# Patient Record
Sex: Female | Born: 1955 | Race: White | Hispanic: No | Marital: Married | Smoking: Current every day smoker
Health system: Southern US, Community
[De-identification: ages and names within clinical notes are randomized; demographics above are authoritative.]

## PROBLEM LIST (undated history)

## (undated) DIAGNOSIS — M549 Dorsalgia, unspecified: Secondary | ICD-10-CM

## (undated) DIAGNOSIS — Z8709 Personal history of other diseases of the respiratory system: Secondary | ICD-10-CM

## (undated) DIAGNOSIS — K219 Gastro-esophageal reflux disease without esophagitis: Secondary | ICD-10-CM

## (undated) DIAGNOSIS — E876 Hypokalemia: Secondary | ICD-10-CM

## (undated) DIAGNOSIS — G8929 Other chronic pain: Secondary | ICD-10-CM

## (undated) DIAGNOSIS — E785 Hyperlipidemia, unspecified: Secondary | ICD-10-CM

## (undated) DIAGNOSIS — L309 Dermatitis, unspecified: Secondary | ICD-10-CM

## (undated) DIAGNOSIS — I341 Nonrheumatic mitral (valve) prolapse: Secondary | ICD-10-CM

## (undated) DIAGNOSIS — I471 Supraventricular tachycardia, unspecified: Secondary | ICD-10-CM

## (undated) DIAGNOSIS — F419 Anxiety disorder, unspecified: Secondary | ICD-10-CM

## (undated) DIAGNOSIS — Z8632 Personal history of gestational diabetes: Secondary | ICD-10-CM

## (undated) DIAGNOSIS — I10 Essential (primary) hypertension: Secondary | ICD-10-CM

## (undated) DIAGNOSIS — L509 Urticaria, unspecified: Secondary | ICD-10-CM

## (undated) DIAGNOSIS — D751 Secondary polycythemia: Secondary | ICD-10-CM

## (undated) DIAGNOSIS — R232 Flushing: Secondary | ICD-10-CM

## (undated) HISTORY — DX: Gastro-esophageal reflux disease without esophagitis: K21.9

## (undated) HISTORY — DX: Hyperlipidemia, unspecified: E78.5

## (undated) HISTORY — DX: Dermatitis, unspecified: L30.9

## (undated) HISTORY — DX: Urticaria, unspecified: L50.9

## (undated) HISTORY — DX: Hypokalemia: E87.6

## (undated) HISTORY — DX: Flushing: R23.2

## (undated) HISTORY — DX: Supraventricular tachycardia, unspecified: I47.10

## (undated) HISTORY — DX: Anxiety disorder, unspecified: F41.9

## (undated) HISTORY — DX: Other chronic pain: G89.29

## (undated) HISTORY — PX: OTHER SURGICAL HISTORY: SHX169

## (undated) HISTORY — DX: Secondary polycythemia: D75.1

## (undated) HISTORY — DX: Personal history of gestational diabetes: Z86.32

## (undated) HISTORY — DX: Dorsalgia, unspecified: M54.9

## (undated) HISTORY — DX: Nonrheumatic mitral (valve) prolapse: I34.1

## (undated) HISTORY — DX: Personal history of other diseases of the respiratory system: Z87.09

## (undated) HISTORY — DX: Essential (primary) hypertension: I10

## (undated) HISTORY — DX: Supraventricular tachycardia: I47.1

---

## 2001-11-09 ENCOUNTER — Encounter: Payer: Self-pay | Admitting: Emergency Medicine

## 2001-11-09 ENCOUNTER — Emergency Department (HOSPITAL_COMMUNITY): Admission: EM | Admit: 2001-11-09 | Discharge: 2001-11-09 | Payer: Self-pay | Admitting: Emergency Medicine

## 2001-12-21 ENCOUNTER — Encounter: Payer: Self-pay | Admitting: Emergency Medicine

## 2001-12-21 ENCOUNTER — Emergency Department (HOSPITAL_COMMUNITY): Admission: EM | Admit: 2001-12-21 | Discharge: 2001-12-21 | Payer: Self-pay | Admitting: Emergency Medicine

## 2001-12-24 ENCOUNTER — Encounter: Payer: Self-pay | Admitting: Emergency Medicine

## 2001-12-24 ENCOUNTER — Emergency Department (HOSPITAL_COMMUNITY): Admission: EM | Admit: 2001-12-24 | Discharge: 2001-12-24 | Payer: Self-pay | Admitting: Emergency Medicine

## 2002-07-30 ENCOUNTER — Encounter: Payer: Self-pay | Admitting: Emergency Medicine

## 2002-07-30 ENCOUNTER — Emergency Department (HOSPITAL_COMMUNITY): Admission: EM | Admit: 2002-07-30 | Discharge: 2002-07-30 | Payer: Self-pay | Admitting: Emergency Medicine

## 2002-10-07 ENCOUNTER — Encounter (INDEPENDENT_AMBULATORY_CARE_PROVIDER_SITE_OTHER): Payer: Self-pay

## 2002-10-07 ENCOUNTER — Other Ambulatory Visit: Admission: RE | Admit: 2002-10-07 | Discharge: 2002-10-07 | Payer: Self-pay | Admitting: *Deleted

## 2002-10-07 ENCOUNTER — Encounter: Admission: RE | Admit: 2002-10-07 | Discharge: 2002-10-07 | Payer: Self-pay | Admitting: Family Medicine

## 2003-09-01 ENCOUNTER — Emergency Department (HOSPITAL_COMMUNITY): Admission: EM | Admit: 2003-09-01 | Discharge: 2003-09-01 | Payer: Self-pay | Admitting: *Deleted

## 2003-09-16 ENCOUNTER — Emergency Department (HOSPITAL_COMMUNITY): Admission: EM | Admit: 2003-09-16 | Discharge: 2003-09-16 | Payer: Self-pay | Admitting: Emergency Medicine

## 2004-12-23 ENCOUNTER — Emergency Department (HOSPITAL_COMMUNITY): Admission: EM | Admit: 2004-12-23 | Discharge: 2004-12-23 | Payer: Self-pay | Admitting: Emergency Medicine

## 2005-01-15 ENCOUNTER — Ambulatory Visit: Payer: Self-pay | Admitting: Hospitalist

## 2005-02-05 ENCOUNTER — Ambulatory Visit: Payer: Self-pay | Admitting: Hospitalist

## 2005-02-19 ENCOUNTER — Ambulatory Visit: Payer: Self-pay | Admitting: Internal Medicine

## 2005-03-25 ENCOUNTER — Ambulatory Visit: Payer: Self-pay | Admitting: Hospitalist

## 2005-03-25 ENCOUNTER — Ambulatory Visit (HOSPITAL_COMMUNITY): Admission: RE | Admit: 2005-03-25 | Discharge: 2005-03-25 | Payer: Self-pay | Admitting: Hospitalist

## 2005-04-08 ENCOUNTER — Ambulatory Visit: Payer: Self-pay | Admitting: Hospitalist

## 2005-04-11 ENCOUNTER — Ambulatory Visit (HOSPITAL_COMMUNITY): Admission: RE | Admit: 2005-04-11 | Discharge: 2005-04-11 | Payer: Self-pay | Admitting: Sports Medicine

## 2005-04-16 ENCOUNTER — Ambulatory Visit: Payer: Self-pay | Admitting: Hospitalist

## 2005-04-17 ENCOUNTER — Ambulatory Visit: Payer: Self-pay | Admitting: Internal Medicine

## 2005-05-06 ENCOUNTER — Ambulatory Visit: Payer: Self-pay | Admitting: Hospitalist

## 2005-08-06 ENCOUNTER — Emergency Department (HOSPITAL_COMMUNITY): Admission: EM | Admit: 2005-08-06 | Discharge: 2005-08-06 | Payer: Self-pay | Admitting: Family Medicine

## 2005-08-08 ENCOUNTER — Ambulatory Visit: Payer: Self-pay | Admitting: Hospitalist

## 2005-09-04 ENCOUNTER — Ambulatory Visit: Payer: Self-pay | Admitting: Internal Medicine

## 2005-10-03 ENCOUNTER — Ambulatory Visit: Payer: Self-pay | Admitting: Internal Medicine

## 2005-10-26 DIAGNOSIS — I059 Rheumatic mitral valve disease, unspecified: Secondary | ICD-10-CM | POA: Insufficient documentation

## 2005-10-26 DIAGNOSIS — D751 Secondary polycythemia: Secondary | ICD-10-CM

## 2005-10-26 DIAGNOSIS — K219 Gastro-esophageal reflux disease without esophagitis: Secondary | ICD-10-CM | POA: Insufficient documentation

## 2005-10-26 DIAGNOSIS — F17200 Nicotine dependence, unspecified, uncomplicated: Secondary | ICD-10-CM | POA: Insufficient documentation

## 2005-10-26 DIAGNOSIS — E876 Hypokalemia: Secondary | ICD-10-CM | POA: Insufficient documentation

## 2005-10-26 DIAGNOSIS — I10 Essential (primary) hypertension: Secondary | ICD-10-CM | POA: Insufficient documentation

## 2005-12-09 ENCOUNTER — Ambulatory Visit: Payer: Self-pay | Admitting: Internal Medicine

## 2005-12-09 ENCOUNTER — Ambulatory Visit (HOSPITAL_COMMUNITY): Admission: RE | Admit: 2005-12-09 | Discharge: 2005-12-09 | Payer: Self-pay | Admitting: Internal Medicine

## 2005-12-09 ENCOUNTER — Encounter (INDEPENDENT_AMBULATORY_CARE_PROVIDER_SITE_OTHER): Payer: Self-pay | Admitting: Infectious Diseases

## 2005-12-09 LAB — CONVERTED CEMR LAB
BUN: 7 mg/dL (ref 6–23)
CO2: 30 meq/L (ref 19–32)
Chloride: 99 meq/L (ref 96–112)
Creatinine, Ser: 0.76 mg/dL (ref 0.40–1.20)
Glucose, Bld: 114 mg/dL — ABNORMAL HIGH (ref 70–99)

## 2005-12-26 ENCOUNTER — Ambulatory Visit: Payer: Self-pay | Admitting: *Deleted

## 2006-01-30 DIAGNOSIS — F341 Dysthymic disorder: Secondary | ICD-10-CM

## 2006-04-06 ENCOUNTER — Emergency Department (HOSPITAL_COMMUNITY): Admission: EM | Admit: 2006-04-06 | Discharge: 2006-04-07 | Payer: Self-pay | Admitting: Emergency Medicine

## 2006-04-28 ENCOUNTER — Encounter (INDEPENDENT_AMBULATORY_CARE_PROVIDER_SITE_OTHER): Payer: Self-pay | Admitting: Internal Medicine

## 2006-04-28 ENCOUNTER — Ambulatory Visit: Payer: Self-pay | Admitting: Internal Medicine

## 2006-04-28 LAB — CONVERTED CEMR LAB
CO2: 26 meq/L (ref 19–32)
Calcium: 9.5 mg/dL (ref 8.4–10.5)
Chloride: 92 meq/L — ABNORMAL LOW (ref 96–112)
Glucose, Bld: 79 mg/dL (ref 70–99)

## 2006-04-30 ENCOUNTER — Telehealth (INDEPENDENT_AMBULATORY_CARE_PROVIDER_SITE_OTHER): Payer: Self-pay | Admitting: Internal Medicine

## 2006-05-08 ENCOUNTER — Ambulatory Visit: Payer: Self-pay | Admitting: Internal Medicine

## 2006-05-08 ENCOUNTER — Encounter (INDEPENDENT_AMBULATORY_CARE_PROVIDER_SITE_OTHER): Payer: Self-pay | Admitting: Internal Medicine

## 2006-05-08 LAB — CONVERTED CEMR LAB
BUN: 6 mg/dL (ref 6–23)
CO2: 29 meq/L (ref 19–32)
Calcium: 9.3 mg/dL (ref 8.4–10.5)
Chloride: 102 meq/L (ref 96–112)
Creatinine, Ser: 0.75 mg/dL (ref 0.40–1.20)
Glucose, Bld: 89 mg/dL (ref 70–99)
Potassium: 4.4 meq/L (ref 3.5–5.3)
Sodium: 135 meq/L (ref 135–145)

## 2006-09-02 ENCOUNTER — Encounter: Payer: Self-pay | Admitting: Infectious Disease

## 2006-09-02 ENCOUNTER — Encounter (INDEPENDENT_AMBULATORY_CARE_PROVIDER_SITE_OTHER): Payer: Self-pay | Admitting: Internal Medicine

## 2006-09-02 ENCOUNTER — Ambulatory Visit: Payer: Self-pay | Admitting: Infectious Disease

## 2006-09-02 ENCOUNTER — Ambulatory Visit (HOSPITAL_COMMUNITY): Admission: RE | Admit: 2006-09-02 | Discharge: 2006-09-02 | Payer: Self-pay | Admitting: Infectious Disease

## 2006-09-02 ENCOUNTER — Telehealth: Payer: Self-pay | Admitting: *Deleted

## 2006-09-02 ENCOUNTER — Ambulatory Visit: Payer: Self-pay | Admitting: Surgery

## 2006-09-02 DIAGNOSIS — R609 Edema, unspecified: Secondary | ICD-10-CM

## 2006-09-02 LAB — CONVERTED CEMR LAB
BUN: 10 mg/dL (ref 6–23)
Basophils Relative: 0 % (ref 0–1)
Calcium: 9.6 mg/dL (ref 8.4–10.5)
Chloride: 96 meq/L (ref 96–112)
Eosinophils Relative: 0 % (ref 0–5)
Lymphocytes Relative: 18 % (ref 12–46)
MCV: 94.3 fL (ref 78.0–100.0)
Monocytes Absolute: 0.5 10*3/uL (ref 0.2–0.7)
Neutro Abs: 6 10*3/uL (ref 1.7–7.7)
Platelets: 259 10*3/uL (ref 150–400)
Potassium: 3.6 meq/L (ref 3.5–5.3)
Sodium: 134 meq/L — ABNORMAL LOW (ref 135–145)
Total CK: 71 units/L (ref 7–177)

## 2006-10-06 ENCOUNTER — Ambulatory Visit: Payer: Self-pay | Admitting: Internal Medicine

## 2006-10-23 ENCOUNTER — Ambulatory Visit: Payer: Self-pay | Admitting: Hospitalist

## 2006-10-23 DIAGNOSIS — R05 Cough: Secondary | ICD-10-CM | POA: Insufficient documentation

## 2006-11-18 ENCOUNTER — Ambulatory Visit: Payer: Self-pay | Admitting: *Deleted

## 2006-12-25 ENCOUNTER — Ambulatory Visit: Payer: Self-pay | Admitting: Hospitalist

## 2006-12-25 ENCOUNTER — Encounter (INDEPENDENT_AMBULATORY_CARE_PROVIDER_SITE_OTHER): Payer: Self-pay | Admitting: *Deleted

## 2006-12-25 ENCOUNTER — Ambulatory Visit (HOSPITAL_COMMUNITY): Admission: RE | Admit: 2006-12-25 | Discharge: 2006-12-25 | Payer: Self-pay | Admitting: Internal Medicine

## 2006-12-25 ENCOUNTER — Telehealth: Payer: Self-pay | Admitting: *Deleted

## 2007-01-16 ENCOUNTER — Ambulatory Visit: Payer: Self-pay | Admitting: Internal Medicine

## 2007-01-16 ENCOUNTER — Encounter (INDEPENDENT_AMBULATORY_CARE_PROVIDER_SITE_OTHER): Payer: Self-pay | Admitting: Internal Medicine

## 2007-01-18 LAB — CONVERTED CEMR LAB
BUN: 7 mg/dL (ref 6–23)
CO2: 24 meq/L (ref 19–32)
Cholesterol: 265 mg/dL — ABNORMAL HIGH (ref 0–200)
Creatinine, Ser: 0.69 mg/dL (ref 0.40–1.20)
Glucose, Bld: 80 mg/dL (ref 70–99)
LDL Cholesterol: 125 mg/dL — ABNORMAL HIGH (ref 0–99)
Total CHOL/HDL Ratio: 2.1
VLDL: 14 mg/dL (ref 0–40)

## 2007-05-01 ENCOUNTER — Emergency Department (HOSPITAL_COMMUNITY): Admission: EM | Admit: 2007-05-01 | Discharge: 2007-05-01 | Payer: Self-pay | Admitting: Emergency Medicine

## 2007-05-08 ENCOUNTER — Ambulatory Visit: Payer: Self-pay | Admitting: Hospitalist

## 2007-05-08 DIAGNOSIS — Z8679 Personal history of other diseases of the circulatory system: Secondary | ICD-10-CM

## 2007-06-19 ENCOUNTER — Encounter (INDEPENDENT_AMBULATORY_CARE_PROVIDER_SITE_OTHER): Payer: Self-pay | Admitting: Internal Medicine

## 2007-06-19 ENCOUNTER — Ambulatory Visit: Payer: Self-pay | Admitting: Internal Medicine

## 2007-06-19 DIAGNOSIS — E785 Hyperlipidemia, unspecified: Secondary | ICD-10-CM | POA: Insufficient documentation

## 2007-06-19 LAB — CONVERTED CEMR LAB
ALT: 14 units/L (ref 0–35)
Albumin: 4.3 g/dL (ref 3.5–5.2)
Alkaline Phosphatase: 61 units/L (ref 39–117)
BUN: 11 mg/dL (ref 6–23)
CO2: 26 meq/L (ref 19–32)
Calcium: 9.5 mg/dL (ref 8.4–10.5)
Chloride: 101 meq/L (ref 96–112)
Eosinophils Absolute: 0.2 10*3/uL (ref 0.0–0.7)
Glucose, Bld: 82 mg/dL (ref 70–99)
Lymphs Abs: 2.5 10*3/uL (ref 0.7–4.0)
Monocytes Absolute: 0.6 10*3/uL (ref 0.1–1.0)
Monocytes Relative: 7 % (ref 3–12)
RBC: 4.8 M/uL (ref 3.87–5.11)
TSH: 1.684 microintl units/mL (ref 0.350–5.50)
Total Bilirubin: 0.5 mg/dL (ref 0.3–1.2)
WBC: 8.8 10*3/uL (ref 4.0–10.5)

## 2007-08-24 ENCOUNTER — Telehealth (INDEPENDENT_AMBULATORY_CARE_PROVIDER_SITE_OTHER): Payer: Self-pay | Admitting: *Deleted

## 2007-09-29 ENCOUNTER — Telehealth (INDEPENDENT_AMBULATORY_CARE_PROVIDER_SITE_OTHER): Payer: Self-pay | Admitting: Internal Medicine

## 2007-09-29 ENCOUNTER — Encounter (INDEPENDENT_AMBULATORY_CARE_PROVIDER_SITE_OTHER): Payer: Self-pay | Admitting: Internal Medicine

## 2007-10-02 ENCOUNTER — Encounter: Payer: Self-pay | Admitting: Licensed Clinical Social Worker

## 2007-10-02 ENCOUNTER — Ambulatory Visit: Payer: Self-pay | Admitting: Internal Medicine

## 2007-10-02 DIAGNOSIS — T7411XA Adult physical abuse, confirmed, initial encounter: Secondary | ICD-10-CM

## 2007-10-07 ENCOUNTER — Telehealth (INDEPENDENT_AMBULATORY_CARE_PROVIDER_SITE_OTHER): Payer: Self-pay | Admitting: Internal Medicine

## 2007-10-09 ENCOUNTER — Encounter: Payer: Self-pay | Admitting: Licensed Clinical Social Worker

## 2008-02-03 ENCOUNTER — Ambulatory Visit: Payer: Self-pay | Admitting: Internal Medicine

## 2008-02-03 ENCOUNTER — Encounter: Payer: Self-pay | Admitting: Internal Medicine

## 2008-02-05 LAB — CONVERTED CEMR LAB
BUN: 6 mg/dL (ref 6–23)
Calcium: 9.6 mg/dL (ref 8.4–10.5)
Creatinine, Ser: 0.74 mg/dL (ref 0.40–1.20)
Potassium: 3.4 meq/L — ABNORMAL LOW (ref 3.5–5.3)
Sodium: 140 meq/L (ref 135–145)

## 2008-02-15 ENCOUNTER — Telehealth (INDEPENDENT_AMBULATORY_CARE_PROVIDER_SITE_OTHER): Payer: Self-pay | Admitting: Internal Medicine

## 2008-02-24 ENCOUNTER — Ambulatory Visit: Payer: Self-pay | Admitting: Internal Medicine

## 2008-02-24 LAB — CONVERTED CEMR LAB
BUN: 9 mg/dL (ref 6–23)
Chloride: 99 meq/L (ref 96–112)
Glucose, Bld: 103 mg/dL — ABNORMAL HIGH (ref 70–99)
Potassium: 4.3 meq/L (ref 3.5–5.3)

## 2008-03-31 ENCOUNTER — Encounter (INDEPENDENT_AMBULATORY_CARE_PROVIDER_SITE_OTHER): Payer: Self-pay | Admitting: Internal Medicine

## 2008-03-31 ENCOUNTER — Ambulatory Visit: Payer: Self-pay | Admitting: Internal Medicine

## 2008-03-31 LAB — CONVERTED CEMR LAB
BUN: 13 mg/dL (ref 6–23)
CO2: 25 meq/L (ref 19–32)
Cholesterol: 206 mg/dL — ABNORMAL HIGH (ref 0–200)
Creatinine, Ser: 0.72 mg/dL (ref 0.40–1.20)
Glucose, Bld: 103 mg/dL — ABNORMAL HIGH (ref 70–99)
HDL: 81 mg/dL (ref >39)
Total CHOL/HDL Ratio: 2.5
Triglycerides: 101 mg/dL (ref <150)

## 2008-04-18 ENCOUNTER — Telehealth (INDEPENDENT_AMBULATORY_CARE_PROVIDER_SITE_OTHER): Payer: Self-pay | Admitting: Internal Medicine

## 2008-10-05 ENCOUNTER — Ambulatory Visit: Payer: Self-pay | Admitting: Infectious Diseases

## 2008-10-07 ENCOUNTER — Telehealth: Payer: Self-pay | Admitting: Licensed Clinical Social Worker

## 2008-10-14 LAB — CONVERTED CEMR LAB
BUN: 14 mg/dL (ref 6–23)
CO2: 27 meq/L (ref 19–32)
Calcium: 9.5 mg/dL (ref 8.4–10.5)
Potassium: 3.4 meq/L — ABNORMAL LOW (ref 3.5–5.3)

## 2009-01-11 ENCOUNTER — Telehealth: Payer: Self-pay | Admitting: Internal Medicine

## 2009-01-19 ENCOUNTER — Telehealth: Payer: Self-pay | Admitting: *Deleted

## 2009-02-02 ENCOUNTER — Ambulatory Visit (HOSPITAL_COMMUNITY): Admission: RE | Admit: 2009-02-02 | Discharge: 2009-02-02 | Payer: Self-pay | Admitting: Internal Medicine

## 2009-05-02 ENCOUNTER — Telehealth (INDEPENDENT_AMBULATORY_CARE_PROVIDER_SITE_OTHER): Payer: Self-pay | Admitting: Internal Medicine

## 2009-05-22 ENCOUNTER — Telehealth (INDEPENDENT_AMBULATORY_CARE_PROVIDER_SITE_OTHER): Payer: Self-pay | Admitting: Internal Medicine

## 2009-06-01 ENCOUNTER — Ambulatory Visit: Payer: Self-pay | Admitting: Internal Medicine

## 2009-10-27 ENCOUNTER — Telehealth: Payer: Self-pay | Admitting: Internal Medicine

## 2009-11-22 ENCOUNTER — Ambulatory Visit: Payer: Self-pay | Admitting: Internal Medicine

## 2009-11-22 LAB — CONVERTED CEMR LAB
Calcium: 9.4 mg/dL (ref 8.4–10.5)
Chloride: 99 meq/L (ref 96–112)
Cholesterol: 244 mg/dL — ABNORMAL HIGH (ref 0–200)
Creatinine, Ser: 0.91 mg/dL (ref 0.40–1.20)
HCT: 45.6 % (ref 36.0–46.0)
Potassium: 3.7 meq/L (ref 3.5–5.3)
RBC: 4.82 M/uL (ref 3.87–5.11)
Sodium: 136 meq/L (ref 135–145)
Total CHOL/HDL Ratio: 2.5
Triglycerides: 131 mg/dL (ref <150)
WBC: 9.7 10*3/uL (ref 4.0–10.5)

## 2010-01-28 ENCOUNTER — Encounter: Payer: Self-pay | Admitting: Internal Medicine

## 2010-02-06 NOTE — Progress Notes (Signed)
Summary: REfill/gh  Phone Note Refill Request Message from:  Patient on May 22, 2009 12:57 PM  Refills Requested: Medication #1:  COZAAR 50 MG  TABS Take 1 tablet by mouth once a day Pt 's last office visit  and labs were 09/27/1999.  Pt says she has been out of the medication for 3 days.    Method Requested: Electronic Initial call taken by: Angelina Ok RN,  May 22, 2009 12:57 PM  Follow-up for Phone Call        Rx written.She does need to make fu appt in [facility] though Follow-up by: Acey Lav MD,  May 22, 2009 3:09 PM    Prescriptions: COZAAR 50 MG  TABS (LOSARTAN POTASSIUM) Take 1 tablet by mouth once a day  #30 x 11   Entered and Authorized by:   Acey Lav MD   Signed by:   Paulette Blanch Dam MD on 05/22/2009   Method used:   Electronically to        [facility] (retail)       [address]       [place], [place]  [address]       Ph: [phone]       Fax: [phone]   RxID:   [id number]

## 2010-02-06 NOTE — Progress Notes (Signed)
Summary: refill/gg  Phone Note Refill Request  on May 02, 2009 12:01 PM  Refills Requested: Medication #1:  MAXZIDE-25 37.5-25 MG TABS Take 1 tablet by mouth once a day   Last Refilled: 03/31/2009  Method Requested: Electronic Initial call taken by: Merrie Roof RN,  May 02, 2009 12:01 PM  Follow-up for Phone Call       Follow-up by: Silvestre Gunner MD,  May 02, 2009 2:15 PM    Prescriptions: MAXZIDE-25 37.5-25 MG TABS (TRIAMTERENE-HCTZ) Take 1 tablet by mouth once a day  #30 x 5   Entered and Authorized by:   Silvestre Gunner MD   Signed by:   Silvestre Gunner MD on 05/02/2009   Method used:   Electronically to        [facility] (retail)       [address]       [place], [place]  [address]       Ph: [phone]       Fax: [phone]   RxID:   [id number]
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
## 2010-02-06 NOTE — Progress Notes (Signed)
Summary: med refill/gp  Phone Note Refill Request Message from:  Fax from Pharmacy on January 11, 2009 2:25 PM  Refills Requested: Medication #1:  ATIVAN 1 MG TABS Take 1 tablet by mouth one time a day as needed   Supply Requested: 3 months   Last Refilled: 08/24/2008  Method Requested: Electronic Initial call taken by: Chinita Pester RN,  January 11, 2009 2:25 PM  Follow-up for Phone Call        Last refilled in August per our records after an increase in her paxil dose.  Presumably this SSRI dose increase was effective and she only needed 60 ativan tablets in four months.  I will therefore decrease the dose to ativan 1 mg by mouth Q24H as needed anxiety, dispense # 30.  She will need a follow-up appointment with Dr. Tobie Lords within the next 2 months to assess if the ativan is still necessary or could be further tapered down.  Further refills will require a visit for evaluation.  Please schedule this follow-up appointment. Follow-up by: Doneen Poisson MD,  January 11, 2009 3:02 PM  Additional Follow-up for Phone Call Additional follow up Details #1::        Flag sent to [facility] for an appt. [facility] Additional Follow-up by: Chinita Pester RN,  January 11, 2009 5:21 PM    New/Updated Medications: LORAZEPAM 1 MG TABS (LORAZEPAM) 1 tablet once a day as needed for breakthrough anxiety Prescriptions: LORAZEPAM 1 MG TABS (LORAZEPAM) 1 tablet once a day as needed for breakthrough anxiety  #30 x 1   Entered and Authorized by:   Doneen Poisson MD   Signed by:   Chinita Pester RN on 01/11/2009   Method used:   Printed then [facility]ed [facility] ...       [facility]  [address] [id number]       [address]       [place], [place]  [address]       Ph: [phone] or [phone]       Fax: [phone]   RxID:   [id number]
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
## 2010-02-06 NOTE — Progress Notes (Signed)
----   Converted from flag ---- ---- 01/19/2009 9:01 AM, Chinita Pester RN wrote:   ---- 01/18/2009 3:47 PM, Shon Hough wrote: I will keep this appointment flag until I get a sch.  ---- 01/18/2009 3:29 PM, Chinita Pester RN wrote: What appt.?  ---- 01/18/2009 3:24 PM, Shon Hough wrote: Will keep this appt until the Feb sch has been  posted to sch/cfb  ---- 01/11/2009 5:20 PM, Chinita Pester RN wrote: Linton Flemings needs an appt w/PCP within next 2 months to evaluate her med. Ativan per Dr. Josem Kaufmann. ------------------------------

## 2010-02-06 NOTE — Progress Notes (Signed)
Summary: refill/gg  Phone Note Refill Request  on October 27, 2009 4:14 PM  Refills Requested: Medication #1:  MAXZIDE-25 37.5-25 MG TABS Take 1 tablet by mouth once a day *Pt is out of med Last visit 5/20   Method Requested: Electronic Initial call taken by: Merrie Roof RN,  October 27, 2009 4:14 PM  Follow-up for Phone Call        Last seen in may and 3 month F/U requested but not sch. I sent a flag to Ms Lissa Hoard to schedule an appt.  Follow-up by: Blanch Media MD,  October 27, 2009 4:15 PM    Prescriptions: Joseph Pierini 37.5-25 MG TABS (TRIAMTERENE-HCTZ) Take 1 tablet by mouth once a day  #30 x 5   Entered and Authorized by:   Blanch Media MD   Signed by:   Blanch Media MD on 10/27/2009   Method used:   Electronically to        [facility] (retail)       [address]       [place], [place]  [address]       Ph: [phone]       Fax: [phone]   RxID:   [id number]

## 2010-04-30 ENCOUNTER — Other Ambulatory Visit: Payer: Self-pay | Admitting: Internal Medicine

## 2010-05-23 ENCOUNTER — Ambulatory Visit (INDEPENDENT_AMBULATORY_CARE_PROVIDER_SITE_OTHER): Payer: Self-pay | Admitting: Internal Medicine

## 2010-05-23 ENCOUNTER — Encounter: Payer: Self-pay | Admitting: Internal Medicine

## 2010-05-23 VITALS — BP 119/76 | HR 83 | Temp 99.5°F | Ht 64.0 in | Wt 156.9 lb

## 2010-05-23 DIAGNOSIS — M549 Dorsalgia, unspecified: Secondary | ICD-10-CM

## 2010-05-23 DIAGNOSIS — G8929 Other chronic pain: Secondary | ICD-10-CM

## 2010-05-23 DIAGNOSIS — K219 Gastro-esophageal reflux disease without esophagitis: Secondary | ICD-10-CM

## 2010-05-23 DIAGNOSIS — F341 Dysthymic disorder: Secondary | ICD-10-CM

## 2010-05-23 DIAGNOSIS — I10 Essential (primary) hypertension: Secondary | ICD-10-CM

## 2010-05-23 MED ORDER — NAPROXEN 375 MG PO TABS: 375.0000 mg | ORAL_TABLET | Freq: Two times a day (BID) | ORAL | Status: DC | PRN

## 2010-05-23 MED ORDER — LOSARTAN POTASSIUM 50 MG PO TABS: 50.0000 mg | ORAL_TABLET | Freq: Every day | ORAL | Status: DC

## 2010-05-23 MED ORDER — OMEPRAZOLE 20 MG PO CPDR: 20.0000 mg | DELAYED_RELEASE_CAPSULE | Freq: Every day | ORAL | Status: DC

## 2010-05-23 MED ORDER — LORAZEPAM 0.5 MG PO TABS: 0.5000 mg | ORAL_TABLET | Freq: Two times a day (BID) | ORAL | Status: DC | PRN

## 2010-05-23 NOTE — Assessment & Plan Note (Signed)
Well controlled on paxil and will continue this.

## 2010-05-23 NOTE — Assessment & Plan Note (Signed)
Will give naproxen prn and advised continue exercise and gave her education material.

## 2010-05-23 NOTE — Patient Instructions (Signed)
Please take all your medications as instructed in your instructions.   Please call the Clinic for appointment or go to Emergency Department if your symptoms do not improve or get worse.  Back Pain - Chronic Back pain seldom lasts longer than 3 months. Long standing back pain can be caused by a ruptured disc. About half the time the exact cause cannot be found. Arthritis, osteoporosis, tumors, infections, previous back surgery, and other causes may be involved. Anxiety and depression are common factors. A ruptured disc often causes sciatica. This is a pain traveling from the low back down the back of the leg. This is due to irritation of the sciatic nerve. Weakness or numbness in the legs or feet and loss of normal bladder control are more serious signs of disc rupture. You should contact your doctor immediately if these symptoms develop. Avoid bending, heavy lifting, prolonged sitting, and activities which make the problem worse. Continue normal activity as much as possible. Take brief periods of rest throughout the day to reduce your pain during bad periods. A back exercise rehabilitation program can be helpful in reducing symptoms and preventing more pain. Muscle relaxants are sometimes used. Using narcotic pain medicine for long term pain is discouraged. Addiction is a possible outcome.  Surgery is considered only when symptoms do not improve with bed rest and other conservative treatment. You should see your caregiver if problems get worse. SEEK IMMEDIATE MEDICAL CARE IF:  You have marked weakness or numbness in one of your legs.   You have trouble controlling your bladder or bowels.   You develop nausea, vomiting, abdominal pain, shortness of breath or fainting.   You have severe pain not relieved with medications.  Document Released: 02/01/2004 Document Re-Released: 03/22/2008  [facility] Patient Information 2011 [place], [place]. hours, or as needed.   After you are improved and more  active, it may help to apply heat for 30 minutes before activities.  See your caregiver if you are having pain that lasts longer than expected. Your caregiver can advise appropriate exercises and/or therapy if needed. With conditioning, most back problems can be avoided. SEEK IMMEDIATE MEDICAL CARE IF:  You have numbness, tingling, weakness, or problems with the use of your arms or legs.   You experience severe back pain not relieved with medicines.   There is a change in bowel or bladder control.   You have increasing pain in any area of the body, including your belly (abdomen).   You notice shortness of breath, dizziness, or feel faint.   You feel sick to your stomach (nauseous), are throwing up (vomiting), or become sweaty.   You notice discoloration of your toes or legs, or your feet get very cold.   Your back pain is getting worse.   You have an oral temperature above 101, not controlled by medicine.  MAKE SURE YOU:   Understand these instructions.   Will watch your condition.   Will get help right away if you are not doing well or get worse.  Document Released: 10/03/2004 Document Re-Released: 03/20/2009 [facility] Patient Information 2011 [place], [place].

## 2010-05-23 NOTE — Assessment & Plan Note (Addendum)
Lab Results  Component Value Date   NA 136 11/22/2009   K 3.7 11/22/2009   CL 99 11/22/2009   CO2 25 11/22/2009   BUN 13 11/22/2009   CREATININE 0.91 11/22/2009    BP Readings from Last 3 Encounters:  05/23/10 119/76  11/22/09 132/87  06/01/09 118/89    BP well controlled, will refill meds for her and continue current regimen.

## 2010-05-23 NOTE — Assessment & Plan Note (Addendum)
Work well on prilosec, will continue this and refill.

## 2010-05-23 NOTE — Progress Notes (Signed)
  Subjective:    Patient ID: Catherine Gilmore, female    DOB: 1955-12-03, 55 y.o.   MRN: [id number]  HPI Patient is a 55 years old female with past medical history  as outlined here who comes to the Clinic for regular f/u and meds refill. She has been doing well, no c/o, including fever, chill, chest pain, shortness of breath, hemoptysis, abdominal pain, nausea, vomiting, diarrhea, melena, dysuria, significant weight change. Her acid reflux is well controlled by prilosec. Denies recent smoking, alcohol or drug abuse. Has been taking all his medications as instructed.       Review of Systems Per HPI.  Current Outpatient Medications Current Outpatient Prescriptions  Medication Sig Dispense Refill  . LORazepam (ATIVAN) 0.5 MG tablet Take 1 tablet (0.5 mg total) by mouth every 12 (twelve) hours as needed.  30 tablet  2  . losartan (COZAAR) 50 MG tablet Take 1 tablet (50 mg total) by mouth daily.  30 tablet  11  . omeprazole (PRILOSEC) 20 MG capsule Take 1 capsule (20 mg total) by mouth daily.  30 capsule  8  . PARoxetine (PAXIL) 20 MG tablet Take 20 mg by mouth every morning.        Marland Kitchen DISCONTD: LORazepam (ATIVAN) 0.5 MG tablet Take 0.5 mg by mouth every 12 (twelve) hours as needed.        Marland Kitchen DISCONTD: losartan (COZAAR) 50 MG tablet Take 50 mg by mouth daily.        Marland Kitchen DISCONTD: omeprazole (PRILOSEC) 20 MG capsule Take 20 mg by mouth daily.        . naproxen (NAPROSYN) 375 MG tablet Take 1 tablet (375 mg total) by mouth 2 (two) times daily as needed.  60 tablet  2  . triamterene-hydrochlorothiazide (MAXZIDE-25) 37.5-25 MG per tablet TAKE ONE TABLET BY MOUTH EVERY DAY  30 tablet  5    Allergies Lisinopril  Past Medical History  Diagnosis Date  . Hyperlipidemia   . GERD (gastroesophageal reflux disease)   . Hypertension   . Anxiety   . Supraventricular tachycardia, paroxysmal   . Mitral valve prolapse   . Back pain, chronic   . Polycythemia     No past surgical history on file.       Objective:   Physical Exam General: Vital signs reviewed and noted. Well-developed, well-nourished, in no acute distress; alert, appropriate and cooperative throughout examination.  Head: Normocephalic, atraumatic.  Neck: No deformities, masses, or tenderness noted.  Lungs:  Normal respiratory effort. Clear to auscultation BL without crackles or wheezes.  Heart: RRR. S1 and S2 normal without gallop, murmur, or rubs.  Abdomen:  BS normoactive. Soft, Nondistended, non-tender.  No masses or organomegaly.  Extremities: No pretibial edema.                                  Assessment & Plan:

## 2010-05-25 NOTE — Group Therapy Note (Signed)
   NAME:  Catherine Gilmore, Catherine Gilmore NO.:  [id number]   MEDICAL RECORD NO.:  [id number]                   PATIENT TYPE:  OUT   LOCATION:  [facility]                           FACILITY:  [facility]   PHYSICIAN:  Ellis Parents, MD                 DATE OF BIRTH:  06-Jul-1955   DATE OF SERVICE:  10/07/2002                                    CLINIC NOTE   HISTORY OF PRESENT ILLNESS:  This 55 year old gravida 2, para 2 comes in  with the complaint of irregular bleeding of approximately two to three  months.  The patient's last normal menstrual period was in early August.  Following that she spotted a week later and then had some flow for about  seven days and intermittently spotted and is currently bleeding again today.  The patient's periods had been regular up until about one year ago and in  the past year she has noted that even though they have been cyclic there  have been some episodes where they have been 12 days but normally the  duration is about eight days and moderately heavy.  The patient has large  blood clots and needs tampon and pad.  She denies any dysmenorrhea.   PAST MEDICAL HISTORY:  1. She had a cardiac ablation for supraventricular tachycardia at [facility].  2. Had sterilization reversal for which she had two pregnancies after that.   SOCIAL HISTORY:  The patient does smoke.   PHYSICAL EXAMINATION:  PELVIC:  Vagina is clean and well supported.  The  cervix is very well epithelialized and normal.  The uterus is anterior and  normal size and mobile and both adnexa soft.  An endometrial biopsy was  performed.  The uterus was sounded to 9 cm in depth.   IMPRESSION AND PLAN:  The patient is given one cycle of Lo-Ovral 28 to begin  this coming Sunday.  She is to return in six weeks for a Pap smear and to  discuss future options if deemed necessary.  CBC is obtained.                                               Ellis Parents, MD    SA/MEDQ  D:  10/07/2002  T:  10/08/2002  Job:  [id number]

## 2010-07-29 ENCOUNTER — Encounter: Payer: Self-pay | Admitting: Internal Medicine

## 2010-10-02 LAB — POCT I-STAT, CHEM 8
BUN: 6
Calcium, Ion: 1.08 — ABNORMAL LOW
Chloride: 100
Creatinine, Ser: 0.9
Glucose, Bld: 106 — ABNORMAL HIGH
Potassium: 3.7

## 2010-10-02 LAB — DIFFERENTIAL
Basophils Relative: 0
Eosinophils Absolute: 0.4
Eosinophils Relative: 3
Lymphs Abs: 1.6
Monocytes Absolute: 0.6
Monocytes Relative: 5

## 2010-10-02 LAB — CBC
Platelets: 308
RDW: 12.6
WBC: 12.1 — ABNORMAL HIGH

## 2010-10-02 LAB — POCT CARDIAC MARKERS
CKMB, poc: <1 — ABNORMAL LOW
Troponin i, poc: <0.05

## 2010-10-02 LAB — T4, FREE: Free T4: 1.09

## 2010-11-26 ENCOUNTER — Other Ambulatory Visit: Payer: Self-pay | Admitting: *Deleted

## 2010-11-26 MED ORDER — TRIAMTERENE-HCTZ 37.5-25 MG PO TABS: 1.0000 | ORAL_TABLET | Freq: Every day | ORAL | Status: DC

## 2010-11-26 NOTE — Telephone Encounter (Signed)
Patient needs office visit before next refill

## 2010-12-06 NOTE — Telephone Encounter (Signed)
Message left for pt to schedule appointment in 30 days.

## 2011-01-24 ENCOUNTER — Encounter: Payer: Self-pay | Admitting: Internal Medicine

## 2011-01-24 ENCOUNTER — Ambulatory Visit (INDEPENDENT_AMBULATORY_CARE_PROVIDER_SITE_OTHER): Payer: Self-pay | Admitting: Internal Medicine

## 2011-01-24 VITALS — BP 140/89 | HR 88 | Temp 97.8°F | Ht 64.0 in | Wt 165.8 lb

## 2011-01-24 DIAGNOSIS — R002 Palpitations: Secondary | ICD-10-CM | POA: Insufficient documentation

## 2011-01-24 DIAGNOSIS — E785 Hyperlipidemia, unspecified: Secondary | ICD-10-CM

## 2011-01-24 DIAGNOSIS — D751 Secondary polycythemia: Secondary | ICD-10-CM

## 2011-01-24 DIAGNOSIS — F341 Dysthymic disorder: Secondary | ICD-10-CM

## 2011-01-24 DIAGNOSIS — Z23 Encounter for immunization: Secondary | ICD-10-CM

## 2011-01-24 DIAGNOSIS — F17200 Nicotine dependence, unspecified, uncomplicated: Secondary | ICD-10-CM

## 2011-01-24 DIAGNOSIS — E876 Hypokalemia: Secondary | ICD-10-CM

## 2011-01-24 DIAGNOSIS — I10 Essential (primary) hypertension: Secondary | ICD-10-CM

## 2011-01-24 MED ORDER — PAROXETINE HCL 20 MG PO TABS: 20.0000 mg | ORAL_TABLET | ORAL | Status: DC

## 2011-01-24 MED ORDER — LORAZEPAM 0.5 MG PO TABS: 0.5000 mg | ORAL_TABLET | Freq: Two times a day (BID) | ORAL | Status: DC | PRN

## 2011-01-24 NOTE — Assessment & Plan Note (Signed)
Will obtain CBC for followup

## 2011-01-24 NOTE — Progress Notes (Signed)
Ativan 0.5mg  rx faxed to [facility] pharmacy on [address].

## 2011-01-24 NOTE — Progress Notes (Signed)
Subjective:   Patient ID: Catherine Gilmore female   DOB: 05-28-1955 56 y.o.   MRN: [id number]  HPI: [name] is a 56 y.o. female with past medical history as outlined below who presented to the clinic for medication refill. Patient was last seen in May of 2012 in the clinic. Patient reports in 6-8 months she has been experiencing hot palpitation. It can last a few hours to a few days. Sometimes she is not experiencing any pain or 4 weeks. Denies any shortness of breath, dizziness, syncope or nausea no vomiting. Patient had history of SVTs status post cardioversion and ablation in 2000. Since then she has not experienced it. The patient lost her job one year ago.   patient was to continue smoking one half pack a day     Past Medical History  Diagnosis Date  . Hyperlipidemia   . GERD (gastroesophageal reflux disease)   . Hypertension   . Anxiety   . Supraventricular tachycardia, paroxysmal s/p ablation and cardioversion 01/1998, 02/1998  . Mitral valve prolapse   . Back pain, chronic   . Polycythemia   . History of gestational diabetes   . Hypokalemia   . Flushing    Current Outpatient Prescriptions  Medication Sig Dispense Refill  . LORazepam (ATIVAN) 0.5 MG tablet Take 1 tablet (0.5 mg total) by mouth every 12 (twelve) hours as needed.  30 tablet  2  . losartan (COZAAR) 50 MG tablet Take 1 tablet (50 mg total) by mouth daily.  30 tablet  11  . naproxen (NAPROSYN) 375 MG tablet Take 1 tablet (375 mg total) by mouth 2 (two) times daily as needed.  60 tablet  2  . omeprazole (PRILOSEC) 20 MG capsule Take 1 capsule (20 mg total) by mouth daily.  30 capsule  8  . PARoxetine (PAXIL) 20 MG tablet Take 20 mg by mouth every morning.        . triamterene-hydrochlorothiazide (MAXZIDE-25) 37.5-25 MG per tablet Take 1 each (1 tablet total) by mouth daily.  30 tablet  0   Family History  Problem Relation Age of Onset  . Cancer Mother   . Cirrhosis Father    History   Social History  .  Marital Status: Legally Separated    Spouse Name: N/A    Number of Children: N/A  . Years of Education: N/A   Social History Main Topics  . Smoking status: Current Everyday Smoker -- 1.5 packs/day  . Smokeless tobacco: None  . Alcohol Use: Yes     socially  . Drug Use: No  . Sexually Active: None   Other Topics Concern  . None   Social History Narrative  . None   Review of Systems: Constitutional: Denies fever, chills, diaphoresis, appetite change and fatigue.  Respiratory: Denies SOB, DOE, cough, chest tightness,  and wheezing.   Cardiovascular: Noted chest pain, palpitations but denies leg swelling.  Gastrointestinal: Denies nausea, vomiting, abdominal pain, diarrhea, constipation, blood in stool and abdominal distention.  Genitourinary: Denies dysuria, urgency, frequency, hematuria, flank pain and difficulty urinating.  Skin: Denies pallor, rash and wound.  Neurological: Denies dizziness, seizures, syncope, weakness, light-headedness, numbness and headaches.    Objective:  Physical Exam: Filed Vitals:   01/24/11 1145  BP: 140/89  Pulse: 88  Temp: 97.8 F (36.6 C)  TempSrc: Oral  Height: 5\' 4"  (1.626 m)  Weight: 165 lb 12.8 oz (75.206 kg)   Constitutional: Vital signs reviewed.  Patient is a well-developed and well-nourished  in no  acute distress and cooperative with exam. Alert and oriented x3.  Neck: Supple,  Cardiovascular: RRR, S1 normal, S2 normal, no MRG, pulses symmetric and intact bilaterally Pulmonary/Chest: CTAB, no wheezes, rales, or rhonchi Abdominal: Soft. Non-tender, non-distended, bowel sounds are normal,  Musculoskeletal: No joint deformities, erythema, or stiffness, ROM full and no nontender Hematology: no cervical,  Neurological: A&O x3,  no focal motor deficit, sensory intact to light touch bilaterally.  Skin: Warm, dry and intact. No rash, cyanosis, or clubbing.

## 2011-01-24 NOTE — Assessment & Plan Note (Signed)
Will obtain Bmet today for further evaluation and management

## 2011-01-24 NOTE — Assessment & Plan Note (Signed)
Patient had history of SVT status post cardioversion and ablation in 2000. EKG was obtained which showed normal sinus rhythm otherwise unremarkable. I will obtain TSH today and believe the patient should be referred to cardiology as soon as she will obtain her orange card for further evaluation and management including Holter monitor.

## 2011-01-24 NOTE — Patient Instructions (Signed)
Feel experiencing significant worsening symptoms with chest pain, dizziness, passing out please call the clinic for further advice or go to the emergency room

## 2011-01-24 NOTE — Assessment & Plan Note (Signed)
A refill of the Paroxetine and Ativan prescription today. During next office visit with PCP to evaluate patient's needs of this medication and possibly refer patient for counseling or psychiatry.

## 2011-01-24 NOTE — Assessment & Plan Note (Signed)
BP Readings from Last 3 Encounters:  01/24/11 130/89  05/23/10 119/76  11/22/09 132/87   Blood pressure well controlled continue current regimen. I have not refilled her medication today. I will review patient's kidney function and electrolytes first .

## 2011-01-24 NOTE — Assessment & Plan Note (Addendum)
Will obtain lipid panel for risk ratification  Update 01/24/10: LDL 140. Will start patient on pravastatin and patient need follow up LFT in 6-8 weeks.

## 2011-01-24 NOTE — Assessment & Plan Note (Signed)
  Patient was counseled on tobacco cessation. Patient is currently interested in quitting. I provided options in nicotine patches or gums but patient have to pay out of her pocket. Furthermore provided the patient [phone] Quit- Now number.  Patient reports she would like to contact them as soon as possible.

## 2011-01-25 LAB — CBC WITH DIFFERENTIAL/PLATELET
Eosinophils Relative: 2 % (ref 0–5)
Hemoglobin: 15.7 g/dL — ABNORMAL HIGH (ref 12.0–15.0)
Lymphocytes Relative: 22 % (ref 12–46)
Lymphs Abs: 2 10*3/uL (ref 0.7–4.0)
MCV: 92.1 fL (ref 78.0–100.0)
Monocytes Relative: 6 % (ref 3–12)
Platelets: 303 10*3/uL (ref 150–400)
RBC: 4.83 MIL/uL (ref 3.87–5.11)
WBC: 9.2 10*3/uL (ref 4.0–10.5)

## 2011-01-25 LAB — COMPREHENSIVE METABOLIC PANEL
ALT: 29 U/L (ref 0–35)
AST: 27 U/L (ref 0–37)
Albumin: 4.6 g/dL (ref 3.5–5.2)
CO2: 26 mEq/L (ref 19–32)
Calcium: 9.4 mg/dL (ref 8.4–10.5)
Chloride: 101 mEq/L (ref 96–112)
Creat: 0.85 mg/dL (ref 0.50–1.10)
Potassium: 3.9 mEq/L (ref 3.5–5.3)
Sodium: 137 mEq/L (ref 135–145)
Total Protein: 6.7 g/dL (ref 6.0–8.3)

## 2011-01-25 LAB — LIPID PANEL
Cholesterol: 238 mg/dL — ABNORMAL HIGH (ref 0–200)
Total CHOL/HDL Ratio: 3.6 Ratio

## 2011-01-25 LAB — TSH: TSH: 1.883 u[IU]/mL (ref 0.350–4.500)

## 2011-01-25 MED ORDER — TRIAMTERENE-HCTZ 37.5-25 MG PO TABS: 1.0000 | ORAL_TABLET | Freq: Every day | ORAL | Status: DC

## 2011-01-25 MED ORDER — LOSARTAN POTASSIUM 50 MG PO TABS: 50.0000 mg | ORAL_TABLET | Freq: Every day | ORAL | Status: DC

## 2011-01-25 MED ORDER — PRAVASTATIN SODIUM 20 MG PO TABS: 20.0000 mg | ORAL_TABLET | Freq: Every day | ORAL | Status: DC

## 2011-02-27 ENCOUNTER — Encounter: Payer: Self-pay | Admitting: Internal Medicine

## 2011-03-28 ENCOUNTER — Ambulatory Visit (INDEPENDENT_AMBULATORY_CARE_PROVIDER_SITE_OTHER): Payer: Self-pay | Admitting: Internal Medicine

## 2011-03-28 ENCOUNTER — Encounter: Payer: Self-pay | Admitting: Internal Medicine

## 2011-03-28 VITALS — BP 121/88 | HR 91 | Temp 97.6°F | Ht 64.0 in | Wt 165.0 lb

## 2011-03-28 DIAGNOSIS — Z8679 Personal history of other diseases of the circulatory system: Secondary | ICD-10-CM

## 2011-03-28 DIAGNOSIS — F17200 Nicotine dependence, unspecified, uncomplicated: Secondary | ICD-10-CM

## 2011-03-28 DIAGNOSIS — I10 Essential (primary) hypertension: Secondary | ICD-10-CM

## 2011-03-28 DIAGNOSIS — E785 Hyperlipidemia, unspecified: Secondary | ICD-10-CM

## 2011-03-28 DIAGNOSIS — F341 Dysthymic disorder: Secondary | ICD-10-CM

## 2011-03-28 DIAGNOSIS — K219 Gastro-esophageal reflux disease without esophagitis: Secondary | ICD-10-CM

## 2011-03-28 MED ORDER — LORAZEPAM 0.5 MG PO TABS: 0.5000 mg | ORAL_TABLET | Freq: Two times a day (BID) | ORAL | Status: DC | PRN

## 2011-03-28 MED ORDER — PRAVASTATIN SODIUM 20 MG PO TABS: 20.0000 mg | ORAL_TABLET | Freq: Every day | ORAL | Status: DC

## 2011-03-28 MED ORDER — OMEPRAZOLE 20 MG PO CPDR
20.0000 mg | DELAYED_RELEASE_CAPSULE | Freq: Every day | ORAL | Status: DC
Start: 2011-03-28 — End: 2011-10-04

## 2011-03-28 MED ORDER — LOSARTAN POTASSIUM 50 MG PO TABS: 50.0000 mg | ORAL_TABLET | Freq: Every day | ORAL | Status: DC

## 2011-03-28 MED ORDER — PAROXETINE HCL 20 MG PO TABS: 20.0000 mg | ORAL_TABLET | ORAL | Status: DC

## 2011-03-28 MED ORDER — TRIAMTERENE-HCTZ 37.5-25 MG PO TABS: 1.0000 | ORAL_TABLET | Freq: Every day | ORAL | Status: DC

## 2011-03-28 NOTE — Patient Instructions (Signed)
You'll receive a call by Monday to inform you of your labs

## 2011-03-28 NOTE — Assessment & Plan Note (Signed)
Patient's most recent LDL was 140, Given patient's age and other comorbidities her LDL goal is likely to be less than 130, she was recently started on statins, will recheck liver function tests today along with a repeat lipid panel.

## 2011-03-28 NOTE — Assessment & Plan Note (Signed)
Refill provided for Paxil and Ativan

## 2011-03-28 NOTE — Assessment & Plan Note (Signed)
Patient was counseled on smoking cessation strategies including medications and behavior modification options. Patient said she was not ready to stop smoking at this time.    

## 2011-03-28 NOTE — Assessment & Plan Note (Signed)
Status post ablation x2 in year 2000. Now complains of intermittent palpitations, frequency of which are very S. sometimes can occur several times in one week and sometimes does not occur for several weeks. Given her history she will need to be referred to cardiology for ambulatory monitoring and possible further interventions. However patient does not have insurance right now, once she establishes insurance will make a referral to cardiology

## 2011-03-28 NOTE — Assessment & Plan Note (Signed)
Well controlled on current treatment, No new changes made today, Will continue to monitor. Refills on medications provided

## 2011-03-28 NOTE — Progress Notes (Signed)
Patient ID: Catherine Gilmore, female   DOB: 11/29/1955, 56 y.o.   MRN: [id number]  HPI:   Patient is a 56 year old female with a past medical history listed below, presents to the outpatient clinic for a two-month followup after she was started on statins for hyperlipidemia. Patient reports that she is tolerating this medication well, and has been compliant with it, she would also like medication refills. No other complaints today  Review of Systems: Negative except per history of present illness  Physical Exam:  Nursing notes and vitals reviewed General:  alert, well-developed, and cooperative to examination.   Lungs:  normal respiratory effort, no accessory muscle use, normal breath sounds, no crackles, and no wheezes. Heart:  normal rate, regular rhythm, no murmurs, no gallop, and no rub.   Abdomen:  soft, non-tender, normal bowel sounds, no distention, no guarding, no rebound tenderness, no hepatomegaly, and no splenomegaly.   Extremities:  No cyanosis, clubbing, edema Neurologic:  alert & oriented X3, nonfocal exam  Meds: No current outpatient prescriptions on file as of 03/28/2011.   No current facility-administered medications on file as of 03/28/2011.    Allergies: Lisinopril Past Medical History  Diagnosis Date  . Hyperlipidemia   . GERD (gastroesophageal reflux disease)   . Hypertension   . Anxiety   . Supraventricular tachycardia, paroxysmal 01/1998,02/1998    s/p ablation and cardioversion   . Mitral valve prolapse   . Back pain, chronic   . Polycythemia   . History of gestational diabetes   . Hypokalemia   . Flushing    Past Surgical History  Procedure Date  . Svt ablation     x2   Family History  Problem Relation Age of Onset  . Cancer Mother   . Cirrhosis Father    History   Social History  . Marital Status: Legally Separated    Spouse Name: N/A    Number of Children: N/A  . Years of Education: N/A   Occupational History  . Not on file.   Social  History Main Topics  . Smoking status: Current Everyday Smoker -- 1.5 packs/day  . Smokeless tobacco: Not on file  . Alcohol Use: Yes     socially  . Drug Use: No  . Sexually Active: Not on file   Other Topics Concern  . Not on file   Social History Narrative  . No narrative on file

## 2011-03-29 ENCOUNTER — Other Ambulatory Visit: Payer: Self-pay | Admitting: Internal Medicine

## 2011-03-29 DIAGNOSIS — E876 Hypokalemia: Secondary | ICD-10-CM

## 2011-03-29 LAB — COMPREHENSIVE METABOLIC PANEL
ALT: 25 U/L (ref 0–35)
AST: 35 U/L (ref 0–37)
Albumin: 4.7 g/dL (ref 3.5–5.2)
BUN: 12 mg/dL (ref 6–23)
CO2: 22 mEq/L (ref 19–32)
Calcium: 9.7 mg/dL (ref 8.4–10.5)
Chloride: 98 mEq/L (ref 96–112)
Potassium: 3.3 mEq/L — ABNORMAL LOW (ref 3.5–5.3)

## 2011-03-29 MED ORDER — POTASSIUM CHLORIDE CRYS ER 20 MEQ PO TBCR: 20.0000 meq | EXTENDED_RELEASE_TABLET | Freq: Every day | ORAL | Status: DC

## 2011-03-29 NOTE — Assessment & Plan Note (Signed)
K 3.3 on 03/28/2011, KCL called in to pharmacy a script for KCL daily x3 days, recheck BMET at next followup.

## 2011-08-07 ENCOUNTER — Other Ambulatory Visit: Payer: Self-pay | Admitting: *Deleted

## 2011-08-07 DIAGNOSIS — I10 Essential (primary) hypertension: Secondary | ICD-10-CM

## 2011-08-07 MED ORDER — TRIAMTERENE-HCTZ 37.5-25 MG PO TABS: 1.0000 | ORAL_TABLET | Freq: Every day | ORAL | Status: DC

## 2011-10-04 ENCOUNTER — Other Ambulatory Visit: Payer: Self-pay | Admitting: *Deleted

## 2011-10-04 DIAGNOSIS — E785 Hyperlipidemia, unspecified: Secondary | ICD-10-CM

## 2011-10-04 DIAGNOSIS — I10 Essential (primary) hypertension: Secondary | ICD-10-CM

## 2011-10-04 DIAGNOSIS — F341 Dysthymic disorder: Secondary | ICD-10-CM

## 2011-10-04 DIAGNOSIS — K219 Gastro-esophageal reflux disease without esophagitis: Secondary | ICD-10-CM

## 2011-10-08 ENCOUNTER — Telehealth: Payer: Self-pay | Admitting: *Deleted

## 2011-10-08 MED ORDER — PRAVASTATIN SODIUM 20 MG PO TABS: 20.0000 mg | ORAL_TABLET | Freq: Every day | ORAL | Status: DC

## 2011-10-08 MED ORDER — LORAZEPAM 0.5 MG PO TABS: 0.5000 mg | ORAL_TABLET | Freq: Two times a day (BID) | ORAL | Status: DC | PRN

## 2011-10-08 MED ORDER — OMEPRAZOLE 20 MG PO CPDR: 20.0000 mg | DELAYED_RELEASE_CAPSULE | Freq: Every day | ORAL | Status: DC

## 2011-10-08 MED ORDER — LOSARTAN POTASSIUM 50 MG PO TABS: 50.0000 mg | ORAL_TABLET | Freq: Every day | ORAL | Status: DC

## 2011-10-08 MED ORDER — PAROXETINE HCL 20 MG PO TABS: 20.0000 mg | ORAL_TABLET | ORAL | Status: DC

## 2011-10-08 NOTE — Telephone Encounter (Signed)
Called ativan to pharm, #30 no refills, pt notified

## 2011-10-08 NOTE — Telephone Encounter (Signed)
Pt has appt coming up, ativan is changed to #30 w/ 0 refills

## 2011-10-17 ENCOUNTER — Encounter: Payer: Self-pay | Admitting: Internal Medicine

## 2011-10-17 ENCOUNTER — Ambulatory Visit (INDEPENDENT_AMBULATORY_CARE_PROVIDER_SITE_OTHER): Payer: Self-pay | Admitting: Internal Medicine

## 2011-10-17 VITALS — BP 115/67 | HR 68 | Temp 97.0°F | Ht 64.0 in | Wt 155.6 lb

## 2011-10-17 DIAGNOSIS — F17200 Nicotine dependence, unspecified, uncomplicated: Secondary | ICD-10-CM

## 2011-10-17 DIAGNOSIS — F341 Dysthymic disorder: Secondary | ICD-10-CM

## 2011-10-17 DIAGNOSIS — M719 Bursopathy, unspecified: Secondary | ICD-10-CM

## 2011-10-17 DIAGNOSIS — Z0000 Encounter for general adult medical examination without abnormal findings: Secondary | ICD-10-CM

## 2011-10-17 DIAGNOSIS — M7540 Impingement syndrome of unspecified shoulder: Secondary | ICD-10-CM | POA: Insufficient documentation

## 2011-10-17 DIAGNOSIS — L821 Other seborrheic keratosis: Secondary | ICD-10-CM

## 2011-10-17 DIAGNOSIS — Z1239 Encounter for other screening for malignant neoplasm of breast: Secondary | ICD-10-CM

## 2011-10-17 DIAGNOSIS — Z23 Encounter for immunization: Secondary | ICD-10-CM

## 2011-10-17 DIAGNOSIS — L989 Disorder of the skin and subcutaneous tissue, unspecified: Secondary | ICD-10-CM

## 2011-10-17 DIAGNOSIS — I10 Essential (primary) hypertension: Secondary | ICD-10-CM

## 2011-10-17 MED ORDER — MELOXICAM 7.5 MG PO TABS: 7.5000 mg | ORAL_TABLET | Freq: Every day | ORAL | Status: AC

## 2011-10-17 NOTE — Assessment & Plan Note (Signed)
BP well-controlled today, continue current medication regimen

## 2011-10-17 NOTE — Progress Notes (Signed)
HPI The patient is a 56 y.o. yo female with a history of HTN, HL, presenting for a routine follow-up appointment.  The patient notes right shoulder pain x6-7 weeks, associated with a new job involving a lot of heavy lifting.  She notes that the pain feels like a "burning" or "sharp" pain sensation on her right posterior shoulder, present all the time, but worse with activity.  The patient used a heating pad to the area, as well as aspirin, but still notes pain.  She also notes a discrete area of itching around the site of pain.  The patient also notes a "mole" which has turned darker in color, and is itching.  The mole has been present since birth, but started changing in the last 1 month.  She notes no pain.  The patient notes that she occasionally uses Ativan, but notes that she has only taken about 1 in the last 8 months.  She notes that "just having it in my bag makes me feel better".  She recently asked for a refill because her old bottle had expired.  But hasn't had to use this.  The patient notes significant stress after her husband died in a car accident several years ago.  The patient notes that her sister and maternal grandmother have had breast cancer.  She currently notes no breast lumps.  The patient has cut back in her smoking from 1.5 to 1 ppd.  She states that she has quit in the past.  The patient is not yet ready.  ROS: General: no fevers, chills, changes in weight, changes in appetite Skin: no rash HEENT: no blurry vision, hearing changes, sore throat Pulm: no dyspnea, coughing, wheezing CV: no chest pain, palpitations, shortness of breath Abd: no abdominal pain, nausea/vomiting, diarrhea/constipation GU: no dysuria, hematuria, polyuria Ext: see HPI Neuro: no weakness, numbness, or tingling  Filed Vitals:   10/17/11 1326  BP: 115/67  Pulse: 68  Temp: 97 F (36.1 C)    PEX General: alert, cooperative, and in no apparent distress HEENT: pupils equal round and  reactive to light, vision grossly intact, oropharynx clear and non-erythematous  Neck: supple, no lymphadenopathy Lungs: clear to ascultation bilaterally, normal work of respiration, no wheezes, rales, ronchi Heart: regular rate and rhythm, no murmurs, gallops, or rubs Abdomen: soft, non-tender, non-distended, normal bowel sounds Back: approx 6 x 3 mm "stuck-on" papule, light Arleene Settle in appearance with dark Tabita Corbo/black "flecks" scattered homogenously throughout Extremities: no cyanosis, clubbing, or edema.  Right shoulder with full passive and active ROM and no boney deformity Neurologic: Right-sided Neer, Hawkins, supraspinatus, infraspinatus, and subscapularis tests were all positive.  Drop arm test negative.  alert & oriented X3, cranial nerves II-XII intact, strength grossly intact, sensation intact to light touch  Current Outpatient Prescriptions on File Prior to Visit  Medication Sig Dispense Refill  . LORazepam (ATIVAN) 0.5 MG tablet Take 1 tablet (0.5 mg total) by mouth every 12 (twelve) hours as needed.  30 tablet  3  . losartan (COZAAR) 50 MG tablet Take 1 tablet (50 mg total) by mouth daily.  30 tablet  11  . omeprazole (PRILOSEC) 20 MG capsule Take 1 capsule (20 mg total) by mouth daily.  30 capsule  11  . PARoxetine (PAXIL) 20 MG tablet Take 1 tablet (20 mg total) by mouth every morning.  30 tablet  11  . potassium chloride SA (K-DUR,KLOR-CON) 20 MEQ tablet Take 1 tablet (20 mEq total) by mouth daily.  3 tablet  0  .  pravastatin (PRAVACHOL) 20 MG tablet Take 1 tablet (20 mg total) by mouth daily.  30 tablet  11  . triamterene-hydrochlorothiazide (MAXZIDE-25) 37.5-25 MG per tablet Take 1 each (1 tablet total) by mouth daily.  30 tablet  11    Assessment/Plan

## 2011-10-17 NOTE — Assessment & Plan Note (Signed)
Patient has a history of anxiety, on Paxil and Ativan.  Patient states that she only uses Ativan less than once/month for symptoms consistent with panic attacks (reportedly last used 8 months ago).  However, she states she finds relief in knowing that she has a bottle in her purse and could use it if she had a panic attack.  Narcotics database review from the last 18 months shows only 1 prescription for Ativan filled.  She shows me a prescription bottle from 2012 which still has at least 20 out of 30 pills remaining. -continue Paxil -Patient can continue Ativan.  Did not sign a pain contract at this time since usage is so infrequent.  No red flag behaviors, OK to continue prescribing this medication.

## 2011-10-17 NOTE — Assessment & Plan Note (Signed)
The patient's symptoms and physical exam are consistent with impingement syndrome -meloxicam for pain/inflammation -stretching exercises given to patient -if pain persists, can consider PT referral vs stronger pain medication

## 2011-10-17 NOTE — Assessment & Plan Note (Signed)
The patient's skin lesion on her right back is consistent in appearance with a seborrheic keratosis.  However, since it has recently grown and become pruritic, I would like to err on the side of caution and refer to dermatology to rule-out atypical nevus.  Patient initially agreed, but as she was walking out the door, changed her mind and decided she does not want dermatology referral at this time, and would prefer to observe for now.  I informed her I would advise her to see a dermatologist, but still she declined. -cancel referral to dermatology -evaluate at next visit, reconsider derm referral at that time based on change in appearance

## 2011-10-17 NOTE — Patient Instructions (Addendum)
Your shoulder pain comes from a rotator cuff injury. -Performing the following exercises below will help strengthen and stretch these muscles, and help you heal more quickly -For pain, take the anti-inflammatory medication Meloxicam, 1 tab daily.  If you continue to have pain, call our clinic for further instructions.  The area on your back is most likely harmless.  However, since it has changed in size, we would like to refer you to a dermatologist.  We will call you to set up this appointment.  We are referring you to [facility] for a Mammogram for Breast Cancer Screening.  Please return for a follow-up visit in about 6 months, or sooner if symptoms do not improve.   Impingement Syndrome, Rotator Cuff, Bursitis with Rehab Impingement syndrome is a condition that involves inflammation of the tendons of the rotator cuff and the subacromial bursa, that causes pain in the shoulder. The rotator cuff consists of four tendons and muscles that control much of the shoulder and upper arm function. The subacromial bursa is a fluid filled sac that helps reduce friction between the rotator cuff and one of the bones of the shoulder (acromion). Impingement syndrome is usually an overuse injury that causes swelling of the bursa (bursitis), swelling of the tendon (tendonitis), and/or a tear of the tendon (strain). Strains are classified into three categories. Grade 1 strains cause pain, but the tendon is not lengthened. Grade 2 strains include a lengthened ligament, due to the ligament being stretched or partially ruptured. With grade 2 strains there is still function, although the function may be decreased. Grade 3 strains include a complete tear of the tendon or muscle, and function is usually impaired. SYMPTOMS   Pain around the shoulder, often at the outer portion of the upper arm.  Pain that gets worse with shoulder function, especially when reaching overhead or lifting.  Sometimes, aching when not  using the arm.  Pain that wakes you up at night.  Sometimes, tenderness, swelling, warmth, or redness over the affected area.  Loss of strength.  Limited motion of the shoulder, especially reaching behind the back (to the back pocket or to unhook bra) or across your body.  Crackling sound (crepitation) when moving the arm.  Biceps tendon pain and inflammation (in the front of the shoulder). Worse when bending the elbow or lifting. CAUSES  Impingement syndrome is often an overuse injury, in which chronic (repetitive) motions cause the tendons or bursa to become inflamed. A strain occurs when a force is paced on the tendon or muscle that is greater than it can withstand. Common mechanisms of injury include: Stress from sudden increase in duration, frequency, or intensity of training.  Direct hit (trauma) to the shoulder.  Aging, erosion of the tendon with normal use.  Bony bump on shoulder (acromial spur). RISK INCREASES WITH:  Contact sports (football, wrestling, boxing).  Throwing sports (baseball, tennis, volleyball).  Weightlifting and bodybuilding.  Heavy labor.  Previous injury to the rotator cuff, including impingement.  Poor shoulder strength and flexibility.  Failure to warm up properly before activity.  Inadequate protective equipment.  Old age.  Bony bump on shoulder (acromial spur). PREVENTION   Warm up and stretch properly before activity.  Allow for adequate recovery between workouts.  Maintain physical fitness:  Strength, flexibility, and endurance.  Cardiovascular fitness.  Learn and use proper exercise technique. PROGNOSIS  If treated properly, impingement syndrome usually goes away within 6 weeks. Sometimes surgery is required.  RELATED COMPLICATIONS   Longer healing  time if not properly treated, or if not given enough time to heal.  Recurring symptoms, that result in a chronic condition.  Shoulder stiffness, frozen shoulder, or loss of  motion.  Rotator cuff tendon tear.  Recurring symptoms, especially if activity is resumed too soon, with overuse, with a direct blow, or when using poor technique. TREATMENT  Treatment first involves the use of ice and medicine, to reduce pain and inflammation. The use of strengthening and stretching exercises may help reduce pain with activity. These exercises may be performed at home or with a therapist. If non-surgical treatment is unsuccessful after more than 6 months, surgery may be advised. After surgery and rehabilitation, activity is usually possible in 3 months.  MEDICATION  If pain medicine is needed, nonsteroidal anti-inflammatory medicines (aspirin and ibuprofen), or other minor pain relievers (acetaminophen), are often advised.  Do not take pain medicine for 7 days before surgery.  Prescription pain relievers may be given, if your caregiver thinks they are needed. Use only as directed and only as much as you need.  Corticosteroid injections may be given by your caregiver. These injections should be reserved for the most serious cases, because they may only be given a certain number of times. HEAT AND COLD  Cold treatment (icing) should be applied for 10 to 15 minutes every 2 to 3 hours for inflammation and pain, and immediately after activity that aggravates your symptoms. Use ice packs or an ice massage.  Heat treatment may be used before performing stretching and strengthening activities prescribed by your caregiver, physical therapist, or athletic trainer. Use a heat pack or a warm water soak. SEEK MEDICAL CARE IF:   Symptoms get worse or do not improve in 4 to 6 weeks, despite treatment.  New, unexplained symptoms develop. (Drugs used in treatment may produce side effects.) EXERCISES  RANGE OF MOTION (ROM) AND STRETCHING EXERCISES - Impingement Syndrome (Rotator Cuff  Tendinitis, Bursitis) These exercises may help you when beginning to rehabilitate your injury. Your  symptoms may go away with or without further involvement from your physician, physical therapist or athletic trainer. While completing these exercises, remember:   Restoring tissue flexibility helps normal motion to return to the joints. This allows healthier, less painful movement and activity.  An effective stretch should be held for at least 30 seconds.  A stretch should never be painful. You should only feel a gentle lengthening or release in the stretched tissue. STRETCH  Flexion, Standing  Stand with good posture. With an underhand grip on your right / left hand, and an overhand grip on the opposite hand, grasp a broomstick or cane so that your hands are a little more than shoulder width apart.  Keeping your right / left elbow straight and shoulder muscles relaxed, push the stick with your opposite hand, to raise your right / left arm in front of your body and then overhead. Raise your arm until you feel a stretch in your right / left shoulder, but before you have increased shoulder pain.  Try to avoid shrugging your right / left shoulder as your arm rises, by keeping your shoulder blade tucked down and toward your mid-back spine. Hold for __________ seconds.  Slowly return to the starting position. Repeat __________ times. Complete this exercise __________ times per day. STRETCH  Abduction, Supine  Lie on your back. With an underhand grip on your right / left hand and an overhand grip on the opposite hand, grasp a broomstick or cane so that your  hands are a little more than shoulder width apart.  Keeping your right / left elbow straight and your shoulder muscles relaxed, push the stick with your opposite hand, to raise your right / left arm out to the side of your body and then overhead. Raise your arm until you feel a stretch in your right / left shoulder, but before you have increased shoulder pain.  Try to avoid shrugging your right / left shoulder as your arm rises, by keeping your  shoulder blade tucked down and toward your mid-back spine. Hold for __________ seconds.  Slowly return to the starting position. Repeat __________ times. Complete this exercise __________ times per day. ROM  Flexion, Active-Assisted  Lie on your back. You may bend your knees for comfort.  Grasp a broomstick or cane so your hands are about shoulder width apart. Your right / left hand should grip the end of the stick, so that your hand is positioned "thumbs-up," as if you were about to shake hands.  Using your healthy arm to lead, raise your right / left arm overhead, until you feel a gentle stretch in your shoulder. Hold for __________ seconds.  Use the stick to assist in returning your right / left arm to its starting position. Repeat __________ times. Complete this exercise __________ times per day.  ROM - Internal Rotation, Supine   Lie on your back on a firm surface. Place your right / left elbow about 60 degrees away from your side. Elevate your elbow with a folded towel, so that the elbow and shoulder are the same height.  Using a broomstick or cane and your strong arm, pull your right / left hand toward your body until you feel a gentle stretch, but no increase in your shoulder pain. Keep your shoulder and elbow in place throughout the exercise.  Hold for __________ seconds. Slowly return to the starting position. Repeat __________ times. Complete this exercise __________ times per day. STRETCH - Internal Rotation  Place your right / left hand behind your back, palm up.  Throw a towel or belt over your opposite shoulder. Grasp the towel with your right / left hand.  While keeping an upright posture, gently pull up on the towel, until you feel a stretch in the front of your right / left shoulder.  Avoid shrugging your right / left shoulder as your arm rises, by keeping your shoulder blade tucked down and toward your mid-back spine.  Hold for __________ seconds. Release the  stretch, by lowering your healthy hand. Repeat __________ times. Complete this exercise __________ times per day. ROM - Internal Rotation   Using an underhand grip, grasp a stick behind your back with both hands.  While standing upright with good posture, slide the stick up your back until you feel a mild stretch in the front of your shoulder.  Hold for __________ seconds. Slowly return to your starting position. Repeat __________ times. Complete this exercise __________ times per day.  STRETCH  Posterior Shoulder Capsule   Stand or sit with good posture. Grasp your right / left elbow and draw it across your chest, keeping it at the same height as your shoulder.  Pull your elbow, so your upper arm comes in closer to your chest. Pull until you feel a gentle stretch in the back of your shoulder.  Hold for __________ seconds. Repeat __________ times. Complete this exercise __________ times per day. STRENGTHENING EXERCISES - Impingement Syndrome (Rotator Cuff Tendinitis, Bursitis) These exercises may help you  when beginning to rehabilitate your injury. They may resolve your symptoms with or without further involvement from your physician, physical therapist or athletic trainer. While completing these exercises, remember:  Muscles can gain both the endurance and the strength needed for everyday activities through controlled exercises.  Complete these exercises as instructed by your physician, physical therapist or athletic trainer. Increase the resistance and repetitions only as guided.  You may experience muscle soreness or fatigue, but the pain or discomfort you are trying to eliminate should never worsen during these exercises. If this pain does get worse, stop and make sure you are following the directions exactly. If the pain is still present after adjustments, discontinue the exercise until you can discuss the trouble with your clinician.  During your recovery, avoid activity or exercises  which involve actions that place your injured hand or elbow above your head or behind your back or head. These positions stress the tissues which you are trying to heal. STRENGTH - Scapular Depression and Adduction   With good posture, sit on a firm chair. Support your arms in front of you, with pillows, arm rests, or on a table top. Have your elbows in line with the sides of your body.  Gently draw your shoulder blades down and toward your mid-back spine. Gradually increase the tension, without tensing the muscles along the top of your shoulders and the back of your neck.  Hold for __________ seconds. Slowly release the tension and relax your muscles completely before starting the next repetition.  After you have practiced this exercise, remove the arm support and complete the exercise in standing as well as sitting position. Repeat __________ times. Complete this exercise __________ times per day.  STRENGTH - Shoulder Abductors, Isometric  With good posture, stand or sit about 4-6 inches from a wall, with your right / left side facing the wall.  Bend your right / left elbow. Gently press your right / left elbow into the wall. Increase the pressure gradually, until you are pressing as hard as you can, without shrugging your shoulder or increasing any shoulder discomfort.  Hold for __________ seconds.  Release the tension slowly. Relax your shoulder muscles completely before you begin the next repetition. Repeat __________ times. Complete this exercise __________ times per day.  STRENGTH - External Rotators, Isometric  Keep your right / left elbow at your side and bend it 90 degrees.  Step into a door frame so that the outside of your right / left wrist can press against the door frame without your upper arm leaving your side.  Gently press your right / left wrist into the door frame, as if you were trying to swing the back of your hand away from your stomach. Gradually increase the  tension, until you are pressing as hard as you can, without shrugging your shoulder or increasing any shoulder discomfort.  Hold for __________ seconds.  Release the tension slowly. Relax your shoulder muscles completely before you begin the next repetition. Repeat __________ times. Complete this exercise __________ times per day.  STRENGTH - Supraspinatus   Stand or sit with good posture. Grasp a __________ weight, or an exercise band or tubing, so that your hand is "thumbs-up," like you are shaking hands.  Slowly lift your right / left arm in a "V" away from your thigh, diagonally into the space between your side and straight ahead. Lift your hand to shoulder height or as far as you can, without increasing any shoulder pain. At first,  many people do not lift their hands above shoulder height.  Avoid shrugging your right / left shoulder as your arm rises, by keeping your shoulder blade tucked down and toward your mid-back spine.  Hold for __________ seconds. Control the descent of your hand, as you slowly return to your starting position. Repeat __________ times. Complete this exercise __________ times per day.  STRENGTH - External Rotators  Secure a rubber exercise band or tubing to a fixed object (table, pole) so that it is at the same height as your right / left elbow when you are standing or sitting on a firm surface.  Stand or sit so that the secured exercise band is at your uninjured side.  Bend your right / left elbow 90 degrees. Place a folded towel or small pillow under your right / left arm, so that your elbow is a few inches away from your side.  Keeping the tension on the exercise band, pull it away from your body, as if pivoting on your elbow. Be sure to keep your body steady, so that the movement is coming only from your rotating shoulder.  Hold for __________ seconds. Release the tension in a controlled manner, as you return to the starting position. Repeat __________  times. Complete this exercise __________ times per day.  STRENGTH - Internal Rotators   Secure a rubber exercise band or tubing to a fixed object (table, pole) so that it is at the same height as your right / left elbow when you are standing or sitting on a firm surface.  Stand or sit so that the secured exercise band is at your right / left side.  Bend your elbow 90 degrees. Place a folded towel or small pillow under your right / left arm so that your elbow is a few inches away from your side.  Keeping the tension on the exercise band, pull it across your body, toward your stomach. Be sure to keep your body steady, so that the movement is coming only from your rotating shoulder.  Hold for __________ seconds. Release the tension in a controlled manner, as you return to the starting position. Repeat __________ times. Complete this exercise __________ times per day.  STRENGTH - Scapular Protractors, Standing   Stand arms length away from a wall. Place your hands on the wall, keeping your elbows straight.  Begin by dropping your shoulder blades down and toward your mid-back spine.  To strengthen your protractors, keep your shoulder blades down, but slide them forward on your rib cage. It will feel as if you are lifting the back of your rib cage away from the wall. This is a subtle motion and can be challenging to complete. Ask your caregiver for further instruction, if you are not sure you are doing the exercise correctly.  Hold for __________ seconds. Slowly return to the starting position, resting the muscles completely before starting the next repetition. Repeat __________ times. Complete this exercise __________ times per day. STRENGTH - Scapular Protractors, Supine  Lie on your back on a firm surface. Extend your right / left arm straight into the air while holding a __________ weight in your hand.  Keeping your head and back in place, lift your shoulder off the floor.  Hold for  __________ seconds. Slowly return to the starting position, and allow your muscles to relax completely before starting the next repetition. Repeat __________ times. Complete this exercise __________ times per day. STRENGTH - Scapular Protractors, Quadruped  Get onto your hands  and knees, with your shoulders directly over your hands (or as close as you can be, comfortably).  Keeping your elbows locked, lift the back of your rib cage up into your shoulder blades, so your mid-back rounds out. Keep your neck muscles relaxed.  Hold this position for __________ seconds. Slowly return to the starting position and allow your muscles to relax completely before starting the next repetition. Repeat __________ times. Complete this exercise __________ times per day.  STRENGTH - Scapular Retractors  Secure a rubber exercise band or tubing to a fixed object (table, pole), so that it is at the height of your shoulders when you are either standing, or sitting on a firm armless chair.  With a palm down grip, grasp an end of the band in each hand. Straighten your elbows and lift your hands straight in front of you, at shoulder height. Step back, away from the secured end of the band, until it becomes tense.  Squeezing your shoulder blades together, draw your elbows back toward your sides, as you bend them. Keep your upper arms lifted away from your body throughout the exercise.  Hold for __________ seconds. Slowly ease the tension on the band, as you reverse the directions and return to the starting position. Repeat __________ times. Complete this exercise __________ times per day. STRENGTH - Shoulder Extensors   Secure a rubber exercise band or tubing to a fixed object (table, pole) so that it is at the height of your shoulders when you are either standing, or sitting on a firm armless chair.  With a thumbs-up grip, grasp an end of the band in each hand. Straighten your elbows and lift your hands straight in  front of you, at shoulder height. Step back, away from the secured end of the band, until it becomes tense.  Squeezing your shoulder blades together, pull your hands down to the sides of your thighs. Do not allow your hands to go behind you.  Hold for __________ seconds. Slowly ease the tension on the band, as you reverse the directions and return to the starting position. Repeat __________ times. Complete this exercise __________ times per day.  STRENGTH - Scapular Retractors and External Rotators   Secure a rubber exercise band or tubing to a fixed object (table, pole) so that it is at the height as your shoulders, when you are either standing, or sitting on a firm armless chair.  With a palm down grip, grasp an end of the band in each hand. Bend your elbows 90 degrees and lift your elbows to shoulder height, at your sides. Step back, away from the secured end of the band, until it becomes tense.  Squeezing your shoulder blades together, rotate your shoulders so that your upper arms and elbows remain stationary, but your fists travel upward to head height.  Hold for __________ seconds. Slowly ease the tension on the band, as you reverse the directions and return to the starting position. Repeat __________ times. Complete this exercise __________ times per day.  STRENGTH - Scapular Retractors and External Rotators, Rowing   Secure a rubber exercise band or tubing to a fixed object (table, pole) so that it is at the height of your shoulders, when you are either standing, or sitting on a firm armless chair.  With a palm down grip, grasp an end of the band in each hand. Straighten your elbows and lift your hands straight in front of you, at shoulder height. Step back, away from the secured end  of the band, until it becomes tense.  Step 1: Squeeze your shoulder blades together. Bending your elbows, draw your hands to your chest, as if you are rowing a boat. At the end of this motion, your hands  and elbow should be at shoulder height and your elbows should be out to your sides.  Step 2: Rotate your shoulders, to raise your hands above your head. Your forearms should be vertical and your upper arms should be horizontal.  Hold for __________ seconds. Slowly ease the tension on the band, as you reverse the directions and return to the starting position. Repeat __________ times. Complete this exercise __________ times per day.  STRENGTH  Scapular Depressors  Find a sturdy chair without wheels, such as a dining room chair.  Keeping your feet on the floor, and your hands on the chair arms, lift your bottom up from the seat, and lock your elbows.  Keeping your elbows straight, allow gravity to pull your body weight down. Your shoulders will rise toward your ears.  Raise your body against gravity by drawing your shoulder blades down your back, shortening the distance between your shoulders and ears. Although your feet should always maintain contact with the floor, your feet should progressively support less body weight, as you get stronger.  Hold for __________ seconds. In a controlled and slow manner, lower your body weight to begin the next repetition. Repeat __________ times. Complete this exercise __________ times per day.  Document Released: 12/24/2004 Document Revised: 03/18/2011 Document Reviewed: 04/07/2008 [facility] Patient Information 2013 [place], [place].

## 2011-10-17 NOTE — Assessment & Plan Note (Signed)
Patient counseled on smoking cessation.  States she has quit in the past.  Not ready to quit yet.  Patient informed about resources available for smoking cessation.

## 2011-10-17 NOTE — Progress Notes (Signed)
Application for Mammogram Scholarship completed and faxed for an appt.

## 2011-10-17 NOTE — Assessment & Plan Note (Signed)
-  patient referred for yearly mammogram -patient given flu shot -patient declines colonoscopy or FOBT at this time, but agrees to reassess at our next visit

## 2011-11-11 NOTE — Addendum Note (Signed)
Addended by: Neomia Dear on: 11/11/2011 07:14 PM   Modules accepted: Orders

## 2011-11-15 ENCOUNTER — Telehealth: Payer: Self-pay | Admitting: *Deleted

## 2011-11-15 NOTE — Telephone Encounter (Signed)
Please see series of FYI's listed

## 2012-10-05 ENCOUNTER — Encounter: Payer: Self-pay | Admitting: Internal Medicine

## 2015-04-06 ENCOUNTER — Other Ambulatory Visit: Payer: Self-pay | Admitting: Internal Medicine

## 2015-04-06 DIAGNOSIS — Z1231 Encounter for screening mammogram for malignant neoplasm of breast: Secondary | ICD-10-CM

## 2015-04-24 ENCOUNTER — Ambulatory Visit: Payer: Self-pay

## 2015-06-15 ENCOUNTER — Other Ambulatory Visit: Payer: Self-pay | Admitting: Internal Medicine

## 2015-06-15 ENCOUNTER — Other Ambulatory Visit (HOSPITAL_COMMUNITY)
Admission: RE | Admit: 2015-06-15 | Discharge: 2015-06-15 | Disposition: A | Discharge status: Home / Self Care | Payer: Managed Care, Other (non HMO) | Source: Ambulatory Visit | Attending: Internal Medicine | Admitting: Internal Medicine

## 2015-06-15 DIAGNOSIS — Z01419 Encounter for gynecological examination (general) (routine) without abnormal findings: Secondary | ICD-10-CM | POA: Insufficient documentation

## 2015-06-15 DIAGNOSIS — Z1151 Encounter for screening for human papillomavirus (HPV): Secondary | ICD-10-CM | POA: Insufficient documentation

## 2015-06-15 DIAGNOSIS — Z1231 Encounter for screening mammogram for malignant neoplasm of breast: Secondary | ICD-10-CM

## 2015-06-15 DIAGNOSIS — E041 Nontoxic single thyroid nodule: Secondary | ICD-10-CM

## 2015-06-16 ENCOUNTER — Ambulatory Visit
Admission: RE | Admit: 2015-06-16 | Discharge: 2015-06-16 | Disposition: A | Discharge status: Home / Self Care | Payer: Managed Care, Other (non HMO) | Source: Ambulatory Visit | Attending: Internal Medicine | Admitting: Internal Medicine

## 2015-06-16 DIAGNOSIS — E041 Nontoxic single thyroid nodule: Secondary | ICD-10-CM

## 2015-06-19 LAB — CYTOLOGY - PAP

## 2015-07-04 ENCOUNTER — Ambulatory Visit: Payer: Self-pay

## 2015-10-11 ENCOUNTER — Other Ambulatory Visit: Payer: Self-pay | Admitting: Surgery

## 2015-10-11 ENCOUNTER — Other Ambulatory Visit: Payer: Self-pay | Admitting: Family Medicine

## 2015-10-11 DIAGNOSIS — E041 Nontoxic single thyroid nodule: Secondary | ICD-10-CM

## 2015-10-16 ENCOUNTER — Other Ambulatory Visit: Payer: Managed Care, Other (non HMO)

## 2016-08-14 ENCOUNTER — Encounter: Payer: Self-pay | Admitting: Family Medicine

## 2016-08-14 ENCOUNTER — Ambulatory Visit (INDEPENDENT_AMBULATORY_CARE_PROVIDER_SITE_OTHER): Payer: Self-pay | Admitting: Family Medicine

## 2016-08-14 VITALS — BP 127/72 | HR 74 | Temp 98.4°F | Resp 14 | Ht 64.0 in | Wt 156.0 lb

## 2016-08-14 DIAGNOSIS — F17200 Nicotine dependence, unspecified, uncomplicated: Secondary | ICD-10-CM

## 2016-08-14 DIAGNOSIS — E7849 Other hyperlipidemia: Secondary | ICD-10-CM

## 2016-08-14 DIAGNOSIS — I10 Essential (primary) hypertension: Secondary | ICD-10-CM

## 2016-08-14 DIAGNOSIS — E784 Other hyperlipidemia: Secondary | ICD-10-CM

## 2016-08-14 DIAGNOSIS — Z23 Encounter for immunization: Secondary | ICD-10-CM

## 2016-08-14 DIAGNOSIS — F341 Dysthymic disorder: Secondary | ICD-10-CM

## 2016-08-14 LAB — CBC WITH DIFFERENTIAL/PLATELET
BASOS ABS: 0 {cells}/uL (ref 0–200)
Basophils Relative: 0 %
EOS PCT: 3 %
Eosinophils Absolute: 234 cells/uL (ref 15–500)
HCT: 50.2 % — ABNORMAL HIGH (ref 35.0–45.0)
Hemoglobin: 17.1 g/dL — ABNORMAL HIGH (ref 11.7–15.5)
LYMPHS PCT: 24 %
Lymphs Abs: 1872 cells/uL (ref 850–3900)
MCH: 31.6 pg (ref 27.0–33.0)
MCHC: 34.1 g/dL (ref 32.0–36.0)
MCV: 92.8 fL (ref 80.0–100.0)
MPV: 8.5 fL (ref 7.5–12.5)
Monocytes Absolute: 468 cells/uL (ref 200–950)
Monocytes Relative: 6 %
NEUTROS PCT: 67 %
Neutro Abs: 5226 cells/uL (ref 1500–7800)
PLATELETS: 299 10*3/uL (ref 140–400)
RBC: 5.41 MIL/uL — ABNORMAL HIGH (ref 3.80–5.10)
RDW: 13.4 % (ref 11.0–15.0)
WBC: 7.8 10*3/uL (ref 3.8–10.8)

## 2016-08-14 LAB — POCT URINALYSIS DIP (DEVICE)
Bilirubin Urine: NEGATIVE
Glucose, UA: NEGATIVE mg/dL
Ketones, ur: NEGATIVE mg/dL
LEUKOCYTES UA: NEGATIVE
NITRITE: NEGATIVE
Protein, ur: NEGATIVE mg/dL
Specific Gravity, Urine: 1.015 (ref 1.005–1.030)
UROBILINOGEN UA: 0.2 mg/dL (ref 0.0–1.0)
pH: 6 (ref 5.0–8.0)

## 2016-08-14 MED ORDER — LOSARTAN POTASSIUM 50 MG PO TABS: 50.0000 mg | ORAL_TABLET | Freq: Every day | ORAL | 11 refills | Status: DC

## 2016-08-14 MED ORDER — TRIAMTERENE-HCTZ 37.5-25 MG PO TABS: 1.0000 | ORAL_TABLET | Freq: Every day | ORAL | 11 refills | Status: DC

## 2016-08-14 MED ORDER — ASPIRIN EC 81 MG PO TBEC: 81.0000 mg | DELAYED_RELEASE_TABLET | Freq: Every day | ORAL | 11 refills | Status: DC

## 2016-08-14 MED ORDER — HYDROXYZINE HCL 10 MG PO TABS: 10.0000 mg | ORAL_TABLET | Freq: Three times a day (TID) | ORAL | 1 refills | Status: DC | PRN

## 2016-08-14 MED ORDER — PAROXETINE HCL 20 MG PO TABS: 20.0000 mg | ORAL_TABLET | ORAL | 11 refills | Status: DC

## 2016-08-14 MED FILL — ?TRIAMTERENE/HCTZ 37.5/25TB: 37.5-25 | 30 days supply | Qty: 30 | Fill #0

## 2016-08-14 MED FILL — hydrOXYzine HCL 10 MG TABS: 10 | 20 days supply | Qty: 60 | Fill #0

## 2016-08-14 MED FILL — LOSARTAN POTASSIUM 50 MG TA: 50 | 30 days supply | Qty: 30 | Fill #0

## 2016-08-14 MED FILL — PARoxetine HCL 20 MG TABS: 20 | 30 days supply | Qty: 30 | Fill #0

## 2016-08-14 NOTE — Patient Instructions (Addendum)
[place] [phone] to schedule mammogram Continue anti-hypertensive medications, blood pressure is at goal on current medication regimen.  Recommend daily aspirin 81 mg for stroke preventative Also, recommend OTC omega 3 fatty acid.   Will start Vistaril 10 mg three times per day as needed for anxiety. Refrain from drinking alcohol, driving, or operating machinery while taking this medication.   Will continue Paxil 20 mg daily.    Will follow up in 3 months for chronic conditions DASH Eating Plan DASH stands for "Dietary Approaches to Stop Hypertension." The DASH eating plan is a healthy eating plan that has been shown to reduce high blood pressure (hypertension). It may also reduce your risk for type 2 diabetes, heart disease, and stroke. The DASH eating plan may also help with weight loss. What are tips for following this plan? General guidelines  Avoid eating more than 2,300 mg (milligrams) of salt (sodium) a day. If you have hypertension, you may need to reduce your sodium intake to 1,500 mg a day.  Limit alcohol intake to no more than 1 drink a day for nonpregnant women and 2 drinks a day for men. One drink equals 12 oz of beer, 5 oz of wine, or 1 oz of hard liquor.  Work with your health care provider to maintain a healthy body weight or to lose weight. Ask what an ideal weight is for you.  Get at least 30 minutes of exercise that causes your heart to beat faster (aerobic exercise) most days of the week. Activities may include walking, swimming, or biking.  Work with your health care provider or diet and nutrition specialist (dietitian) to adjust your eating plan to your individual calorie needs. Reading food labels  Check food labels for the amount of sodium per serving. Choose foods with less than 5 percent of the Daily Value of sodium. Generally, foods with less than 300 mg of sodium per serving fit into this eating plan.  To find whole grains, look for the  word "whole" as the first word in the ingredient list. Shopping  Buy products labeled as "low-sodium" or "no salt added."  Buy fresh foods. Avoid canned foods and premade or frozen meals. Cooking  Avoid adding salt when cooking. Use salt-free seasonings or herbs instead of table salt or sea salt. Check with your health care provider or pharmacist before using salt substitutes.  Do not fry foods. Cook foods using healthy methods such as baking, boiling, grilling, and broiling instead.  Cook with heart-healthy oils, such as olive, canola, soybean, or sunflower oil. Meal planning   Eat a balanced diet that includes: ? 5 or more servings of fruits and vegetables each day. At each meal, try to fill half of your plate with fruits and vegetables. ? Up to 6-8 servings of whole grains each day. ? Less than 6 oz of lean meat, poultry, or fish each day. A 3-oz serving of meat is about the same size as a deck of cards. One egg equals 1 oz. ? 2 servings of low-fat dairy each day. ? A serving of nuts, seeds, or beans 5 times each week. ? Heart-healthy fats. Healthy fats called Omega-3 fatty acids are found in foods such as flaxseeds and coldwater fish, like sardines, salmon, and mackerel.  Limit how much you eat of the following: ? Canned or prepackaged foods. ? Food that is high in trans fat, such as fried foods. ? Food that is high in saturated fat, such as fatty meat. ? Sweets,  desserts, sugary drinks, and other foods with added sugar. ? Full-fat dairy products.  Do not salt foods before eating.  Try to eat at least 2 vegetarian meals each week.  Eat more home-cooked food and less restaurant, buffet, and fast food.  When eating at a restaurant, ask that your food be prepared with less salt or no salt, if possible. What foods are recommended? The items listed may not be a complete list. Talk with your dietitian about what dietary choices are best for you. Grains Whole-grain or  whole-wheat bread. Whole-grain or whole-wheat pasta. Brown rice. [place]. Bulgur. Whole-grain and low-sodium cereals. Pita bread. Low-fat, low-sodium crackers. Whole-wheat flour tortillas. Vegetables Fresh or frozen vegetables (raw, steamed, roasted, or grilled). Low-sodium or reduced-sodium tomato and vegetable juice. Low-sodium or reduced-sodium tomato sauce and tomato paste. Low-sodium or reduced-sodium canned vegetables. Fruits All fresh, dried, or frozen fruit. Canned fruit in natural juice (without added sugar). Meat and other protein foods Skinless chicken or [place]. Ground chicken or [place]. Pork with fat trimmed off. Fish and seafood. Egg whites. Dried beans, peas, or lentils. Unsalted nuts, nut butters, and seeds. Unsalted canned beans. Lean cuts of beef with fat trimmed off. Low-sodium, lean deli meat. Dairy Low-fat (1%) or fat-free (skim) milk. Fat-free, low-fat, or reduced-fat cheeses. Nonfat, low-sodium ricotta or cottage cheese. Low-fat or nonfat yogurt. Low-fat, low-sodium cheese. Fats and oils Soft margarine without trans fats. Vegetable oil. Low-fat, reduced-fat, or light mayonnaise and salad dressings (reduced-sodium). Canola, safflower, olive, soybean, and sunflower oils. Avocado. Seasoning and other foods Herbs. Spices. Seasoning mixes without salt. Unsalted popcorn and pretzels. Fat-free sweets. What foods are not recommended? The items listed may not be a complete list. Talk with your dietitian about what dietary choices are best for you. Grains Baked goods made with fat, such as croissants, muffins, or some breads. Dry pasta or rice meal packs. Vegetables Creamed or fried vegetables. Vegetables in a cheese sauce. Regular canned vegetables (not low-sodium or reduced-sodium). Regular canned tomato sauce and paste (not low-sodium or reduced-sodium). Regular tomato and vegetable juice (not low-sodium or reduced-sodium). Angie Fava. Olives. Fruits Canned fruit in a light  or heavy syrup. Fried fruit. Fruit in cream or butter sauce. Meat and other protein foods Fatty cuts of meat. Ribs. Fried meat. Berniece Salines. Sausage. Bologna and other processed lunch meats. Salami. Fatback. Hotdogs. Bratwurst. Salted nuts and seeds. Canned beans with added salt. Canned or smoked fish. Whole eggs or egg yolks. Chicken or [place] with skin. Dairy Whole or 2% milk, cream, and half-and-half. Whole or full-fat cream cheese. Whole-fat or sweetened yogurt. Full-fat cheese. Nondairy creamers. Whipped toppings. Processed cheese and cheese spreads. Fats and oils Butter. Stick margarine. Lard. Shortening. Ghee. Bacon fat. Tropical oils, such as coconut, palm kernel, or palm oil. Seasoning and other foods Salted popcorn and pretzels. Onion salt, garlic salt, seasoned salt, table salt, and sea salt. Worcestershire sauce. Tartar sauce. Barbecue sauce. Teriyaki sauce. Soy sauce, including reduced-sodium. Steak sauce. Canned and packaged gravies. Fish sauce. Oyster sauce. Cocktail sauce. Horseradish that you find on the shelf. Ketchup. Mustard. Meat flavorings and tenderizers. Bouillon cubes. Hot sauce and Tabasco sauce. Premade or packaged marinades. Premade or packaged taco seasonings. Relishes. Regular salad dressings. Where to find more information:  National Heart, Lung, and [place]: https://wilson-eaton.com/  American Heart Association: www.heart.org Summary  The DASH eating plan is a healthy eating plan that has been shown to reduce high blood pressure (hypertension). It may also reduce your risk for type 2 diabetes, heart  disease, and stroke.  With the DASH eating plan, you should limit salt (sodium) intake to 2,300 mg a day. If you have hypertension, you may need to reduce your sodium intake to 1,500 mg a day.  When on the DASH eating plan, aim to eat more fresh fruits and vegetables, whole grains, lean proteins, low-fat dairy, and heart-healthy fats.  Work with your health care provider or  diet and nutrition specialist (dietitian) to adjust your eating plan to your individual calorie needs. This information is not intended to replace advice given to you by your health care provider. Make sure you discuss any questions you have with your health care provider. Document Released: 12/13/2010 Document Revised: 12/18/2015 Document Reviewed: 12/18/2015 Elsevier Interactive Patient Education  2017 [place]. Hydroxyzine capsules or tablets What is this medicine? HYDROXYZINE (hye Bern i zeen) is an antihistamine. This medicine is used to treat allergy symptoms. It is also used to treat anxiety and tension. This medicine can be used with other medicines to induce sleep before surgery. This medicine may be used for other purposes; ask your health care provider or pharmacist if you have questions. COMMON BRAND NAME(S): ANX, Atarax, Rezine, Vistaril What should I tell my health care provider before I take this medicine? They need to know if you have any of these conditions: -any chronic illness -difficulty passing urine -glaucoma -heart disease -kidney disease -liver disease -lung disease -an unusual or allergic reaction to hydroxyzine, cetirizine, other medicines, foods, dyes, or preservatives -pregnant or trying to get pregnant -breast-feeding How should I use this medicine? Take this medicine by mouth with a full glass of water. Follow the directions on the prescription label. You may take this medicine with food or on an empty stomach. Take your medicine at regular intervals. Do not take your medicine more often than directed. Talk to your pediatrician regarding the use of this medicine in children. Special care may be needed. While this drug may be prescribed for children as young as 73 years of age for selected conditions, precautions do apply. Patients over 78 years old may have a stronger reaction and need a smaller dose. Overdosage: If you think you have taken too much of this  medicine contact a poison control center or emergency room at once. NOTE: This medicine is only for you. Do not share this medicine with others. What if I miss a dose? If you miss a dose, take it as soon as you can. If it is almost time for your next dose, take only that dose. Do not take double or extra doses. What may interact with this medicine? -alcohol -barbiturate medicines for sleep or seizures -medicines for colds, allergies -medicines for depression, anxiety, or emotional disturbances -medicines for pain -medicines for sleep -muscle relaxants This list may not describe all possible interactions. Give your health care provider a list of all the medicines, herbs, non-prescription drugs, or dietary supplements you use. Also tell them if you smoke, drink alcohol, or use illegal drugs. Some items may interact with your medicine. What should I watch for while using this medicine? Tell your doctor or health care professional if your symptoms do not improve. You may get drowsy or dizzy. Do not drive, use machinery, or do anything that needs mental alertness until you know how this medicine affects you. Do not stand or sit up quickly, especially if you are an older patient. This reduces the risk of dizzy or fainting spells. Alcohol may interfere with the effect of this medicine.  Avoid alcoholic drinks. Your mouth may get dry. Chewing sugarless gum or sucking hard candy, and drinking plenty of water may help. Contact your doctor if the problem does not go away or is severe. This medicine may cause dry eyes and blurred vision. If you wear contact lenses you may feel some discomfort. Lubricating drops may help. See your eye doctor if the problem does not go away or is severe. If you are receiving skin tests for allergies, tell your doctor you are using this medicine. What side effects may I notice from receiving this medicine? Side effects that you should report to your doctor or health care  professional as soon as possible: -fast or irregular heartbeat -difficulty passing urine -seizures -slurred speech or confusion -tremor Side effects that usually do not require medical attention (report to your doctor or health care professional if they continue or are bothersome): -constipation -drowsiness -fatigue -headache -stomach upset This list may not describe all possible side effects. Call your doctor for medical advice about side effects. You may report side effects to FDA at 1-800-FDA-1088. Where should I keep my medicine? Keep out of the reach of children. Store at room temperature between 15 and 30 degrees C (59 and 86 degrees F). Keep container tightly closed. Throw away any unused medicine after the expiration date. NOTE: This sheet is a summary. It may not cover all possible information. If you have questions about this medicine, talk to your doctor, pharmacist, or health care provider.  2018 Elsevier/Gold Standard (2007-05-08 14:50:59)  Living With Anxiety After being diagnosed with an anxiety disorder, you may be relieved to know why you have felt or behaved a certain way. It is natural to also feel overwhelmed about the treatment ahead and what it will mean for your life. With care and support, you can manage this condition and recover from it. How to cope with anxiety Dealing with stress Stress is your body's reaction to life changes and events, both good and bad. Stress can last just a few hours or it can be ongoing. Stress can play a major role in anxiety, so it is important to learn both how to cope with stress and how to think about it differently. Talk with your health care provider or a counselor to learn more about stress reduction. He or she may suggest some stress reduction techniques, such as:  Music therapy. This can include creating or listening to music that you enjoy and that inspires you.  Mindfulness-based meditation. This involves being aware of your  normal breaths, rather than trying to control your breathing. It can be done while sitting or walking.  Centering prayer. This is a kind of meditation that involves focusing on a word, phrase, or sacred image that is meaningful to you and that brings you peace.  Deep breathing. To do this, expand your stomach and inhale slowly through your nose. Hold your breath for 3-5 seconds. Then exhale slowly, allowing your stomach muscles to relax.  Self-talk. This is a skill where you identify thought patterns that lead to anxiety reactions and correct those thoughts.  Muscle relaxation. This involves tensing muscles then relaxing them.  Choose a stress reduction technique that fits your lifestyle and personality. Stress reduction techniques take time and practice. Set aside 5-15 minutes a day to do them. Therapists can offer training in these techniques. The training may be covered by some insurance plans. Other things you can do to manage stress include:  Keeping a stress diary. This can  help you learn what triggers your stress and ways to control your response.  Thinking about how you respond to certain situations. You may not be able to control everything, but you can control your reaction.  Making time for activities that help you relax, and not feeling guilty about spending your time in this way.  Therapy combined with coping and stress-reduction skills provides the best chance for successful treatment. Medicines Medicines can help ease symptoms. Medicines for anxiety include:  Anti-anxiety drugs.  Antidepressants.  Beta-blockers.  Medicines may be used as the main treatment for anxiety disorder, along with therapy, or if other treatments are not working. Medicines should be prescribed by a health care provider. Relationships Relationships can play a big part in helping you recover. Try to spend more time connecting with trusted friends and family members. Consider going to couples  counseling, taking family education classes, or going to family therapy. Therapy can help you and others better understand the condition. How to recognize changes in your condition Everyone has a different response to treatment for anxiety. Recovery from anxiety happens when symptoms decrease and stop interfering with your daily activities at home or work. This may mean that you will start to:  Have better concentration and focus.  Sleep better.  Be less irritable.  Have more energy.  Have improved memory.  It is important to recognize when your condition is getting worse. Contact your health care provider if your symptoms interfere with home or work and you do not feel like your condition is improving. Where to find help and support: You can get help and support from these sources:  Self-help groups.  Online and [employer].  A trusted spiritual leader.  Couples counseling.  Family education classes.  Family therapy.  Follow these instructions at home:  Eat a healthy diet that includes plenty of vegetables, fruits, whole grains, low-fat dairy products, and lean protein. Do not eat a lot of foods that are high in solid fats, added sugars, or salt.  Exercise. Most adults should do the following: ? Exercise for at least 150 minutes each week. The exercise should increase your heart rate and make you sweat (moderate-intensity exercise). ? Strengthening exercises at least twice a week.  Cut down on caffeine, tobacco, alcohol, and other potentially harmful substances.  Get the right amount and quality of sleep. Most adults need 7-9 hours of sleep each night.  Make choices that simplify your life.  Take over-the-counter and prescription medicines only as told by your health care provider.  Avoid caffeine, alcohol, and certain over-the-counter cold medicines. These may make you feel worse. Ask your pharmacist which medicines to avoid.  Keep all follow-up visits as  told by your health care provider. This is important. Questions to ask your health care provider  Would I benefit from therapy?  How often should I follow up with a health care provider?  How long do I need to take medicine?  Are there any long-term side effects of my medicine?  Are there any alternatives to taking medicine? Contact a health care provider if:  You have a hard time staying focused or finishing daily tasks.  You spend many hours a day feeling worried about everyday life.  You become exhausted by worry.  You start to have headaches, feel tense, or have nausea.  You urinate more than normal.  You have diarrhea. Get help right away if:  You have a racing heart and shortness of breath.  You have thoughts  of hurting yourself or others. If you ever feel like you may hurt yourself or others, or have thoughts about taking your own life, get help right away. You can go to your nearest emergency department or call:  Your local emergency services (911 in the [place]  A suicide crisis helpline, such as the [facility] at 440-106-2360. This is open 24-hours a day.  Summary  Taking steps to deal with stress can help calm you.  Medicines cannot cure anxiety disorders, but they can help ease symptoms.  Family, friends, and partners can play a big part in helping you recover from an anxiety disorder. This information is not intended to replace advice given to you by your health care provider. Make sure you discuss any questions you have with your health care provider. Document Released: 12/19/2015 Document Revised: 12/19/2015 Document Reviewed: 12/19/2015 Elsevier Interactive Patient Education  [employer].

## 2016-08-15 LAB — LIPID PANEL
CHOLESTEROL: 199 mg/dL (ref <200)
HDL: 78 mg/dL (ref >50)
LDL Cholesterol: 106 mg/dL — ABNORMAL HIGH (ref <100)
Total CHOL/HDL Ratio: 2.6 Ratio (ref <5.0)
Triglycerides: 76 mg/dL (ref <150)
VLDL: 15 mg/dL (ref <30)

## 2016-08-15 LAB — COMPLETE METABOLIC PANEL WITH GFR
ALBUMIN: 4.1 g/dL (ref 3.6–5.1)
ALK PHOS: 62 U/L (ref 33–130)
ALT: 19 U/L (ref 6–29)
AST: 20 U/L (ref 10–35)
BILIRUBIN TOTAL: 0.5 mg/dL (ref 0.2–1.2)
BUN: 7 mg/dL (ref 7–25)
CO2: 20 mmol/L (ref 20–32)
Calcium: 9.4 mg/dL (ref 8.6–10.4)
Chloride: 104 mmol/L (ref 98–110)
Creat: 0.76 mg/dL (ref 0.50–0.99)
GFR, Est African American: >89 mL/min (ref >=60)
GFR, Est Non African American: 85 mL/min (ref >=60)
GLUCOSE: 107 mg/dL — AB (ref 65–99)
Potassium: 4.3 mmol/L (ref 3.5–5.3)
SODIUM: 138 mmol/L (ref 135–146)
TOTAL PROTEIN: 6.5 g/dL (ref 6.1–8.1)

## 2016-08-17 NOTE — Progress Notes (Signed)
Subjective:    Patient ID: Catherine Gilmore, female    DOB: October 18, 1955, 61 y.o.   MRN: [id number]  HPI Catherine Gilmore, a 61 year old female with a history of hypertension and tobacco dependence presents to establish care. She was a patient of [employer], but has been lost to follow up since losing her insurance benefits. She says that she has continued to take triamterene-hydrochlorothiazide and losartan consistently. She has not been checking blood pressure at home. She does not exercise routinely or follow a lowfat, low sodium diet. She says that she works a very physical job and is unable to hydrate throughout her shift. She often feels dizzy and dehydrated after work. Symptoms often resolve after hydrating. Patient denies chest pain, dyspnea, irregular heart beat, palpitations and syncope. She has been a chronic everyday smoker over the past 35 years. She says that she has quit several times with success. She says that she often smokes more during periods of anxiety and depression. Depression has been controlled on Paxil 20 mg daily.  She has a history of anxiety that is triggered by family stress. She has taken ativan in the past with maximal relief.  She has the following symptoms during anxiety exacerbations: difficulty concentrating, fatigue, feelings of losing control, insomnia, irritable, racing thoughts.  She denies current suicidal and homicidal ideation.   Past Medical History:  Diagnosis Date  . Anxiety   . Back pain, chronic   . Flushing   . GERD (gastroesophageal reflux disease)   . History of gestational diabetes   . Hyperlipidemia   . Hypertension   . Hypokalemia   . Mitral valve prolapse   . Polycythemia   . Supraventricular tachycardia, paroxysmal ([place]) 01/1998,02/1998   s/p ablation and cardioversion    Social History   Social History  . Marital status: Legally Separated    Spouse name: N/A  . Number of children: N/A  . Years of education: N/A   Occupational  History  . Not on file.   Social History Main Topics  . Smoking status: Current Every Day Smoker    Packs/day: 1.00  . Smokeless tobacco: Never Used  . Alcohol use Yes     Comment: 3 to 4 times weekly   . Drug use: No  . Sexual activity: Not on file   Other Topics Concern  . Not on file   Social History Narrative  . No narrative on file   Allergies  Allergen Reactions  . Lisinopril     REACTION: Cough   Immunization History  Administered Date(s) Administered  . Influenza Split 01/24/2011, 10/17/2011  . Influenza Whole 10/02/2007, 10/05/2008, 11/22/2009  . Pneumococcal Polysaccharide-23 08/14/2016  . Tdap 01/25/2011     Review of Systems  Constitutional: Positive for fatigue.  HENT: Negative.   Eyes: Negative.   Respiratory: Positive for cough.   Cardiovascular: Negative for chest pain, palpitations and leg swelling.  Gastrointestinal: Negative.   Endocrine: Negative.  Negative for polydipsia, polyphagia and polyuria.  Genitourinary: Negative.   Neurological: Positive for light-headedness (occasionally after working).  Psychiatric/Behavioral: The patient is nervous/anxious.        Objective:   Physical Exam  Constitutional: She is oriented to person, place, and time. She appears well-developed and well-nourished.  HENT:  Head: Normocephalic and atraumatic.  Right Ear: External ear normal.  Left Ear: External ear normal.  Nose: Nose normal.  Mouth/Throat: Oropharynx is clear and moist.  Eyes: Pupils are equal, round, and reactive to light. Conjunctivae and  EOM are normal.  Neck: Normal range of motion. Neck supple.  Cardiovascular: Normal rate, regular rhythm, normal heart sounds and intact distal pulses.   Pulmonary/Chest: Effort normal.  Abdominal: Soft. Bowel sounds are normal.  Musculoskeletal: Normal range of motion.  Neurological: She is alert and oriented to person, place, and time. She has normal reflexes.  Skin: Skin is warm and dry.  Psychiatric:  Her behavior is normal. Judgment and thought content normal. Her mood appears anxious.     BP 127/72 (BP Location: Left Arm, Patient Position: Sitting, Cuff Size: Normal)   Pulse 74   Temp 98.4 F (36.9 C) (Oral)   Resp 14   Ht 5\' 4"  (1.626 m)   Wt 156 lb (70.8 kg)   SpO2 99%   BMI 26.78 kg/m   Assessment & Plan:  1. Essential hypertension Blood pressure is at goal on current medication regimen. No changes warranted at this time. Reviewed urinalysis, no proteinuria present. Renal functioning within normal limits  - triamterene-hydrochlorothiazide (MAXZIDE-25) 37.5-25 MG tablet; Take 1 tablet by mouth daily.  Dispense: 30 tablet; Refill: 11 - losartan (COZAAR) 50 MG tablet; Take 1 tablet (50 mg total) by mouth daily.  Dispense: 30 tablet; Refill: 11 - COMPLETE METABOLIC PANEL WITH GFR - CBC with Differential - EKG 12-Lead  2. Tobacco dependence Discussed smoking cessation at length. Patient in the pre-contemplative state. Educated on the dangers of smoking.   3. ANXIETY DEPRESSION Will start a trial of hydroxyzine 10 mg three times per day as needed for acute anxiety. She currently denies suicidal or homicidal intent.  - PARoxetine (PAXIL) 20 MG tablet; Take 1 tablet (20 mg total) by mouth every morning.  Dispense: 30 tablet; Refill: 11 - hydrOXYzine (ATARAX/VISTARIL) 10 MG tablet; Take 1 tablet (10 mg total) by mouth 3 (three) times daily as needed for anxiety.  Dispense: 60 tablet; Refill: 1  4. Dysthymic disorder - PARoxetine (PAXIL) 20 MG tablet; Take 1 tablet (20 mg total) by mouth every morning.  Dispense: 30 tablet; Refill: 11 - hydrOXYzine (ATARAX/VISTARIL) 10 MG tablet; Take 1 tablet (10 mg total) by mouth 3 (three) times daily as needed for anxiety.  Dispense: 60 tablet; Refill: 1  5. Other hyperlipidemia Will start ASA therapy and review lipid panel when results become available. Recommend over the counter omega 3 fatty acid.  The 10-year ASCVD risk score Mikey Bussing DC Brooke Bonito.,  et al., 2013) is: 7.9%   Values used to calculate the score:     Age: 12 years     Sex: Female     Is Non-Hispanic African American: No     Diabetic: No     Tobacco smoker: Yes     Systolic Blood Pressure: 301 mmHg     Is BP treated: Yes     HDL Cholesterol: 78 mg/dL     Total Cholesterol: 199 mg/dL  - aspirin EC 81 MG tablet; Take 1 tablet (81 mg total) by mouth daily.  Dispense: 30 tablet; Refill: 11 - Lipid Panel  6. Immunization due  - Pneumococcal polysaccharide vaccine 23-valent greater than or equal to 2yo subcutaneous/IM     RTC: 1 month for depression and anxiety   Donia Pounds  MSN, FNP-C [place], [place] [address] [phone]

## 2016-09-25 MED FILL — ?TRIAMTERENE/HCTZ 37.5/25TB: 37.5-25 | 30 days supply | Qty: 30 | Fill #1

## 2016-09-25 MED FILL — LOSARTAN POTASSIUM 50 MG TA: 50 | 30 days supply | Qty: 30 | Fill #1

## 2016-09-25 MED FILL — PARoxetine HCL 20 MG TABS: 20 | 30 days supply | Qty: 30 | Fill #1

## 2016-10-29 MED FILL — TRIAMTERENE-HCTZ 37.5-25 MG: 37.5-25 | 30 days supply | Qty: 30 | Fill #2

## 2016-10-29 MED FILL — PARoxetine HCL 20 MG TABS: 20 | 30 days supply | Qty: 30 | Fill #2

## 2016-10-29 MED FILL — LOSARTAN POTASSIUM 50 MG TA: 50 | 30 days supply | Qty: 30 | Fill #2

## 2016-11-14 ENCOUNTER — Ambulatory Visit: Payer: Managed Care, Other (non HMO) | Admitting: Family Medicine

## 2016-12-05 MED FILL — ?TRIAMTERENE/HCTZ 37.5/25TB: 37.5-25 | 30 days supply | Qty: 30 | Fill #3

## 2016-12-05 MED FILL — LOSARTAN POTASSIUM 50 MG TA: 50 | 30 days supply | Qty: 30 | Fill #3

## 2017-01-09 MED FILL — PARoxetine HCL 20 MG TABS: 20 | 30 days supply | Qty: 30 | Fill #3

## 2017-01-13 ENCOUNTER — Ambulatory Visit: Payer: Self-pay | Attending: Family Medicine

## 2017-01-13 MED FILL — ?TRIAMTERENE/HCTZ 37.5/25TB: 37.5-25 | 30 days supply | Qty: 30 | Fill #4

## 2017-01-13 MED FILL — LOSARTAN POTASSIUM 50 MG TA: 50 | 30 days supply | Qty: 30 | Fill #4

## 2017-01-16 IMAGING — US US THYROID
1 series · 13 of 25 positions shown · non-contrast
Comparison: None.

CLINICAL DATA: Thyroid nodule suspected by physical exam

EXAM:
THYROID ULTRASOUND
TECHNIQUE: Ultrasound examination of the thyroid gland and adjacent soft
tissues was performed.

[Series 1: us thyroid · 0.06mm/px · 13 of 48 slices shown]
[im 1/48]
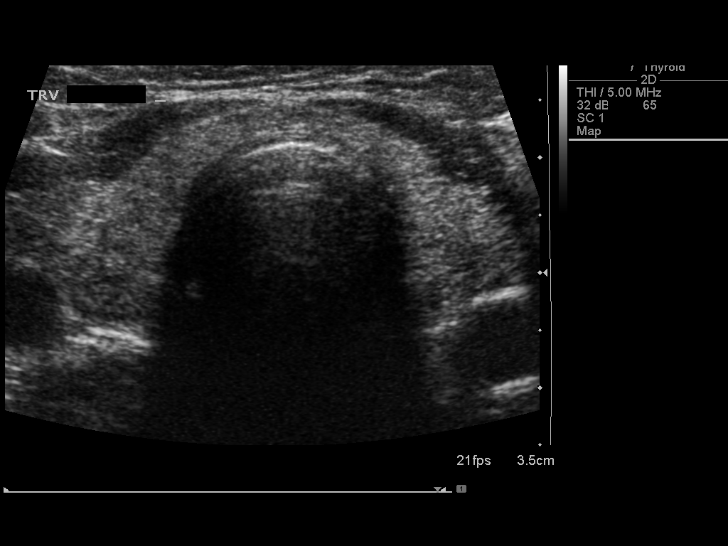
[im 4/48]
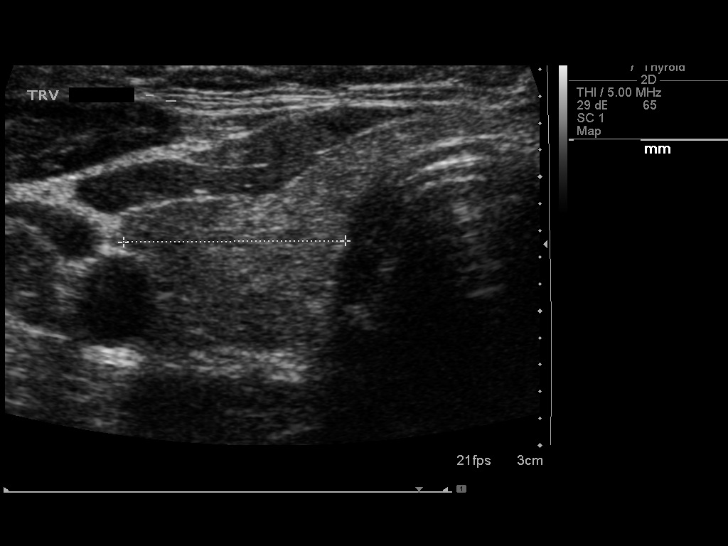
[im 8/48]
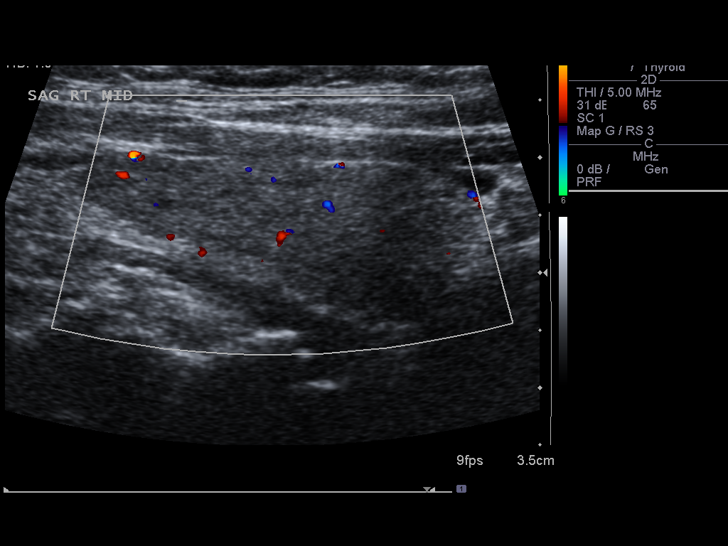
[im 12/48]
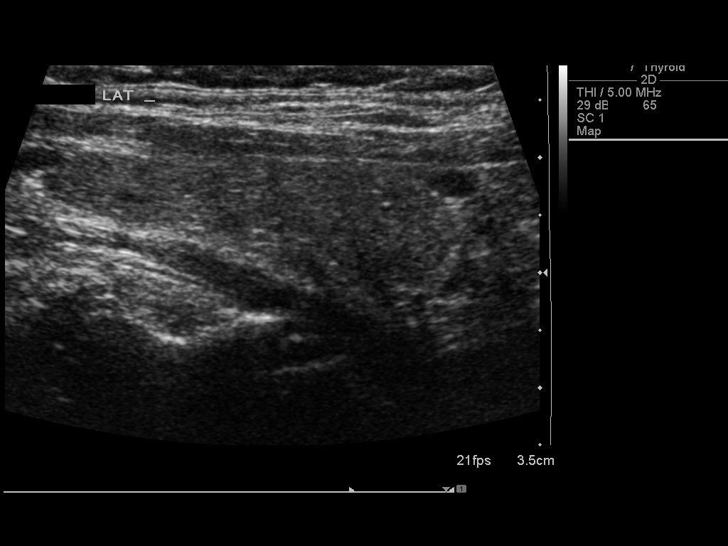
[im 16/48]
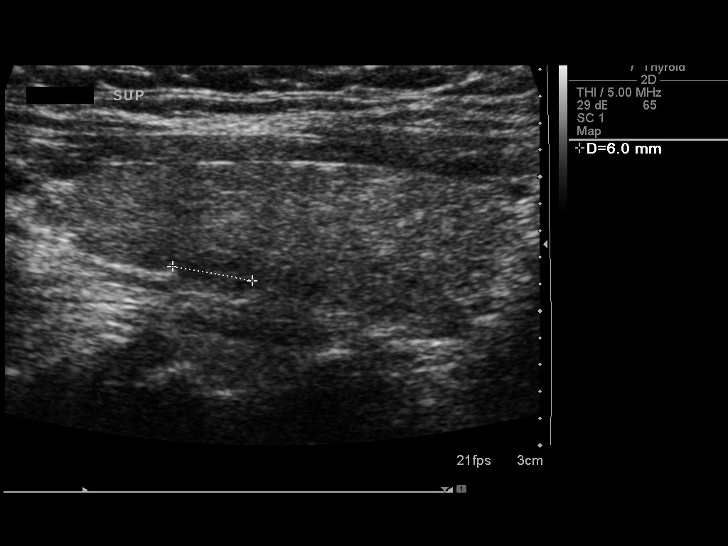
[im 20/48]
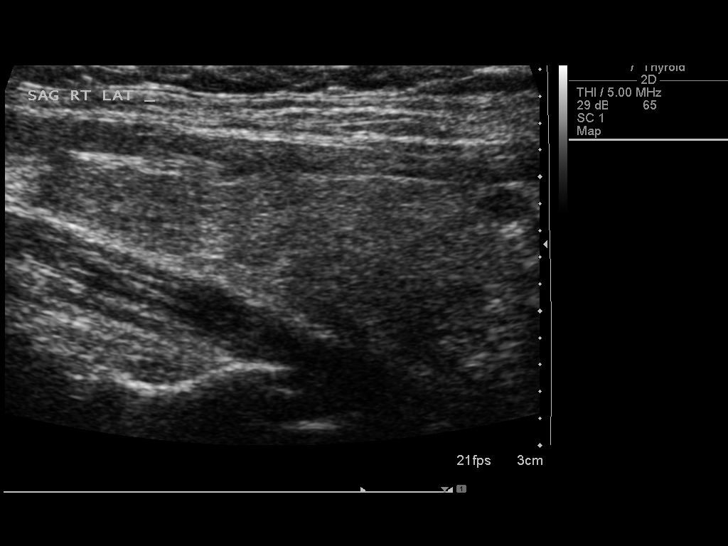
[im 24/48]
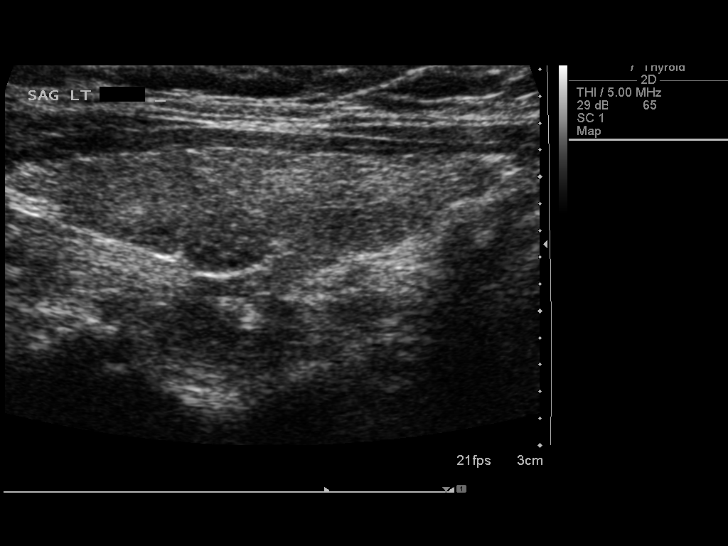
[im 28/48]
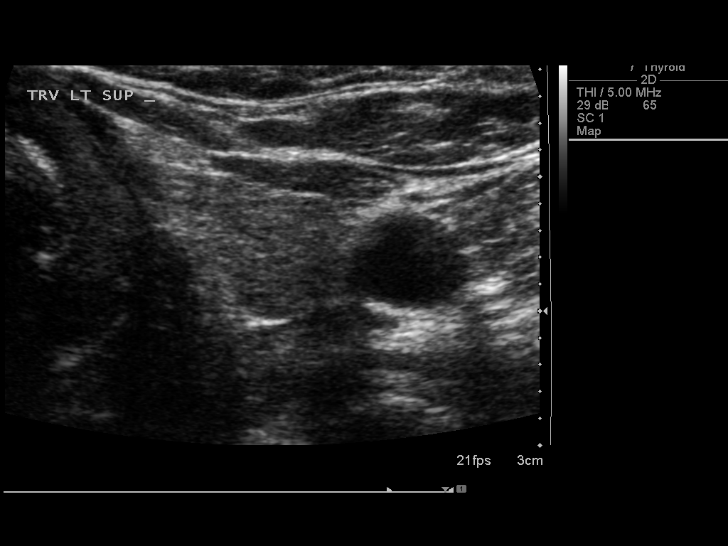
[im 32/48]
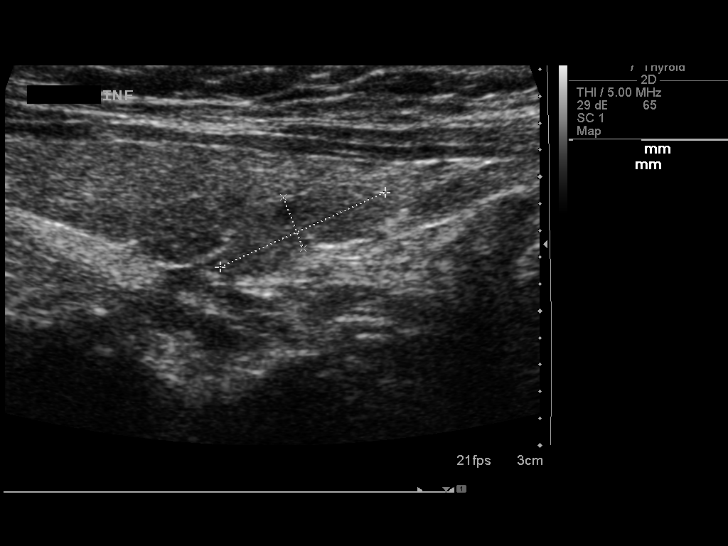
[im 36/48]
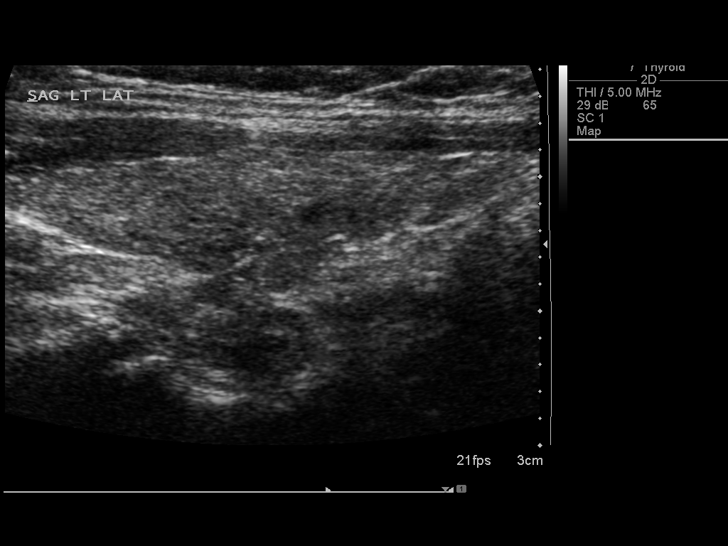
[im 40/48]
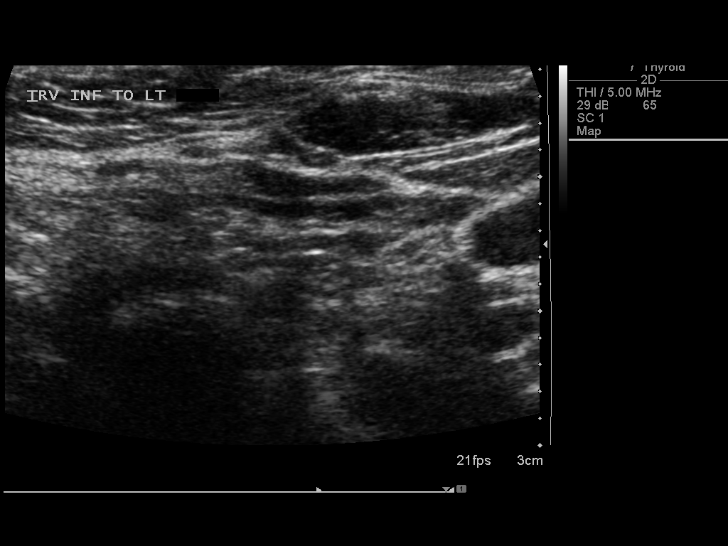
[im 44/48]
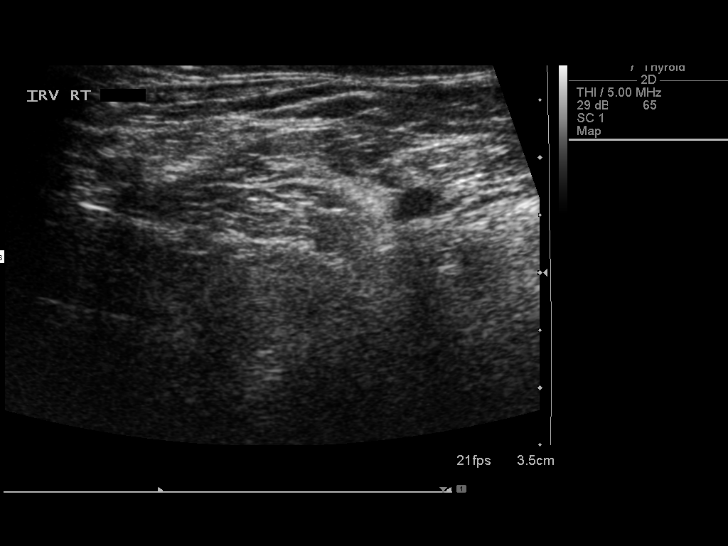
[im 48/48]
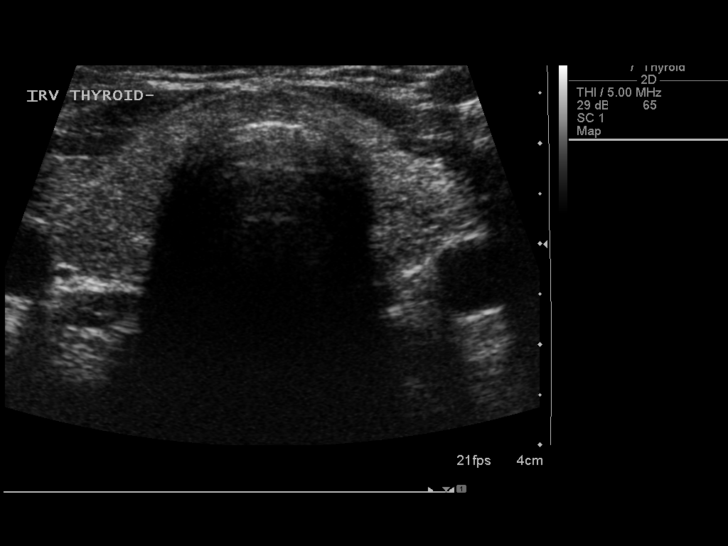

[13 of 25 positions shown; findings below may reference images not displayed]

FINDINGS: Right thyroid lobe

Measurements: 3.6 x 1.4 x 1.7 cm. Posterior upper pole hypoechoic
nodule measures 0.7 cm.

Left thyroid lobe

Measurements: 3.6 x 1.0 x 1.3 cm. There is an isoechoic nodule
posterior to the lower pole of the left lobe measuring 0.7 x 1.4 x
0.4 cm. A posterior hypoechoic upper pole nodule measures 0.7 cm.

Isthmus

Thickness: 0.4 cm.  No nodules visualized.

Lymphadenopathy

None visualized.
IMPRESSION: Small bilateral upper pole nodules measure 0.7 cm. Findings do not
meet current SRU consensus criteria for biopsy. Follow-up by
clinical exam is recommended. If patient has known risk factors for
thyroid carcinoma, consider follow-up ultrasound in 12 months. If
patient is clinically hyperthyroid, consider nuclear medicine
thyroid uptake and scan.

Reference: Management of Thyroid Nodules Detected at US: Society of
Radiologists in Ultrasound Consensus Conference Statement. Radiology
6771; [DATE].

1.4 cm nodule posterior to the lower pole of the left lobe may be of
parathyroid origin such as parathyroid adenoma. Correlate clinically
and with laboratory analysis as clinically indicated.

## 2017-02-12 ENCOUNTER — Encounter: Payer: Self-pay | Admitting: Family Medicine

## 2017-02-12 ENCOUNTER — Ambulatory Visit (INDEPENDENT_AMBULATORY_CARE_PROVIDER_SITE_OTHER): Payer: Self-pay | Admitting: Family Medicine

## 2017-02-12 ENCOUNTER — Telehealth: Payer: Self-pay | Admitting: Family Medicine

## 2017-02-12 VITALS — BP 110/71 | HR 71 | Temp 98.1°F | Resp 18 | Ht 64.0 in | Wt 154.0 lb

## 2017-02-12 DIAGNOSIS — F341 Dysthymic disorder: Secondary | ICD-10-CM

## 2017-02-12 DIAGNOSIS — R519 Headache, unspecified: Secondary | ICD-10-CM

## 2017-02-12 DIAGNOSIS — R51 Headache: Secondary | ICD-10-CM

## 2017-02-12 DIAGNOSIS — J309 Allergic rhinitis, unspecified: Secondary | ICD-10-CM | POA: Insufficient documentation

## 2017-02-12 DIAGNOSIS — I10 Essential (primary) hypertension: Secondary | ICD-10-CM

## 2017-02-12 DIAGNOSIS — J320 Chronic maxillary sinusitis: Secondary | ICD-10-CM | POA: Insufficient documentation

## 2017-02-12 LAB — POCT URINALYSIS DIP (DEVICE)
BILIRUBIN URINE: NEGATIVE
GLUCOSE, UA: NEGATIVE mg/dL
KETONES UR: NEGATIVE mg/dL
LEUKOCYTES UA: NEGATIVE
Nitrite: NEGATIVE
Protein, ur: NEGATIVE mg/dL
SPECIFIC GRAVITY, URINE: 1.02 (ref 1.005–1.030)
Urobilinogen, UA: 0.2 mg/dL (ref 0.0–1.0)
pH: 6 (ref 5.0–8.0)

## 2017-02-12 MED ORDER — HYDROXYZINE HCL 10 MG PO TABS: 10.0000 mg | ORAL_TABLET | Freq: Three times a day (TID) | ORAL | 1 refills | Status: DC | PRN

## 2017-02-12 MED ORDER — FLUTICASONE PROPIONATE 50 MCG/ACT NA SUSP: 2.0000 | Freq: Every day | NASAL | 6 refills | Status: DC

## 2017-02-12 MED ORDER — CETIRIZINE HCL 10 MG PO TABS: 10.0000 mg | ORAL_TABLET | Freq: Every day | ORAL | 11 refills | Status: DC

## 2017-02-12 MED ORDER — AMOXICILLIN-POT CLAVULANATE 875-125 MG PO TABS: 1.0000 | ORAL_TABLET | Freq: Two times a day (BID) | ORAL | 0 refills | Status: AC

## 2017-02-12 MED ORDER — PAROXETINE HCL 40 MG PO TABS: 40.0000 mg | ORAL_TABLET | ORAL | 5 refills | Status: DC

## 2017-02-12 MED FILL — ?PAROXETINE HCL 40 MG TABS: 40 | 30 days supply | Qty: 30 | Fill #0

## 2017-02-12 MED FILL — AMOX-CLAV 875-125 MG TABLET: 875-125 | 10 days supply | Qty: 20 | Fill #0

## 2017-02-12 MED FILL — FLUTICASONE PROP 50 MCG SPR: 50 | 30 days supply | Qty: 16 | Fill #0

## 2017-02-12 MED FILL — ?CETIRIZINE HCL 10 MG TABLE: 10 | 30 days supply | Qty: 30 | Fill #0

## 2017-02-12 MED FILL — hydrOXYzine HCL 10 MG TABS: 10 | 20 days supply | Qty: 60 | Fill #0

## 2017-02-12 NOTE — Progress Notes (Signed)
Subjective:    Patient ID: Catherine Gilmore, female    DOB: September 07, 1955, 62 y.o.   MRN: [id number]  HPI Catherine Gilmore, a 62 year old female with a history of hypertension and tobacco dependence presents for a follow up of chronic conditions. Patient has been taking all prescribed medications consistently. She has a history of hypertension that is controlled on triamterene-hydrochlorothiazide and Losartan. She does not exercise routinely or follow a low sodium diet. Patient denies chest pain, dyspnea, irregular heart beat, palpitations and syncope. She has been a chronic everyday smoker over the past 35 years. She says that she has quit several times with success. She says that she often smokes more during periods of anxiety and depression.   Patient is complaining of worsening depression. Depression has not been controlled on Paxil 20 mg daily.  She has a history of anxiety that is triggered by family stress. She says that she allowed her daughter and her family to move in, which is causing increased stress. She was previously started on Atarax. She has not taken medication in greater than 1 month.  She has the following symptoms during anxiety exacerbations: difficulty concentrating, fatigue, feelings of losing control, insomnia, irritable, racing thoughts.  She denies current suicidal and homicidal ideation.    Patient is also complaining of sinus pain and sinus headache for greater than 1 month. Patient has attempted OTC analgesics without relief. She also endorses pressure to left ear. She describes headache pain as intermittent, throbbing and primarily behind eyes. She has nasal congestion primarily with laying down. Patient has a history of seasonal allergies that are typically exacerbated by season change.    Past Medical History:  Diagnosis Date  . Anxiety   . Back pain, chronic   . Flushing   . GERD (gastroesophageal reflux disease)   . History of gestational diabetes   . Hyperlipidemia    . Hypertension   . Hypokalemia   . Mitral valve prolapse   . Polycythemia   . Supraventricular tachycardia, paroxysmal ([place]) 01/1998,02/1998   s/p ablation and cardioversion    Social History   Socioeconomic History  . Marital status: Legally Separated    Spouse name: Not on file  . Number of children: Not on file  . Years of education: Not on file  . Highest education level: Not on file  Social Needs  . Financial resource strain: Not on file  . Food insecurity - worry: Not on file  . Food insecurity - inability: Not on file  . Transportation needs - medical: Not on file  . Transportation needs - non-medical: Not on file  Occupational History  . Not on file  Tobacco Use  . Smoking status: Current Every Day Smoker    Packs/day: 1.00  . Smokeless tobacco: Never Used  Substance and Sexual Activity  . Alcohol use: Yes    Comment: 3 to 4 times weekly   . Drug use: No  . Sexual activity: Not on file  Other Topics Concern  . Not on file  Social History Narrative  . Not on file   Allergies  Allergen Reactions  . Lisinopril     REACTION: Cough   Immunization History  Administered Date(s) Administered  . Influenza Split 01/24/2011, 10/17/2011  . Influenza Whole 10/02/2007, 10/05/2008, 11/22/2009  . Pneumococcal Polysaccharide-23 08/14/2016  . Tdap 01/25/2011     Review of Systems  Constitutional: Positive for fatigue.  HENT: Positive for ear pain, sinus pressure and sinus pain. Negative for ear  discharge.   Eyes: Negative.   Respiratory: Positive for cough.   Cardiovascular: Negative for chest pain, palpitations and leg swelling.  Gastrointestinal: Positive for nausea.  Endocrine: Negative.  Negative for polydipsia, polyphagia and polyuria.  Genitourinary: Negative.   Neurological: Positive for light-headedness (occasionally after working) and headaches.  Psychiatric/Behavioral: The patient is nervous/anxious.        Objective:   Physical Exam  Constitutional:  She is oriented to person, place, and time. She appears well-developed and well-nourished.  HENT:  Head: Normocephalic and atraumatic.  Right Ear: External ear normal.  Left Ear: External ear normal.  Nose: Mucosal edema present. Right sinus exhibits maxillary sinus tenderness. Left sinus exhibits maxillary sinus tenderness.  Mouth/Throat: Oropharyngeal exudate present.  Bilateral TM are dull, increase erythema to right ear canal  Eyes: Conjunctivae and EOM are normal. Pupils are equal, round, and reactive to light.  Neck: Normal range of motion. Neck supple.  Cardiovascular: Normal rate, regular rhythm, normal heart sounds and intact distal pulses.  Pulmonary/Chest: Effort normal.  Abdominal: Soft. Bowel sounds are normal.  Musculoskeletal: Normal range of motion.  Neurological: She is alert and oriented to person, place, and time. She has normal reflexes.  Skin: Skin is warm and dry.  Psychiatric: Her behavior is normal. Judgment and thought content normal. Her mood appears anxious. She exhibits a depressed mood.  Tearful during examination     BP 110/71 (BP Location: Left Arm, Patient Position: Sitting, Cuff Size: Normal)   Pulse 71   Temp 98.1 F (36.7 C)   Resp 18   Ht 5\' 4"  (1.626 m)   Wt 154 lb (69.9 kg)   SpO2 98%   BMI 26.43 kg/m   Assessment & Plan:  1. Essential hypertension Blood pressure is at goal on current medication regimen, no changes warranted.  Reviewed urinalysis no proteinuria present.  Will review renal functioning as results become available. - Continue medication, monitor blood pressure at home. Continue DASH diet. Reminder to go to the ER if any CP, SOB, nausea, dizziness, severe HA, changes vision/speech, left arm numbness and tingling and jaw pain.    - Basic Metabolic Panel - Lipid Panel - Urinalysis Dipstick  2. Dysthymic disorder Reviewed PHQ 9, which is markedly elevated.  Will increase Paxil to 40 mg daily.  Also recommended that patient  follow-up with [facility] walk-in clinic. - PARoxetine (PAXIL) 40 MG tablet; Take 1 tablet (40 mg total) by mouth every morning.  Dispense: 30 tablet; Refill: 5 - hydrOXYzine (ATARAX/VISTARIL) 10 MG tablet; Take 1 tablet (10 mg total) by mouth 3 (three) times daily as needed for anxiety.  Dispense: 60 tablet; Refill: 1  3. ANXIETY DEPRESSION We will restart hydroxyzine 10 mg 3 times daily as needed for acute anxiety. - PARoxetine (PAXIL) 40 MG tablet; Take 1 tablet (40 mg total) by mouth every morning.  Dispense: 30 tablet; Refill: 5 - hydrOXYzine (ATARAX/VISTARIL) 10 MG tablet; Take 1 tablet (10 mg total) by mouth 3 (three) times daily as needed for anxiety.  Dispense: 60 tablet; Refill: 1  4. Chronic maxillary sinusitis We will start Augmentin 875-125 mg twice daily for 10 days.  Also recommend fluticasone nasal spray 2 sprays to each nostril twice daily for 7 days - amoxicillin-clavulanate (AUGMENTIN) 875-125 MG tablet; Take 1 tablet by mouth 2 (two) times daily for 10 days.  Dispense: 20 tablet; Refill: 0 - fluticasone (FLONASE) 50 MCG/ACT nasal spray; Place 2 sprays into both nostrils daily for 7 days.  Dispense:  16 g; Refill: 6  5. Sinus headache Recommend over-the-counter Tylenol 500 mg every 6 hours as needed for mild to moderate headache pain  6. Allergic rhinitis, unspecified seasonality, unspecified trigger We will start a trial of cetirizine 10 mg at bedtime for allergic rhinitis - cetirizine (ZYRTEC) 10 MG tablet; Take 1 tablet (10 mg total) by mouth daily.  Dispense: 30 tablet; Refill: 11   RTC: Follow-up in 4 weeks for depression and anxiety The patient was given clear instructions to go to ER or return to medical center if symptoms do not improve, worsen or new problems develop. The patient verbalized understanding. Will notify patient with laboratory results.  Greater than 50% of visit spent face-to-face discussing diagnoses and treatment plan Donia Pounds  MSN, FNP-C [facility] [address] [place], [place] [address] [phone]

## 2017-02-12 NOTE — Patient Instructions (Signed)
For chronic maxillary sinusitis, will start Augmentin 875-125 mg twice daily for 10 days.  We will also start Flonase 1 spray to each nare twice daily for 7 days.  For allergic rhinitis will start cetirizine 10 mg at bedtime.  Increase water intake, rest, and handwashing.  Recommend increasing vitamin C.  For ongoing depression and anxiety will increase Paxil to 40 mg daily.  We will also restart hydroxyzine 10 mg 3 times a day as needed for increased anxiety.  Refrain from drinking alcohol, driving, or operating machinery while taking this medication being that it may cause sedating effects. Also recommend establishing care with [facility] behavioral health.  They have a walk-in clinic open on Monday through Friday from 8 AM to 3 PM.  The address is as follows: [address]    your blood pressure is at goal on current medication regimen, no changes warranted.  Continue low-sodium, low-fat diet divided over 5-6 small meals throughout the day.    Marland Kitchen

## 2017-02-12 NOTE — Telephone Encounter (Signed)
Pt came in to request a call back with details on the status of her CAFA/OC please follow up

## 2017-02-13 ENCOUNTER — Other Ambulatory Visit: Payer: Self-pay | Admitting: Family Medicine

## 2017-02-13 ENCOUNTER — Telehealth: Payer: Self-pay

## 2017-02-13 DIAGNOSIS — E785 Hyperlipidemia, unspecified: Secondary | ICD-10-CM

## 2017-02-13 LAB — BASIC METABOLIC PANEL
BUN/Creatinine Ratio: 15 (ref 12–28)
BUN: 12 mg/dL (ref 8–27)
CALCIUM: 9.3 mg/dL (ref 8.7–10.3)
CO2: 24 mmol/L (ref 20–29)
CREATININE: 0.81 mg/dL (ref 0.57–1.00)
Chloride: 97 mmol/L (ref 96–106)
GFR, EST AFRICAN AMERICAN: 91 mL/min/{1.73_m2} (ref >59)
GFR, EST NON AFRICAN AMERICAN: 79 mL/min/{1.73_m2} (ref >59)
Glucose: 106 mg/dL — ABNORMAL HIGH (ref 65–99)
Potassium: 4.4 mmol/L (ref 3.5–5.2)
Sodium: 135 mmol/L (ref 134–144)

## 2017-02-13 LAB — LIPID PANEL
CHOL/HDL RATIO: 2.4 ratio (ref 0.0–4.4)
Cholesterol, Total: 232 mg/dL — ABNORMAL HIGH (ref 100–199)
HDL: 97 mg/dL (ref >39)
LDL Calculated: 121 mg/dL — ABNORMAL HIGH (ref 0–99)
TRIGLYCERIDES: 68 mg/dL (ref 0–149)
VLDL CHOLESTEROL CAL: 14 mg/dL (ref 5–40)

## 2017-02-13 MED ORDER — ATORVASTATIN CALCIUM 20 MG PO TABS: 20.0000 mg | ORAL_TABLET | Freq: Every day | ORAL | 3 refills | Status: DC

## 2017-02-13 MED FILL — ?ATORVASTATIN 20MG TABL: 20 | 30 days supply | Qty: 30 | Fill #0

## 2017-02-13 NOTE — Telephone Encounter (Signed)
Patient notified of results.

## 2017-02-13 NOTE — Telephone Encounter (Signed)
-----   Message from Dorena Dew, [place] sent at 02/13/2017  6:03 AM EST ----- Regarding: lab results Please inform patient that total cholesterol is 232, which is above goal. Goal is < 200. Also, LDL is 121, goal is < 100. Will add Atorvastin 20 mg, a cholesterol lower agent to current medication regimen. Recommend a lowfat, low carbohydrate diet divided over 5-6 small meals, increase water intake to 6-8 glasses, and 150 minutes per week of cardiovascular exercise.   Follow up in office as scheduled.   Thanks

## 2017-02-13 NOTE — Progress Notes (Signed)
Meds ordered this encounter  Medications  . atorvastatin (LIPITOR) 20 MG tablet    Sig: Take 1 tablet (20 mg total) by mouth daily.    Dispense:  90 tablet    Refill:  3  The 10-year ASCVD risk score Mikey Bussing DC Jr., et al., 2013) is: 5.8%   Values used to calculate the score:     Age: 62 years     Sex: Female     Is Non-Hispanic African American: No     Diabetic: No     Tobacco smoker: Yes     Systolic Blood Pressure: 315 mmHg     Is BP treated: Yes     HDL Cholesterol: 97 mg/dL     Total Cholesterol: 232 mg/dL    Donia Pounds  MSN, FNP-C [facility] [place] [address] [place], [place] [address] [phone]

## 2017-02-26 MED FILL — TRIAMTERENE/HCTZ 37.5/25 TB: 37.5-25 | 30 days supply | Qty: 30 | Fill #5

## 2017-02-26 MED FILL — LOSARTAN POTASSIUM 50 MG TA: 50 | 30 days supply | Qty: 30 | Fill #5

## 2017-03-03 ENCOUNTER — Ambulatory Visit (INDEPENDENT_AMBULATORY_CARE_PROVIDER_SITE_OTHER): Payer: Self-pay | Admitting: Family Medicine

## 2017-03-03 ENCOUNTER — Encounter: Payer: Self-pay | Admitting: Family Medicine

## 2017-03-03 VITALS — BP 126/80 | HR 68 | Temp 98.2°F | Resp 16 | Ht 64.0 in | Wt 154.0 lb

## 2017-03-03 DIAGNOSIS — J309 Allergic rhinitis, unspecified: Secondary | ICD-10-CM

## 2017-03-03 DIAGNOSIS — F17200 Nicotine dependence, unspecified, uncomplicated: Secondary | ICD-10-CM

## 2017-03-03 DIAGNOSIS — J320 Chronic maxillary sinusitis: Secondary | ICD-10-CM

## 2017-03-03 MED ORDER — AZITHROMYCIN 250 MG PO TABS: ORAL_TABLET | ORAL | 0 refills | Status: DC

## 2017-03-03 MED FILL — AZITHROMYCIN 250 MG TABLET: 250 | 5 days supply | Qty: 6 | Fill #0

## 2017-03-03 NOTE — Patient Instructions (Signed)
You have chronic maxillary sinusitis along with allergic rhinitis. Will start a trial of Azithromycin 500 mg on today and 250 mg on days 2-5. Continue cetirizine 10 mg at bedtime for allergies Use Flonase 50 mcg/act 2 sprays to each nare for 7 days.

## 2017-03-03 NOTE — Progress Notes (Signed)
Chief Complaint  Patient presents with  . Cough  . Sinus Problem    pressure   . Nasal Congestion     Subjective:    Patient ID: Catherine Gilmore, female    DOB: Jul 07, 1955, 62 y.o.   MRN: [id number]  HPI Catherine Gilmore, a 62 year old female with a history of hypertension and tobacco dependence presents complaining of sinus pain. Patient was treated with augmentin 2 weeks ago.  Patient is also complaining of sinus pain and sinus headache for greater than 1 month. Patient has attempted OTC analgesics without relief. She also endorses pressure to left ear, cough, and nasal congestion.. She describes headache pain as intermittent, throbbing and primarily behind eyes. She has nasal congestion primarily with laying down. Patient has a history of seasonal allergies that are typically exacerbated by season change.    Past Medical History:  Diagnosis Date  . Anxiety   . Back pain, chronic   . Flushing   . GERD (gastroesophageal reflux disease)   . History of gestational diabetes   . Hyperlipidemia   . Hypertension   . Hypokalemia   . Mitral valve prolapse   . Polycythemia   . Supraventricular tachycardia, paroxysmal ([place]) 01/1998,02/1998   s/p ablation and cardioversion    Social History   Socioeconomic History  . Marital status: Legally Separated    Spouse name: Not on file  . Number of children: Not on file  . Years of education: Not on file  . Highest education level: Not on file  Social Needs  . Financial resource strain: Not on file  . Food insecurity - worry: Not on file  . Food insecurity - inability: Not on file  . Transportation needs - medical: Not on file  . Transportation needs - non-medical: Not on file  Occupational History  . Not on file  Tobacco Use  . Smoking status: Current Every Day Smoker    Packs/day: 1.00  . Smokeless tobacco: Never Used  Substance and Sexual Activity  . Alcohol use: Yes    Comment: 3 to 4 times weekly   . Drug use: No  . Sexual  activity: Not on file  Other Topics Concern  . Not on file  Social History Narrative  . Not on file   Allergies  Allergen Reactions  . Lisinopril     REACTION: Cough   Immunization History  Administered Date(s) Administered  . Influenza Split 01/24/2011, 10/17/2011  . Influenza Whole 10/02/2007, 10/05/2008, 11/22/2009  . Pneumococcal Polysaccharide-23 08/14/2016  . Tdap 01/25/2011     Review of Systems  Constitutional: Positive for fatigue.  HENT: Positive for ear pain, sinus pressure and sinus pain. Negative for ear discharge.   Eyes: Negative.   Respiratory: Positive for cough.   Cardiovascular: Negative for chest pain, palpitations and leg swelling.  Gastrointestinal: Positive for nausea.  Endocrine: Negative.  Negative for polydipsia, polyphagia and polyuria.  Genitourinary: Negative.   Neurological: Positive for light-headedness.       Objective:   Physical Exam  Constitutional: She is oriented to person, place, and time. She appears well-developed and well-nourished.  HENT:  Head: Normocephalic and atraumatic.  Right Ear: External ear normal.  Left Ear: External ear normal.  Nose: Mucosal edema present. Right sinus exhibits maxillary sinus tenderness. Left sinus exhibits maxillary sinus tenderness.  Mouth/Throat: Oropharyngeal exudate present.  Bilateral TM are dull, increase erythema to right ear canal  Eyes: Conjunctivae and EOM are normal. Pupils are equal, round, and reactive to light.  Neck: Normal range of motion. Neck supple.  Cardiovascular: Normal rate, regular rhythm, normal heart sounds and intact distal pulses.  Pulmonary/Chest: Effort normal.  Abdominal: Soft. Bowel sounds are normal.  Musculoskeletal: Normal range of motion.  Neurological: She is alert and oriented to person, place, and time. She has normal reflexes.  Skin: Skin is warm and dry.  Psychiatric: Her behavior is normal. Judgment and thought content normal.     BP 126/80 (BP  Location: Right Arm, Patient Position: Sitting, Cuff Size: Normal)   Pulse 68   Temp 98.2 F (36.8 C) (Oral)   Resp 16   Ht 5\' 4"  (1.626 m)   Wt 154 lb (69.9 kg)   SpO2 98%   BMI 26.43 kg/m   Assessment & Plan:  1. Chronic maxillary sinusitis - azithromycin (ZITHROMAX) 250 MG tablet; Take 500 mg today, and 250 mg on days 2-5  Dispense: 6 tablet; Refill: 0 -We will also continue cetirizine 10 mg at bedtime -Increase rest, fluid intake, and handwashing -Continue fluticasone 1 spray to each nare twice daily  2. Allergic rhinitis, unspecified seasonality, unspecified trigger -Continue cetirizine 10 mg at bedtime  3. Tobacco dependence Smoking cessation instruction/counseling given:  counseled patient on the dangers of tobacco use, advised patient to stop smoking, and reviewed strategies to maximize success    RTC: As previously scheduled  Donia Pounds  MSN, FNP-C [facility] [address] [place], [place] [address] [phone]

## 2017-03-26 ENCOUNTER — Ambulatory Visit: Payer: Self-pay | Admitting: Family Medicine

## 2017-03-31 MED FILL — ?ATORVASTATIN 20MG TABL: 20 | 30 days supply | Qty: 30 | Fill #1

## 2017-04-07 MED FILL — ?PAROXETINE HCL 40 MG TABS: 40 | 30 days supply | Qty: 30 | Fill #1

## 2017-04-09 MED FILL — LOSARTAN POTASSIUM 50 MG TA: 50 | 30 days supply | Qty: 30 | Fill #6

## 2017-04-09 MED FILL — TRIAMTERENE/HCTZ 37.5/25 TB: 37.5-25 | 30 days supply | Qty: 30 | Fill #6

## 2017-04-16 ENCOUNTER — Ambulatory Visit: Payer: Self-pay | Admitting: Family Medicine

## 2017-05-14 MED FILL — TRIAMTERENE/HCTZ 37.5/25 TB: 37.5-25 | 30 days supply | Qty: 30 | Fill #7

## 2017-05-14 MED FILL — ?ATORVASTATIN 20 MG TABLET: 20 | 30 days supply | Qty: 30 | Fill #2

## 2017-05-14 MED FILL — LOSARTAN POTASSIUM 50 MG TA: 50 | 30 days supply | Qty: 30 | Fill #7

## 2017-05-14 MED FILL — PARoxetine HCL 40 MG TABS: 40 | 30 days supply | Qty: 30 | Fill #2

## 2017-05-26 ENCOUNTER — Telehealth: Payer: Self-pay

## 2017-05-26 ENCOUNTER — Other Ambulatory Visit: Payer: Self-pay | Admitting: Family Medicine

## 2017-05-26 DIAGNOSIS — E785 Hyperlipidemia, unspecified: Secondary | ICD-10-CM

## 2017-05-26 MED ORDER — PRAVASTATIN SODIUM 20 MG PO TABS: 20.0000 mg | ORAL_TABLET | Freq: Every day | ORAL | 1 refills | Status: DC

## 2017-05-26 MED FILL — PRAVASTATIN SODIUM 20 MG TA: 20 | 30 days supply | Qty: 30 | Fill #0

## 2017-05-26 NOTE — Telephone Encounter (Signed)
Patient states she has always taken pravastatin and when she was her in February she was given Atorvastatin. She has just picked this up but has not started taking yet. She is asking if you can switch her back to pravastatin. If so can you please send to [facility]. Please advise. Thanks!

## 2017-05-26 NOTE — Progress Notes (Signed)
Meds ordered this encounter  Medications  . pravastatin (PRAVACHOL) 20 MG tablet    Sig: Take 1 tablet (20 mg total) by mouth daily.    Dispense:  90 tablet    Refill:  Williams  MSN, FNP-C [facility] [address] [place], [place] [address] [phone]

## 2017-06-18 MED FILL — PRAVASTATIN SODIUM 20 MG TA: 20 | 30 days supply | Qty: 30 | Fill #1

## 2017-06-18 MED FILL — TRIAMTERENE/HCTZ 37.5/25 TB: 37.5-25 | 30 days supply | Qty: 30 | Fill #8

## 2017-06-18 MED FILL — LOSARTAN POTASSIUM 50 MG TA: 50 | 30 days supply | Qty: 30 | Fill #8

## 2017-06-18 MED FILL — PARoxetine HCL 40 MG TABS: 40 | 30 days supply | Qty: 30 | Fill #3

## 2017-07-28 MED FILL — PRAVASTATIN SODIUM 20 MG TA: 20 | 30 days supply | Qty: 30 | Fill #2

## 2017-07-28 MED FILL — TRIAMTERENE/HCTZ 37.5/25 TB: 37.5-25 | 30 days supply | Qty: 30 | Fill #9

## 2017-07-28 MED FILL — LOSARTAN POTASSIUM 50 MG TA: 50 | 30 days supply | Qty: 30 | Fill #9

## 2017-07-28 MED FILL — ?PAROXETINE HCL 40 MG TABS: 40 | 30 days supply | Qty: 30 | Fill #4

## 2017-09-01 MED FILL — PARoxetine HCL 40 MG TABS: 40 | 30 days supply | Qty: 30 | Fill #5

## 2017-09-01 MED FILL — PRAVASTATIN SODIUM 20 MG TA: 20 | 30 days supply | Qty: 30 | Fill #3

## 2017-09-04 ENCOUNTER — Encounter: Payer: Self-pay | Admitting: Family Medicine

## 2017-09-04 ENCOUNTER — Ambulatory Visit (INDEPENDENT_AMBULATORY_CARE_PROVIDER_SITE_OTHER): Payer: Self-pay | Admitting: Family Medicine

## 2017-09-04 VITALS — BP 135/92 | HR 98 | Temp 99.4°F | Resp 14 | Ht 64.0 in | Wt 156.0 lb

## 2017-09-04 DIAGNOSIS — E785 Hyperlipidemia, unspecified: Secondary | ICD-10-CM

## 2017-09-04 DIAGNOSIS — K219 Gastro-esophageal reflux disease without esophagitis: Secondary | ICD-10-CM

## 2017-09-04 DIAGNOSIS — N644 Mastodynia: Secondary | ICD-10-CM

## 2017-09-04 DIAGNOSIS — Z8489 Family history of other specified conditions: Secondary | ICD-10-CM

## 2017-09-04 DIAGNOSIS — I10 Essential (primary) hypertension: Secondary | ICD-10-CM

## 2017-09-04 DIAGNOSIS — F341 Dysthymic disorder: Secondary | ICD-10-CM

## 2017-09-04 MED ORDER — PAROXETINE HCL 40 MG PO TABS: 40.0000 mg | ORAL_TABLET | ORAL | 5 refills | Status: DC

## 2017-09-04 MED ORDER — LOSARTAN POTASSIUM 50 MG PO TABS: 50.0000 mg | ORAL_TABLET | Freq: Every day | ORAL | 11 refills | Status: DC

## 2017-09-04 MED ORDER — PRAVASTATIN SODIUM 20 MG PO TABS: 20.0000 mg | ORAL_TABLET | Freq: Every day | ORAL | 1 refills | Status: DC

## 2017-09-04 MED ORDER — TRIAMTERENE-HCTZ 37.5-25 MG PO TABS: 1.0000 | ORAL_TABLET | Freq: Every day | ORAL | 11 refills | Status: DC

## 2017-09-04 MED ORDER — CEPHALEXIN 500 MG PO CAPS: 500.0000 mg | ORAL_CAPSULE | Freq: Two times a day (BID) | ORAL | 0 refills | Status: AC

## 2017-09-04 MED ORDER — OMEPRAZOLE 20 MG PO CPDR: 20.0000 mg | DELAYED_RELEASE_CAPSULE | Freq: Every day | ORAL | 11 refills | Status: DC

## 2017-09-04 MED FILL — TRIAMTERENE/HCTZ 37.5/25 TB: 37.5-25 | 30 days supply | Qty: 30 | Fill #0

## 2017-09-04 MED FILL — CEPHALEXIN 500 MG CAPSULE: 500 | 10 days supply | Qty: 20 | Fill #0

## 2017-09-04 MED FILL — LOSARTAN POTASSIUM 50 MG TA: 50 | 30 days supply | Qty: 30 | Fill #0

## 2017-09-04 MED FILL — OMEPRAZOLE 20 MG CAP: 20 | 30 days supply | Qty: 30 | Fill #0

## 2017-09-04 NOTE — Patient Instructions (Addendum)
I am sending a referral to the breast center for further evaluation of your breast pain and swelling. I also sent an antibiotic to the pharmacy.    Sinusitis, Adult Sinusitis is soreness and inflammation of your sinuses. Sinuses are hollow spaces in the bones around your face. They are located:  Around your eyes.  In the middle of your forehead.  Behind your nose.  In your cheekbones.  Your sinuses and nasal passages are lined with a stringy fluid (mucus). Mucus normally drains out of your sinuses. When your nasal tissues get inflamed or swollen, the mucus can get trapped or blocked so air cannot flow through your sinuses. This lets bacteria, viruses, and funguses grow, and that leads to infection. Follow these instructions at home: Medicines  Take, use, or apply over-the-counter and prescription medicines only as told by your doctor. These may include nasal sprays.  If you were prescribed an antibiotic medicine, take it as told by your doctor. Do not stop taking the antibiotic even if you start to feel better. Hydrate and Humidify  Drink enough water to keep your pee (urine) clear or pale yellow.  Use a cool mist humidifier to keep the humidity level in your home above 50%.  Breathe in steam for 10-15 minutes, 3-4 times a day or as told by your doctor. You can do this in the bathroom while a hot shower is running.  Try not to spend time in cool or dry air. Rest  Rest as much as possible.  Sleep with your head raised (elevated).  Make sure to get enough sleep each night. General instructions  Put a warm, moist washcloth on your face 3-4 times a day or as told by your doctor. This will help with discomfort.  Wash your hands often with soap and water. If there is no soap and water, use hand sanitizer.  Do not smoke. Avoid being around people who are smoking (secondhand smoke).  Keep all follow-up visits as told by your doctor. This is important. Contact a doctor if:  You  have a fever.  Your symptoms get worse.  Your symptoms do not get better within 10 days. Get help right away if:  You have a very bad headache.  You cannot stop throwing up (vomiting).  You have pain or swelling around your face or eyes.  You have trouble seeing.  You feel confused.  Your neck is stiff.  You have trouble breathing. This information is not intended to replace advice given to you by your health care provider. Make sure you discuss any questions you have with your health care provider. Document Released: 06/12/2007 Document Revised: 08/20/2015 Document Reviewed: 10/19/2014 Elsevier Interactive Patient Education  [employer].

## 2017-09-04 NOTE — Progress Notes (Signed)
[facility]  Provider: Lanae Boast, FNP   Hypertension Follow Up Visit  SUBJECTIVE:   Catherine Gilmore is a 62 y.o. female who  has a past medical history of Anxiety, Back pain, chronic, Flushing, GERD (gastroesophageal reflux disease), History of gestational diabetes, Hyperlipidemia, Hypertension, Hypokalemia, Mitral valve prolapse, Polycythemia, and Supraventricular tachycardia, paroxysmal ([place]) (01/1998,02/1998). .   New concerns: Patient states that she is having pain and swelling on the left breast, axilla and neck. Denies injury to the area. Patient reports a family hx of breast cancer in her family: sister and maternal aunt. She denies recent mammogram or self breast exams.   Patient states that she is doing well on the increase of paxil. She states that she still continues with sad days. Has had a death in the family 3 weeks prior to this visit.  Denies SI, HI, AH, VH.   Current Outpatient Medications  Medication Sig Dispense Refill  . losartan (COZAAR) 50 MG tablet Take 1 tablet (50 mg total) by mouth daily. 30 tablet 11  . omeprazole (PRILOSEC) 20 MG capsule Take 1 capsule (20 mg total) by mouth daily. 30 capsule 11  . PARoxetine (PAXIL) 40 MG tablet Take 1 tablet (40 mg total) by mouth every morning. 30 tablet 5  . pravastatin (PRAVACHOL) 20 MG tablet Take 1 tablet (20 mg total) by mouth daily. 90 tablet 1  . triamterene-hydrochlorothiazide (MAXZIDE-25) 37.5-25 MG tablet Take 1 tablet by mouth daily. 30 tablet 11  . aspirin EC 81 MG tablet Take 1 tablet (81 mg total) by mouth daily. (Patient not taking: Reported on 02/12/2017) 30 tablet 11  . cephALEXin (KEFLEX) 500 MG capsule Take 1 capsule (500 mg total) by mouth 2 (two) times daily for 10 days. 20 capsule 0  . cetirizine (ZYRTEC) 10 MG tablet Take 1 tablet (10 mg total) by mouth daily. (Patient not taking: Reported on 09/04/2017) 30 tablet 11  . fluticasone (FLONASE) 50 MCG/ACT  nasal spray Place 2 sprays into both nostrils daily for 7 days. (Patient not taking: Reported on 09/04/2017) 16 g 6  . hydrOXYzine (ATARAX/VISTARIL) 10 MG tablet Take 1 tablet (10 mg total) by mouth 3 (three) times daily as needed for anxiety. (Patient not taking: Reported on 09/04/2017) 60 tablet 1   No current facility-administered medications for this visit.     Recent Results (from the past 2160 hour(s))  Comprehensive metabolic panel     Status: Abnormal   Collection Time: 09/04/17 11:39 AM  Result Value Ref Range   Glucose 112 (H) 65 - 99 mg/dL   BUN 8 8 - 27 mg/dL   Creatinine, Ser 0.67 0.57 - 1.00 mg/dL   GFR calc non Af Amer 95 >59 mL/min/1.73   GFR calc Af Amer 109 >59 mL/min/1.73   BUN/Creatinine Ratio 12 12 - 28   Sodium 138 134 - 144 mmol/L   Potassium 3.8 3.5 - 5.2 mmol/L   Chloride 96 96 - 106 mmol/L   CO2 26 20 - 29 mmol/L   Calcium 9.5 8.7 - 10.3 mg/dL   Total Protein 6.8 6.0 - 8.5 g/dL   Albumin 4.6 3.6 - 4.8 g/dL   Globulin, Total 2.2 1.5 - 4.5 g/dL   Albumin/Globulin Ratio 2.1 1.2 - 2.2   Bilirubin Total 0.5 0.0 - 1.2 mg/dL   Alkaline Phosphatase 63 39 - 117 IU/L   AST 25 0 - 40 IU/L   ALT 24 0 - 32 IU/L  Lipid  Panel     Status: Abnormal   Collection Time: 09/04/17 11:39 AM  Result Value Ref Range   Cholesterol, Total 206 (H) 100 - 199 mg/dL   Triglycerides 60 0 - 149 mg/dL   HDL 107 >39 mg/dL   VLDL Cholesterol Cal 12 5 - 40 mg/dL   LDL Calculated 87 0 - 99 mg/dL   Chol/HDL Ratio 1.9 0.0 - 4.4 ratio    Comment:                                   T. Chol/HDL Ratio                                             Men  Women                               1/2 Avg.Risk  3.4    3.3                                   Avg.Risk  5.0    4.4                                2X Avg.Risk  9.6    7.1                                3X Avg.Risk 23.4   11.0   CBC with Differential     Status: Abnormal   Collection Time: 09/04/17 11:39 AM  Result Value Ref Range   WBC 8.5 3.4 -  10.8 x10E3/uL   RBC 4.81 3.77 - 5.28 x10E6/uL   Hemoglobin 15.4 11.1 - 15.9 g/dL   Hematocrit 44.6 34.0 - 46.6 %   MCV 93 79 - 97 fL   MCH 32.0 26.6 - 33.0 pg   MCHC 34.5 31.5 - 35.7 g/dL   RDW 12.3 12.3 - 15.4 %   Platelets 312 150 - 450 x10E3/uL   Neutrophils 65 Not Estab. %   Lymphs 20 Not Estab. %   Monocytes 7 Not Estab. %   Eos 7 Not Estab. %   Basos 1 Not Estab. %   Neutrophils Absolute 5.6 1.4 - 7.0 x10E3/uL   Lymphocytes Absolute 1.7 0.7 - 3.1 x10E3/uL   Monocytes Absolute 0.6 0.1 - 0.9 x10E3/uL   EOS (ABSOLUTE) 0.6 (H) 0.0 - 0.4 x10E3/uL   Basophils Absolute 0.0 0.0 - 0.2 x10E3/uL   Immature Granulocytes 0 Not Estab. %   Immature Grans (Abs) 0.0 0.0 - 0.1 x10E3/uL    Hypertension ROS: Review of Systems  Constitutional: Negative.  Negative for fever.       Left breast pain and swelling.   HENT: Negative.   Eyes: Negative.   Respiratory: Negative.   Cardiovascular: Negative.   Gastrointestinal: Negative.   Genitourinary: Negative.   Musculoskeletal: Negative.   Skin: Negative.   Neurological: Negative.   Psychiatric/Behavioral: Negative.      OBJECTIVE:   BP (!) 135/92 (BP Location: Right Arm, Patient Position: Sitting, Cuff Size: Normal)   Pulse 98  Temp 99.4 F (37.4 C) (Oral)   Resp 14   Ht 5\' 4"  (1.626 m)   Wt 156 lb (70.8 kg)   SpO2 99%   BMI 26.78 kg/m   Physical Exam  Pulmonary/Chest: Left breast exhibits tenderness.    Lymphadenopathy:    She has axillary adenopathy.       Left axillary: Lateral adenopathy present.       Left: Supraclavicular adenopathy present.     ASSESSMENT/PLAN:   1. Essential hypertension - triamterene-hydrochlorothiazide (MAXZIDE-25) 37.5-25 MG tablet; Take 1 tablet by mouth daily.  Dispense: 30 tablet; Refill: 11 - losartan (COZAAR) 50 MG tablet; Take 1 tablet (50 mg total) by mouth daily.  Dispense: 30 tablet; Refill: 11 - Comprehensive metabolic panel - Lipid Panel - CBC with Differential  2.  Hyperlipidemia, unspecified hyperlipidemia type - pravastatin (PRAVACHOL) 20 MG tablet; Take 1 tablet (20 mg total) by mouth daily.  Dispense: 90 tablet; Refill: 1 - Lipid Panel  3. Dysthymic disorder - PARoxetine (PAXIL) 40 MG tablet; Take 1 tablet (40 mg total) by mouth every morning.  Dispense: 30 tablet; Refill: 5  4. Esophageal reflux - omeprazole (PRILOSEC) 20 MG capsule; Take 1 capsule (20 mg total) by mouth daily.  Dispense: 30 capsule; Refill: 11  5. Breast pain, left - MM Digital Diagnostic Unilat L; Future - US BREAST COMPLETE UNI LEFT INC AXILLA; Future  6. Family history of carcinoma in situ of breast - MM Digital Diagnostic Unilat L; Future - US BREAST COMPLETE UNI LEFT INC AXILLA; Future   The patient is asked to make an attempt to improve diet and exercise patterns to aid in medical management of this problem.  Return to care as scheduled and prn. Patient verbalized understanding and agreed with plan of care.    Ms. Doug Sou. Nathaneil Canary, FNP-BC [facility] [address] [place], [place] [address] [phone]

## 2017-09-05 LAB — COMPREHENSIVE METABOLIC PANEL
ALT: 24 IU/L (ref 0–32)
AST: 25 IU/L (ref 0–40)
Albumin/Globulin Ratio: 2.1 (ref 1.2–2.2)
Albumin: 4.6 g/dL (ref 3.6–4.8)
Alkaline Phosphatase: 63 IU/L (ref 39–117)
BUN/Creatinine Ratio: 12 (ref 12–28)
BUN: 8 mg/dL (ref 8–27)
Bilirubin Total: 0.5 mg/dL (ref 0.0–1.2)
CO2: 26 mmol/L (ref 20–29)
Calcium: 9.5 mg/dL (ref 8.7–10.3)
Chloride: 96 mmol/L (ref 96–106)
Creatinine, Ser: 0.67 mg/dL (ref 0.57–1.00)
GFR calc Af Amer: 109 mL/min/{1.73_m2} (ref >59)
GFR calc non Af Amer: 95 mL/min/{1.73_m2} (ref >59)
Globulin, Total: 2.2 g/dL (ref 1.5–4.5)
Glucose: 112 mg/dL — ABNORMAL HIGH (ref 65–99)
Potassium: 3.8 mmol/L (ref 3.5–5.2)
Sodium: 138 mmol/L (ref 134–144)
Total Protein: 6.8 g/dL (ref 6.0–8.5)

## 2017-09-05 LAB — CBC WITH DIFFERENTIAL/PLATELET
Basophils Absolute: 0 10*3/uL (ref 0.0–0.2)
Basos: 1 %
EOS (ABSOLUTE): 0.6 10*3/uL — ABNORMAL HIGH (ref 0.0–0.4)
Eos: 7 %
Hematocrit: 44.6 % (ref 34.0–46.6)
Hemoglobin: 15.4 g/dL (ref 11.1–15.9)
Immature Grans (Abs): 0 10*3/uL (ref 0.0–0.1)
Immature Granulocytes: 0 %
Lymphocytes Absolute: 1.7 10*3/uL (ref 0.7–3.1)
Lymphs: 20 %
MCH: 32 pg (ref 26.6–33.0)
MCHC: 34.5 g/dL (ref 31.5–35.7)
MCV: 93 fL (ref 79–97)
Monocytes Absolute: 0.6 10*3/uL (ref 0.1–0.9)
Monocytes: 7 %
Neutrophils Absolute: 5.6 10*3/uL (ref 1.4–7.0)
Neutrophils: 65 %
Platelets: 312 10*3/uL (ref 150–450)
RBC: 4.81 x10E6/uL (ref 3.77–5.28)
RDW: 12.3 % (ref 12.3–15.4)
WBC: 8.5 10*3/uL (ref 3.4–10.8)

## 2017-09-05 LAB — LIPID PANEL
Chol/HDL Ratio: 1.9 ratio (ref 0.0–4.4)
Cholesterol, Total: 206 mg/dL — ABNORMAL HIGH (ref 100–199)
HDL: 107 mg/dL (ref >39)
LDL Calculated: 87 mg/dL (ref 0–99)
Triglycerides: 60 mg/dL (ref 0–149)
VLDL Cholesterol Cal: 12 mg/dL (ref 5–40)

## 2017-09-15 ENCOUNTER — Telehealth (HOSPITAL_COMMUNITY): Payer: Self-pay | Admitting: *Deleted

## 2017-09-15 NOTE — Telephone Encounter (Signed)
Telephoned patient at home number and unable to leave message due to mailbox being full.  

## 2017-09-19 ENCOUNTER — Telehealth (HOSPITAL_COMMUNITY): Payer: Self-pay

## 2017-09-19 NOTE — Telephone Encounter (Signed)
TRIED TO CALL MAILBOX WAS FULL

## 2017-09-29 ENCOUNTER — Telehealth (HOSPITAL_COMMUNITY): Payer: Self-pay | Admitting: *Deleted

## 2017-09-29 NOTE — Telephone Encounter (Signed)
Telephoned patient at home number and mailbox is full

## 2017-10-08 MED FILL — LOSARTAN POTASSIUM 50 MG TA: 50 | 30 days supply | Qty: 30 | Fill #1

## 2017-10-08 MED FILL — OMEPRAZOLE 20 MG CAP: 20 | 30 days supply | Qty: 30 | Fill #1

## 2017-10-08 MED FILL — TRIAMTERENE/HCTZ 37.5/25 TB: 37.5-25 | 30 days supply | Qty: 30 | Fill #1

## 2017-10-09 MED FILL — PRAVASTATIN SODIUM 20 MG TA: 20 | 30 days supply | Qty: 30 | Fill #4

## 2017-10-21 ENCOUNTER — Other Ambulatory Visit: Payer: Self-pay

## 2017-10-21 DIAGNOSIS — F341 Dysthymic disorder: Secondary | ICD-10-CM

## 2017-10-21 MED ORDER — PAROXETINE HCL 40 MG PO TABS: 40.0000 mg | ORAL_TABLET | ORAL | 5 refills | Status: DC

## 2017-10-21 MED FILL — PARoxetine HCL 40 MG TABS: 40 | 30 days supply | Qty: 30 | Fill #0

## 2017-11-18 MED FILL — PRAVASTATIN SODIUM 20 MG TA: 20 | 30 days supply | Qty: 30 | Fill #5

## 2017-11-18 MED FILL — TRIAMTERENE/HCTZ 37.5/25 TB: 37.5-25 | 30 days supply | Qty: 30 | Fill #2

## 2017-11-18 MED FILL — LOSARTAN POTASSIUM 50 MG TA: 50 | 30 days supply | Qty: 30 | Fill #2

## 2017-11-18 MED FILL — PARoxetine HCL 40 MG TABS: 40 | 30 days supply | Qty: 30 | Fill #1

## 2017-11-18 MED FILL — OMEPRAZOLE 20 MG CAP: 20 | 30 days supply | Qty: 30 | Fill #2

## 2017-12-23 ENCOUNTER — Other Ambulatory Visit: Payer: Self-pay | Admitting: Family Medicine

## 2017-12-23 DIAGNOSIS — E785 Hyperlipidemia, unspecified: Secondary | ICD-10-CM

## 2017-12-23 MED FILL — LOSARTAN POTASSIUM 50 MG TA: 50 | 30 days supply | Qty: 30 | Fill #3

## 2017-12-23 MED FILL — PARoxetine HCL 40 MG TABS: 40 | 30 days supply | Qty: 30 | Fill #2

## 2017-12-23 MED FILL — OMEPRAZOLE 20 MG CAP: 20 | 30 days supply | Qty: 30 | Fill #3

## 2017-12-23 MED FILL — PRAVASTATIN SODIUM 20 MG TA: 20 | 30 days supply | Qty: 30 | Fill #0

## 2017-12-23 MED FILL — TRIAMTERENE/HCTZ 37.5/25 TB: 37.5-25 | 30 days supply | Qty: 30 | Fill #3

## 2018-01-28 ENCOUNTER — Encounter: Payer: Self-pay | Admitting: Family Medicine

## 2018-01-28 ENCOUNTER — Ambulatory Visit (INDEPENDENT_AMBULATORY_CARE_PROVIDER_SITE_OTHER): Payer: Self-pay | Admitting: Family Medicine

## 2018-01-28 VITALS — BP 131/84 | HR 85 | Temp 98.1°F | Resp 16 | Ht 64.0 in | Wt 161.0 lb

## 2018-01-28 DIAGNOSIS — M79641 Pain in right hand: Secondary | ICD-10-CM

## 2018-01-28 DIAGNOSIS — G629 Polyneuropathy, unspecified: Secondary | ICD-10-CM

## 2018-01-28 DIAGNOSIS — M79642 Pain in left hand: Secondary | ICD-10-CM

## 2018-01-28 LAB — POCT GLYCOSYLATED HEMOGLOBIN (HGB A1C): Hemoglobin A1C: 5.2 % (ref 4.0–5.6)

## 2018-01-28 MED ORDER — MELOXICAM 15 MG PO TABS: 15.0000 mg | ORAL_TABLET | Freq: Every day | ORAL | 0 refills | Status: DC

## 2018-01-28 MED FILL — ?OMEPRAZOLE 20 MG CAPSULE D: 20 | 30 days supply | Qty: 30 | Fill #4

## 2018-01-28 MED FILL — ?TRIAMTERENE/HCTZ 37.5/25TB: 37.5-25 | 30 days supply | Qty: 30 | Fill #4

## 2018-01-28 MED FILL — PRAVASTATIN SODIUM 20 MG TA: 20 | 30 days supply | Qty: 30 | Fill #1

## 2018-01-28 MED FILL — PARoxetine HCL 40 MG TABS: 40 | 30 days supply | Qty: 30 | Fill #3

## 2018-01-28 MED FILL — MELOXICAM 15 MG TABLET: 15 | 30 days supply | Qty: 30 | Fill #0

## 2018-01-28 MED FILL — LOSARTAN POTASSIUM 50 MG TA: 50 | 30 days supply | Qty: 30 | Fill #4

## 2018-01-28 NOTE — Patient Instructions (Signed)
Meloxicam tablets What is this medicine? MELOXICAM (mel OX i cam) is a non-steroidal anti-inflammatory drug (NSAID). It is used to reduce swelling and to treat pain. It may be used for osteoarthritis, rheumatoid arthritis, or juvenile rheumatoid arthritis. This medicine may be used for other purposes; ask your health care provider or pharmacist if you have questions. COMMON BRAND NAME(S): Mobic What should I tell my health care provider before I take this medicine? They need to know if you have any of these conditions: -bleeding disorders -cigarette smoker -coronary artery bypass graft (CABG) surgery within the past 2 weeks -drink more than 3 alcohol-containing drinks per day -heart disease -high blood pressure -history of stomach bleeding -kidney disease -liver disease -lung or breathing disease, like asthma -stomach or intestine problems -an unusual or allergic reaction to meloxicam, aspirin, other NSAIDs, other medicines, foods, dyes, or preservatives -pregnant or trying to get pregnant -breast-feeding How should I use this medicine? Take this medicine by mouth with a full glass of water. Follow the directions on the prescription label. You can take it with or without food. If it upsets your stomach, take it with food. Take your medicine at regular intervals. Do not take it more often than directed. Do not stop taking except on your doctor's advice. A special MedGuide will be given to you by the pharmacist with each prescription and refill. Be sure to read this information carefully each time. Talk to your pediatrician regarding the use of this medicine in children. While this drug may be prescribed for selected conditions, precautions do apply. Patients over 65 years old may have a stronger reaction and need a smaller dose. Overdosage: If you think you have taken too much of this medicine contact a poison control center or emergency room at once. NOTE: This medicine is only for you. Do  not share this medicine with others. What if I miss a dose? If you miss a dose, take it as soon as you can. If it is almost time for your next dose, take only that dose. Do not take double or extra doses. What may interact with this medicine? Do not take this medicine with any of the following medications: -cidofovir -ketorolac This medicine may also interact with the following medications: -aspirin and aspirin-like medicines -certain medicines for blood pressure, heart disease, irregular heart beat -certain medicines for depression, anxiety, or psychotic disturbances -certain medicines that treat or prevent blood clots like warfarin, enoxaparin, dalteparin, apixaban, dabigatran, rivaroxaban -cyclosporine -diuretics -methotrexate -other NSAIDs, medicines for pain and inflammation, like ibuprofen and naproxen -pemetrexed This list may not describe all possible interactions. Give your health care provider a list of all the medicines, herbs, non-prescription drugs, or dietary supplements you use. Also tell them if you smoke, drink alcohol, or use illegal drugs. Some items may interact with your medicine. What should I watch for while using this medicine? Tell your doctor or healthcare professional if your symptoms do not start to get better or if they get worse. Do not take other medicines that contain aspirin, ibuprofen, or naproxen with this medicine. Side effects such as stomach upset, nausea, or ulcers may be more likely to occur. Many medicines available without a prescription should not be taken with this medicine. This medicine can cause ulcers and bleeding in the stomach and intestines at any time during treatment. This can happen with no warning and may cause death. There is increased risk with taking this medicine for a long time. Smoking, drinking alcohol, older age, and   poor health can also increase risks. Call your doctor right away if you have stomach pain or blood in your vomit or  stool. This medicine does not prevent heart attack or stroke. In fact, this medicine may increase the chance of a heart attack or stroke. The chance may increase with longer use of this medicine and in people who have heart disease. If you take aspirin to prevent heart attack or stroke, talk with your doctor or health care professional. What side effects may I notice from receiving this medicine? Side effects that you should report to your doctor or health care professional as soon as possible: -allergic reactions like skin rash, itching or hives, swelling of the face, lips, or tongue -nausea, vomiting -signs and symptoms of a blood clot such as breathing problems; changes in vision; chest pain; severe, sudden headache; pain, swelling, warmth in the leg; trouble speaking; sudden numbness or weakness of the face, arm, or leg -signs and symptoms of bleeding such as bloody or black, tarry stools; red or dark-brown urine; spitting up blood or brown material that looks like coffee grounds; red spots on the skin; unusual bruising or bleeding from the eye, gums, or nose -signs and symptoms of liver injury like dark yellow or brown urine; general ill feeling or flu-like symptoms; light-colored stools; loss of appetite; nausea; right upper belly pain; unusually weak or tired; yellowing of the eyes or skin -signs and symptoms of stroke like changes in vision; confusion; trouble speaking or understanding; severe headaches; sudden numbness or weakness of the face, arm, or leg; trouble walking; dizziness; loss of balance or coordination Side effects that usually do not require medical attention (report to your doctor or health care professional if they continue or are bothersome): -constipation -diarrhea -gas This list may not describe all possible side effects. Call your doctor for medical advice about side effects. You may report side effects to FDA at 1-800-FDA-1088. Where should I keep my medicine? Keep out  of the reach of children. Store at room temperature between 15 and 30 degrees C (59 and 86 degrees F). Throw away any unused medicine after the expiration date. NOTE: This sheet is a summary. It may not cover all possible information. If you have questions about this medicine, talk to your doctor, pharmacist, or health care provider.  2019 Elsevier/Gold Standard (2017-04-25 11:22:56) Arthritis Arthritis means joint pain. It can also mean joint disease. A joint is a place where bones come together. People who have arthritis may have:  Red joints.  Swollen joints.  Stiff joints.  Warm joints.  A fever.  A feeling of being sick. Follow these instructions at home: Pay attention to any changes in your symptoms. Take these actions to help with your pain and swelling. Medicines  Take over-the-counter and prescription medicines only as told by your doctor.  Do not take aspirin for pain if your doctor says that you may have gout. Activity  Rest your joint if your doctor tells you to.  Avoid activities that make the pain worse.  Exercise your joint regularly as told by your doctor. Try doing exercises like: ? Swimming. ? Water aerobics. ? Biking. ? Walking. Joint Care   If your joint is swollen, keep it raised (elevated) if told by your doctor.  If your joint feels stiff in the morning, try taking a warm shower.  If you have diabetes, do not apply heat without asking your doctor.  If told, apply heat to the joint: ? Put a  towel between the joint and the hot pack or heating pad. ? Leave the heat on the area for 20-30 minutes.  If told, apply ice to the joint: ? Put ice in a plastic bag. ? Place a towel between your skin and the bag. ? Leave the ice on for 20 minutes, 2-3 times per day.  Keep all follow-up visits as told by your doctor. Contact a doctor if:  The pain gets worse.  You have a fever. Get help right away if:  You have very bad pain in your joint.  You  have swelling in your joint.  Your joint is red.  Many joints become painful and swollen.  You have very bad back pain.  Your leg is very weak.  You cannot control your pee (urine) or poop (stool). This information is not intended to replace advice given to you by your health care provider. Make sure you discuss any questions you have with your health care provider. Document Released: 03/20/2009 Document Revised: 06/01/2015 Document Reviewed: 03/21/2014 Elsevier Interactive Patient Education  2019 [employer].

## 2018-01-28 NOTE — Progress Notes (Signed)
Patient [facility]   Progress Note: General Provider: Lanae Boast, FNP  SUBJECTIVE:   Catherine Gilmore is a 63 y.o. female who  has a past medical history of Anxiety, Back pain, chronic, Flushing, GERD (gastroesophageal reflux disease), History of gestational diabetes, Hyperlipidemia, Hypertension, Hypokalemia, Mitral valve prolapse, Polycythemia, and Supraventricular tachycardia, paroxysmal ([place]) (01/1998,02/1998).. Patient presents today for Hand Pain (tingling in fingers on right hand x 1 week )  Patient presents with pain swelling of bilateral hands with the right worse than the left.  Patient states that she sleeps on the right arm and when she wakens in the morning she notices stiffness to the elbow on the right side.  Patient states that she noticed tingling this week with mild pain.  Has not taken any medication for this.  Denies weakness or loss of being able to grip.  Denies family history of autoimmune disorders.  Review of Systems  Constitutional: Negative.   HENT: Negative.   Eyes: Negative.   Respiratory: Negative.   Cardiovascular: Negative.   Gastrointestinal: Negative.   Genitourinary: Negative.   Musculoskeletal: Positive for joint pain (right hand ).  Skin: Negative.   Neurological: Positive for tingling (right hand to elbow). Negative for weakness.  Psychiatric/Behavioral: Negative.      OBJECTIVE: BP 131/84 (BP Location: Right Arm, Patient Position: Sitting, Cuff Size: Normal)   Pulse 85   Temp 98.1 F (36.7 C) (Oral)   Resp 16   Ht 5\' 4"  (1.626 m)   Wt 161 lb (73 kg)   SpO2 99%   BMI 27.64 kg/m   Wt Readings from Last 3 Encounters:  01/28/18 161 lb (73 kg)  09/04/17 156 lb (70.8 kg)  03/03/17 154 lb (69.9 kg)     Physical Exam Vitals signs and nursing note reviewed.  Constitutional:      General: She is not in acute distress.    Appearance: She is well-developed.  HENT:     Head: Normocephalic and  atraumatic.  Eyes:     Conjunctiva/sclera: Conjunctivae normal.     Pupils: Pupils are equal, round, and reactive to light.  Neck:     Musculoskeletal: Normal range of motion.  Cardiovascular:     Rate and Rhythm: Normal rate and regular rhythm.     Heart sounds: Normal heart sounds.  Pulmonary:     Effort: Pulmonary effort is normal. No respiratory distress.     Breath sounds: Normal breath sounds.  Abdominal:     General: Bowel sounds are normal. There is no distension.     Palpations: Abdomen is soft.  Musculoskeletal: Normal range of motion.        General: Swelling (right hand 3rd digit with swelling noted to the PIP and MIP joints. no TTP) present.  Skin:    General: Skin is warm and dry.  Neurological:     Mental Status: She is alert and oriented to person, place, and time.  Psychiatric:        Behavior: Behavior normal.        Thought Content: Thought content normal.     ASSESSMENT/PLAN:  1. Neuropathy A1c 5.2. no diabetes to cause neuropathy.  - HgB A1c - meloxicam (MOBIC) 15 MG tablet; Take 1 tablet (15 mg total) by mouth daily.  Dispense: 30 tablet; Refill: 0 - Rheumatoid Arthritis Profile  2. Bilateral hand pain - meloxicam (MOBIC) 15 MG tablet; Take 1 tablet (15 mg total) by mouth daily.  Dispense: 30 tablet;  Refill: 0 - Rheumatoid Arthritis Profile     Return if symptoms worsen or fail to improve, for 03/09/2018 already scheduled.    The patient was given clear instructions to go to ER or return to medical center if symptoms do not improve, worsen or new problems develop. The patient verbalized understanding and agreed with plan of care.   Ms. Doug Sou. Nathaneil Canary, FNP-BC [facility] [address] [place], [place] [address] [phone]

## 2018-01-30 LAB — RHEUMATOID ARTHRITIS PROFILE
Cyclic Citrullin Peptide Ab: 145 units — ABNORMAL HIGH (ref 0–19)
Rheumatoid fact SerPl-aCnc: <10 IU/mL (ref 0.0–13.9)

## 2018-02-04 NOTE — Addendum Note (Signed)
Addended by: Genelle Bal on: 02/04/2018 05:00 PM   Modules accepted: Orders

## 2018-02-06 ENCOUNTER — Telehealth: Payer: Self-pay

## 2018-02-06 NOTE — Telephone Encounter (Signed)
-----   Message from Lanae Boast, [place] sent at 02/06/2018  8:12 AM EST ----- Please let patient know that I need to have one more test for her to rule out autoimmune disorders. She can come for the lab at any time. I will put the order in.

## 2018-02-06 NOTE — Telephone Encounter (Signed)
Called, no answer and voicemail was full.

## 2018-02-09 NOTE — Telephone Encounter (Signed)
Called and spoke with patient. Advised that we needed her to come in for a lab appointment for another test to rule out autoimmune disorders. She states she will check her schedule and call back today to schedule an appointment. Thanks !

## 2018-02-20 ENCOUNTER — Encounter: Payer: Self-pay | Admitting: Family Medicine

## 2018-02-20 ENCOUNTER — Ambulatory Visit (INDEPENDENT_AMBULATORY_CARE_PROVIDER_SITE_OTHER): Payer: Self-pay | Admitting: Family Medicine

## 2018-02-20 VITALS — BP 133/76 | HR 87 | Temp 97.8°F | Resp 14 | Ht 64.0 in | Wt 159.0 lb

## 2018-02-20 DIAGNOSIS — M79642 Pain in left hand: Secondary | ICD-10-CM

## 2018-02-20 DIAGNOSIS — M79641 Pain in right hand: Secondary | ICD-10-CM

## 2018-02-20 DIAGNOSIS — G629 Polyneuropathy, unspecified: Secondary | ICD-10-CM

## 2018-02-20 NOTE — Patient Instructions (Signed)
Vitamin D Deficiency Vitamin D deficiency is when your body does not have enough vitamin D. Vitamin D is important because:  It helps your body use other minerals that your body needs.  It helps keep your bones strong and healthy.  It may help to prevent some diseases.  It helps your heart and other muscles work well. You can get vitamin D by:  Eating foods with vitamin D in them.  Drinking or eating milk or other foods that have had vitamin D added to them.  Taking a vitamin D supplement.  Being in the sun. Not getting enough vitamin D can make your bones become soft. It can also cause other health problems. Follow these instructions at home:  Take medicines and supplements only as told by your doctor.  Eat foods that have vitamin D. These include: ? Dairy products, cereals, or juices with added vitamin D. Check the label for vitamin D. ? Fatty fish like salmon or trout. ? Eggs. ? Oysters.  Do not use tanning beds.  Stay at a healthy weight. Lose weight, if needed.  Keep all follow-up visits as told by your doctor. This is important. Contact a doctor if:  Your symptoms do not go away.  You feel sick to your stomach (nauseous).  Youthrow up (vomit).  You poop less often than usual or you have trouble pooping (constipation). This information is not intended to replace advice given to you by your health care provider. Make sure you discuss any questions you have with your health care provider. Document Released: 12/13/2010 Document Revised: 06/01/2015 Document Reviewed: 05/11/2014 Elsevier Interactive Patient Education  2019 [place] [facility] Many things can cause hand pain. Some common causes are:  An injury.  Repeating the same movement with your hand over and over (overuse).  Osteoporosis.  Arthritis.  Lumps in the tendons or joints of the hand and wrist (ganglion cysts).  Infection. Follow these instructions at home: Pay attention to any changes  in your symptoms. Take these actions to help with your discomfort:  If directed, put ice on the affected area: ? Put ice in a plastic bag. ? Place a towel between your skin and the bag. ? Leave the ice on for 15-20 minutes, 3?4 times a day for 2 days.  Take over-the-counter and prescription medicines only as told by your health care provider.  Minimize stress on your hands and wrists as much as possible.  Take breaks from repetitive activity often.  Do stretches as told by your health care provider.  Do not do activities that make your pain worse. Contact a health care provider if:  Your pain does not get better after a few days of self-care.  Your pain gets worse.  Your pain affects your ability to do your daily activities. Get help right away if:  Your hand becomes warm, red, or swollen.  Your hand is numb or tingling.  Your hand is extremely swollen or deformed.  Your hand or fingers turn white or blue.  You cannot move your hand, wrist, or fingers. This information is not intended to replace advice given to you by your health care provider. Make sure you discuss any questions you have with your health care provider. Document Released: 01/20/2015 Document Revised: 06/01/2015 Document Reviewed: 01/19/2014 Elsevier Interactive Patient Education  2019 [employer] Hand Exercises Hand exercises can be helpful to almost anyone. These exercises can strengthen the hands, improve flexibility and movement, and increase blood flow to the hands. These  results can make work and daily tasks easier. Hand exercises can be especially helpful for people who have joint pain from arthritis or have nerve damage from overuse (carpal tunnel syndrome). These exercises can also help people who have injured a hand. Most of these hand exercises are fairly gentle stretching routines. You can do them often throughout the day. Still, it is a good idea to ask your health care provider which exercises  would be best for you. Warming your hands before exercise may help to reduce stiffness. You can do this with gentle massage or by placing your hands in warm water for 15 minutes. Also, make sure you pay attention to your level of hand pain as you begin an exercise routine. Exercises Knuckle bend Repeat this exercise 5-10 times with each hand. 1. Stand or sit with your arm, hand, and all five fingers pointed straight up. Make sure your wrist is straight. 2. Gently and slowly bend your fingers down and inward until the tips of your fingers are touching the tops of your palm. 3. Hold this position for a few seconds. 4. Extend your fingers out to their original position, all pointing straight up again. Finger fan Repeat this exercise 5-10 times with each hand. 1. Hold your arm and hand out in front of you. Keep your wrist straight. 2. Squeeze your hand into a fist. 3. Hold this position for a few seconds. 4. Edison Simon out, or spread apart, your hand and fingers as much as possible, stretching every joint fully. Tabletop Repeat this exercise 5-10 times with each hand. 1. Stand or sit with your arm, hand, and all five fingers pointed straight up. Make sure your wrist is straight. 2. Gently and slowly bend your fingers at the knuckles where they meet the hand until your hand is making an upside-down L shape. Your fingers should form a tabletop. 3. Hold this position for a few seconds. 4. Extend your fingers out to their original position, all pointing straight up again. Making Os Repeat this exercise 5-10 times with each hand. 1. Stand or sit with your arm, hand, and all five fingers pointed straight up. Make sure your wrist is straight. 2. Make an O shape by touching your pointer finger to your thumb. Hold for a few seconds. Then open your hand wide. 3. Repeat this motion with each finger on your hand. Table spread Repeat this exercise 5-10 times with each hand. 1. Place your hand on a table with your  palm facing down. Make sure your wrist is straight. 2. Spread your fingers out as much as possible. Hold this position for a few seconds. 3. Slide your fingers back together again. Hold for a few seconds. Ball grip Repeat this exercise 10-15 times with each hand. 1. Hold a tennis ball or another soft ball in your hand. 2. While slowly increasing pressure, squeeze the ball as hard as possible. 3. Squeeze as hard as you can for 3-5 seconds. 4. Relax and repeat.  Wrist curls Repeat this exercise 10-15 times with each hand. 1. Sit in a chair that has armrests. 2. Hold a light weight in your hand, such as a dumbbell that weighs 1-3 pounds (0.5-1.4 kg). Ask your health care provider what weight would be best for you. 3. Rest your hand just over the end of the chair arm with your palm facing up. 4. Gently pivot your wrist up and down while holding the weight. Do not twist your wrist from side to side. Contact  a health care provider if:  Your hand pain or discomfort gets much worse when you do an exercise.  Your hand pain or discomfort does not improve within 2 hours after you exercise. If you have any of these problems, stop doing these exercises right away. Do not do them again unless your health care provider says that you can. Get help right away if:  You develop sudden, severe hand pain. If this happens, stop doing these exercises right away. Do not do them again unless your health care provider says that you can. This information is not intended to replace advice given to you by your health care provider. Make sure you discuss any questions you have with your health care provider. Document Released: 12/05/2014 Document Revised: 04/29/2017 Document Reviewed: 07/04/2014 Elsevier Interactive Patient Education  2019 [employer].

## 2018-02-20 NOTE — Progress Notes (Signed)
  [facility]   Progress Note: Sick Visit Provider: Lanae Boast, FNP  SUBJECTIVE:   Analy Gilmore is a 63 y.o. female who  has a past medical history of Anxiety, Back pain, chronic, Flushing, GERD (gastroesophageal reflux disease), History of gestational diabetes, Hyperlipidemia, Hypertension, Hypokalemia, Mitral valve prolapse, Polycythemia, and Supraventricular tachycardia, paroxysmal ([place]) (01/1998,02/1998).. Patient presents today for Hand Pain (right hand tingling non stop. )  Patient states that hand pain has become increasingly worse over the past few weeks. She states that the tingling has been constant. Denies injury to the area. She reports being right hand dominant and has worked with her hands the majority of her life.  ROS   OBJECTIVE: BP 133/76 (BP Location: Right Arm, Patient Position: Sitting, Cuff Size: Normal)   Pulse 87   Temp 97.8 F (36.6 C) (Oral)   Resp 14   Ht 5\' 4"  (1.626 m)   Wt 159 lb (72.1 kg)   SpO2 99%   BMI 27.29 kg/m   Wt Readings from Last 3 Encounters:  02/20/18 159 lb (72.1 kg)  01/28/18 161 lb (73 kg)  09/04/17 156 lb (70.8 kg)     Physical Exam Vitals signs and nursing note reviewed.  Constitutional:      General: She is not in acute distress.    Appearance: She is well-developed.  HENT:     Head: Normocephalic and atraumatic.  Eyes:     Conjunctiva/sclera: Conjunctivae normal.     Pupils: Pupils are equal, round, and reactive to light.  Neck:     Musculoskeletal: Normal range of motion.  Cardiovascular:     Rate and Rhythm: Normal rate and regular rhythm.     Heart sounds: Normal heart sounds.  Pulmonary:     Effort: Pulmonary effort is normal. No respiratory distress.     Breath sounds: Normal breath sounds.  Abdominal:     General: Bowel sounds are normal. There is no distension.     Palpations: Abdomen is soft.  Musculoskeletal: Normal range of motion.        General: No  swelling.     Comments: Musculoskeletal: Normal range of motion.        General: Swelling (right hand 3rd digit with swelling noted to the PIP and MIP joints. no TTP) present.   Skin:    General: Skin is warm and dry.  Neurological:     Mental Status: She is alert and oriented to person, place, and time.  Psychiatric:        Mood and Affect: Mood normal.        Behavior: Behavior normal.        Thought Content: Thought content normal.        Judgment: Judgment normal.     ASSESSMENT/PLAN:   1. Bilateral hand pain - ANA,IFA RA Diag Pnl w/rflx Tit/Patn  2. Neuropathy - Vitamin D, 25-hydroxy - Vitamin B12         The patient was given clear instructions to go to ER or return to medical center if symptoms do not improve, worsen or new problems develop. The patient verbalized understanding and agreed with plan of care.   Ms. Doug Sou. Nathaneil Canary, FNP-BC [facility] [address] [place], [place] [address] [phone]     This note has been created with Dragon speech recognition software and smart phrase technology. Any transcriptional errors are unintentional.

## 2018-02-24 LAB — ANA,IFA RA DIAG PNL W/RFLX TIT/PATN
ANA Titer 1: NEGATIVE
Cyclic Citrullin Peptide Ab: 147 units — ABNORMAL HIGH (ref 0–19)
Rheumatoid fact SerPl-aCnc: 10.2 IU/mL (ref 0.0–13.9)

## 2018-02-24 LAB — VITAMIN B12: Vitamin B-12: 459 pg/mL (ref 232–1245)

## 2018-02-24 LAB — VITAMIN D 25 HYDROXY (VIT D DEFICIENCY, FRACTURES): Vit D, 25-Hydroxy: 21.1 ng/mL — ABNORMAL LOW (ref 30.0–100.0)

## 2018-02-26 ENCOUNTER — Other Ambulatory Visit: Payer: Self-pay | Admitting: Family Medicine

## 2018-02-26 ENCOUNTER — Telehealth: Payer: Self-pay

## 2018-02-26 MED ORDER — VITAMIN D (ERGOCALCIFEROL) 1.25 MG (50000 UNIT) PO CAPS: 50000.0000 [IU] | ORAL_CAPSULE | ORAL | 0 refills | Status: AC

## 2018-02-26 MED FILL — VIT D2 1.25 MG (50,000 UNIT: 1.25 MG | 4 days supply | Qty: 4 | Fill #0

## 2018-02-26 NOTE — Telephone Encounter (Signed)
Called, no answer and no voicemail. Will try later Thanks!

## 2018-02-26 NOTE — Progress Notes (Signed)
v

## 2018-02-26 NOTE — Telephone Encounter (Signed)
Patient returned call. I advised that vitamin D was low and that we have sent in an rx for vitamin D to pharmacy to take once a week as directed until complete. After completion I advised patient to take otc vitamin D 1000 units daily. Advised that labs continue to show inflammation and that she can take Nsaids otc to help with inflammation. Patient states she does not have insurance and does not want to be sent to rheumatology at this time. Thanks!

## 2018-02-26 NOTE — Telephone Encounter (Signed)
-----   Message from Lanae Boast, [place] sent at 02/26/2018 12:26 PM EST ----- Low Vitamin D.  I will send Vit D to the pharmacy. After completion of the prescription, patient will need otc vitamin D of 1,000units daily.  Her labs continue to show inflammation. If she has orange card, we may be able to send her to rheum for further evaluation. Continue with NSAIDs as this will help with inflammation.

## 2018-03-09 ENCOUNTER — Ambulatory Visit: Payer: Self-pay | Admitting: Family Medicine

## 2018-03-16 MED FILL — ?PAROXETINE HCL 40 MG TABS: 40 | 30 days supply | Qty: 30 | Fill #4

## 2018-03-16 MED FILL — ?TRIAMTERENE/HCTZ 37.5/25TB: 37.5-25 | 30 days supply | Qty: 30 | Fill #5

## 2018-03-16 MED FILL — PRAVASTATIN SODIUM 20 MG TA: 20 | 30 days supply | Qty: 30 | Fill #2

## 2018-03-16 MED FILL — ?OMEPRAZOLE 20 MG CAPSULE D: 20 | 30 days supply | Qty: 30 | Fill #5

## 2018-03-16 MED FILL — LOSARTAN POTASSIUM 50 MG TA: 50 | 30 days supply | Qty: 30 | Fill #5

## 2018-04-20 MED FILL — ?TRIAMTERENE/HCTZ 37.5/25TB: 37.5-25 | 30 days supply | Qty: 30 | Fill #6

## 2018-04-20 MED FILL — LOSARTAN POTASSIUM 50 MG TA: 50 | 30 days supply | Qty: 30 | Fill #6

## 2018-04-20 MED FILL — ?OMEPRAZOLE 20 MG CAPSULE D: 20 | 30 days supply | Qty: 30 | Fill #6

## 2018-04-20 MED FILL — ?PAROXETINE HCL 40 MG TABS: 40 | 30 days supply | Qty: 30 | Fill #5

## 2018-04-20 MED FILL — PRAVASTATIN SODIUM 20 MG TA: 20 | 30 days supply | Qty: 30 | Fill #3

## 2018-05-11 ENCOUNTER — Encounter (HOSPITAL_COMMUNITY): Payer: Self-pay | Admitting: Emergency Medicine

## 2018-05-11 ENCOUNTER — Other Ambulatory Visit: Payer: Self-pay

## 2018-05-11 ENCOUNTER — Emergency Department (HOSPITAL_COMMUNITY)
Admission: EM | Admit: 2018-05-11 | Discharge: 2018-05-11 | Disposition: A | Discharge status: Home / Self Care | Payer: Self-pay | Attending: Emergency Medicine | Admitting: Emergency Medicine

## 2018-05-11 DIAGNOSIS — M62838 Other muscle spasm: Secondary | ICD-10-CM | POA: Insufficient documentation

## 2018-05-11 DIAGNOSIS — I10 Essential (primary) hypertension: Secondary | ICD-10-CM | POA: Insufficient documentation

## 2018-05-11 DIAGNOSIS — F419 Anxiety disorder, unspecified: Secondary | ICD-10-CM | POA: Insufficient documentation

## 2018-05-11 DIAGNOSIS — F17200 Nicotine dependence, unspecified, uncomplicated: Secondary | ICD-10-CM | POA: Insufficient documentation

## 2018-05-11 DIAGNOSIS — Z79899 Other long term (current) drug therapy: Secondary | ICD-10-CM | POA: Insufficient documentation

## 2018-05-11 DIAGNOSIS — F329 Major depressive disorder, single episode, unspecified: Secondary | ICD-10-CM | POA: Insufficient documentation

## 2018-05-11 MED ORDER — ACETAMINOPHEN 500 MG PO TABS
1000.0000 mg | ORAL_TABLET | Freq: Once | ORAL | Status: AC
  Administered 2018-05-11: 08:00:00 1000 mg via ORAL
  Filled 2018-05-11: qty 2

## 2018-05-11 MED ORDER — METHOCARBAMOL 500 MG PO TABS: 500.0000 mg | ORAL_TABLET | Freq: Three times a day (TID) | ORAL | 0 refills | Status: DC | PRN

## 2018-05-11 MED FILL — METHOCARBAMOL 500 MG TABS: 500 | 5 days supply | Qty: 15 | Fill #0

## 2018-05-11 NOTE — ED Triage Notes (Signed)
Pt in with c/o L neck and shoulder pain - started with a neck crick 6 days ago. States she fell yesterday off a porch stair and pain has been worsened since. Did not hit head, denies any LOC yesterday
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
## 2018-05-11 NOTE — Discharge Instructions (Addendum)
Your EKG did not show any new abnormalities.  We discussed that this can serve as a screening tool for heart issues, however you can be having heart issues that are not picked up by just this EKG.  We discussed chest x-ray which you declined.  Suspect that your pain is related to trapezius muscle spasm.  Have given you information on the maximum safe dosing of ibuprofen and Tylenol.  Please try to treat your pain with Tylenol.  If this is not sufficient you may add in ibuprofen, please start at the lowest dose and increase as needed for pain control.  You are being prescribed a medication which may make you sleepy. For 24 hours after one dose please do not drive, operate heavy machinery, care for a small child with out another adult present, or perform any activities that may cause harm to you or someone else if you were to fall asleep or be impaired.   Please take Ibuprofen (Advil, motrin) and Tylenol (acetaminophen) to relieve your pain.  You may take up to 600 MG (3 pills) of normal strength ibuprofen every 8 hours as needed.  In between doses of ibuprofen you make take tylenol, up to 1,000 mg (two extra strength pills).  Do not take more than 3,000 mg tylenol in a 24 hour period.  Please check all medication labels as many medications such as pain and cold medications may contain tylenol.  Do not drink alcohol while taking these medications.  Do not take other NSAID'S while taking ibuprofen (such as aleve or naproxen).  Please take ibuprofen with food to decrease stomach upset.  The best way to get rid of muscle pain is by taking NSAIDS, using heat, massage therapy, and gentle stretching/range of motion exercises.

## 2018-05-13 ENCOUNTER — Other Ambulatory Visit: Payer: Self-pay

## 2018-05-13 ENCOUNTER — Ambulatory Visit
Admission: EM | Admit: 2018-05-13 | Discharge: 2018-05-13 | Disposition: A | Discharge status: Home / Self Care | Payer: Self-pay | Attending: Family Medicine | Admitting: Family Medicine

## 2018-05-13 DIAGNOSIS — J0101 Acute recurrent maxillary sinusitis: Secondary | ICD-10-CM

## 2018-05-13 DIAGNOSIS — B9789 Other viral agents as the cause of diseases classified elsewhere: Secondary | ICD-10-CM

## 2018-05-13 DIAGNOSIS — F17200 Nicotine dependence, unspecified, uncomplicated: Secondary | ICD-10-CM

## 2018-05-13 MED ORDER — FLUTICASONE PROPIONATE 50 MCG/ACT NA SUSP: 1.0000 | Freq: Every day | NASAL | 2 refills | Status: DC

## 2018-05-13 MED ORDER — DOXYCYCLINE HYCLATE 100 MG PO CAPS: 100.0000 mg | ORAL_CAPSULE | Freq: Two times a day (BID) | ORAL | 0 refills | Status: DC

## 2018-05-13 MED ORDER — FLUCONAZOLE 150 MG PO TABS: 150.0000 mg | ORAL_TABLET | Freq: Every day | ORAL | 0 refills | Status: DC

## 2018-05-13 MED ORDER — CETIRIZINE HCL 10 MG PO TABS: 10.0000 mg | ORAL_TABLET | Freq: Every day | ORAL | 0 refills | Status: DC
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
## 2018-05-13 NOTE — ED Triage Notes (Signed)
Pt c/o sinus pressure and headache since yesterday.

## 2018-05-13 NOTE — Discharge Instructions (Signed)
We are treating you for sinus infection Flonase and zyrtec daily. Doxycycline 2 times a day for 10 days.  Diflucan in case you get a yeast infection.  Follow up as needed for continued or worsening symptoms

## 2018-05-25 MED FILL — VIT D2 1.25 MG (50,000 UNIT: 1.25 MG | 28 days supply | Qty: 4 | Fill #1

## 2018-05-25 MED FILL — PRAVASTATIN SODIUM 20 MG TA: 20 | 30 days supply | Qty: 30 | Fill #4

## 2018-05-25 MED FILL — ?PAROXETINE HCL 40 MG TABS: 40 | 30 days supply | Qty: 30 | Fill #0

## 2018-05-25 MED FILL — LOSARTAN POTASSIUM 50 MG TA: 50 | 30 days supply | Qty: 30 | Fill #7

## 2018-05-25 MED FILL — ?TRIAMTERENE/HCTZ 37.5/25TB: 37.5-25 | 30 days supply | Qty: 30 | Fill #7

## 2018-05-25 MED FILL — ?OMEPRAZOLE 20 MG CAPSULE D: 20 | 30 days supply | Qty: 30 | Fill #7

## 2018-05-28 ENCOUNTER — Ambulatory Visit: Payer: Self-pay | Admitting: Family Medicine

## 2018-06-04 ENCOUNTER — Telehealth: Payer: Self-pay

## 2018-06-04 NOTE — Telephone Encounter (Signed)
Called to do COVID -19 Screening. Patient preferred to have visit via telephone. Thanks!

## 2018-06-05 ENCOUNTER — Encounter: Payer: Self-pay | Admitting: Family Medicine

## 2018-06-05 ENCOUNTER — Ambulatory Visit (INDEPENDENT_AMBULATORY_CARE_PROVIDER_SITE_OTHER): Payer: Self-pay | Admitting: Family Medicine

## 2018-06-05 ENCOUNTER — Telehealth: Payer: Self-pay | Admitting: Family Medicine

## 2018-06-05 ENCOUNTER — Other Ambulatory Visit: Payer: Self-pay

## 2018-06-05 DIAGNOSIS — F341 Dysthymic disorder: Secondary | ICD-10-CM

## 2018-06-05 DIAGNOSIS — K219 Gastro-esophageal reflux disease without esophagitis: Secondary | ICD-10-CM

## 2018-06-05 DIAGNOSIS — R51 Headache: Secondary | ICD-10-CM

## 2018-06-05 DIAGNOSIS — E785 Hyperlipidemia, unspecified: Secondary | ICD-10-CM

## 2018-06-05 DIAGNOSIS — I10 Essential (primary) hypertension: Secondary | ICD-10-CM

## 2018-06-05 DIAGNOSIS — R519 Headache, unspecified: Secondary | ICD-10-CM

## 2018-06-05 MED ORDER — CETIRIZINE HCL 10 MG PO TABS: 10.0000 mg | ORAL_TABLET | Freq: Every day | ORAL | 2 refills | Status: DC

## 2018-06-05 MED ORDER — LOSARTAN POTASSIUM 50 MG PO TABS: 50.0000 mg | ORAL_TABLET | Freq: Every day | ORAL | 11 refills | Status: DC

## 2018-06-05 MED ORDER — PRAVASTATIN SODIUM 20 MG PO TABS: 20.0000 mg | ORAL_TABLET | Freq: Every day | ORAL | 1 refills | Status: DC

## 2018-06-05 MED ORDER — PAROXETINE HCL 40 MG PO TABS: 40.0000 mg | ORAL_TABLET | ORAL | 1 refills | Status: DC

## 2018-06-05 MED ORDER — FLUTICASONE PROPIONATE 50 MCG/ACT NA SUSP: 1.0000 | Freq: Every day | NASAL | 2 refills | Status: DC

## 2018-06-05 MED ORDER — OMEPRAZOLE 20 MG PO CPDR: 20.0000 mg | DELAYED_RELEASE_CAPSULE | Freq: Every day | ORAL | 2 refills | Status: DC

## 2018-06-05 MED ORDER — TRIAMTERENE-HCTZ 37.5-25 MG PO TABS: 1.0000 | ORAL_TABLET | Freq: Every day | ORAL | 3 refills | Status: DC

## 2018-06-05 NOTE — Telephone Encounter (Signed)
Called patient for virtual visit. No answer. No Voicemail.

## 2018-06-05 NOTE — Progress Notes (Signed)
  [facility]  Virtual Visit via Telephone Note  I connected with Catherine Gilmore on 06/05/18 at  8:20 AM EDT by telephone and verified that I am speaking with the correct person using two identifiers.   I discussed the limitations, risks, security and privacy concerns of performing an evaluation and management service by telephone and the availability of in person appointments. I also discussed with the patient that there may be a patient responsible charge related to this service. The patient expressed understanding and agreed to proceed.   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 Patient reports compliance with medications and denies side effects at the present time. She does not endorse home bp monitoring, DASH diet or regular exercise. She continues to smoke 1 ppd and is not interested in quitting. She denies problems or concerns today. Needs refills on her medications.    Observations/Objective: Patient with regular voice tone, rate and rhythm. Speaking calmly and is in no apparent distress.    Assessment and Plan: 1. Essential hypertension - triamterene-hydrochlorothiazide (MAXZIDE-25) 37.5-25 MG tablet; Take 1 tablet by mouth daily.  Dispense: 90 tablet; Refill: 3 - losartan (COZAAR) 50 MG tablet; Take 1 tablet (50 mg total) by mouth daily.  Dispense: 30 tablet; Refill: 11  2. Dysthymic disorder - PARoxetine (PAXIL) 40 MG tablet; Take 1 tablet (40 mg total) by mouth every morning.  Dispense: 90 tablet; Refill: 1  3. Hyperlipidemia, unspecified hyperlipidemia type - pravastatin (PRAVACHOL) 20 MG tablet; Take 1 tablet (20 mg total) by mouth daily.   Dispense: 90 tablet; Refill: 1  4. Esophageal reflux - omeprazole (PRILOSEC) 20 MG capsule; Take 1 capsule (20 mg total) by mouth daily.  Dispense: 30 capsule; Refill: 2  5. Sinus headache - cetirizine (ZYRTEC) 10 MG tablet; Take 1 tablet (10 mg total) by mouth daily.  Dispense: 30 tablet; Refill: 2 - fluticasone (FLONASE) 50 MCG/ACT nasal spray; Place 1 spray into both nostrils daily.  Dispense: 16 g; Refill: 2    Follow Up Instructions:  We discussed hand washing, using hand sanitizer when soap and water are not available, only going out when absolutely necessary, and social distancing. Explained to patient that she is immunocompromised and will need to take precautions during this time.   I discussed the assessment and treatment plan with the patient. The patient was provided an opportunity to ask questions and all were answered. The patient agreed with the plan and demonstrated an understanding of the instructions.   The patient was advised to call back or seek an in-person evaluation if the symptoms worsen or if the condition fails to improve as anticipated.  I provided 10 minutes of non-face-to-face time during this encounter.  Ms. Catherine L. Nathaneil Canary, FNP-BC [facility] [address] [place], [place] [address] [phone]

## 2018-06-25 MED FILL — LOSARTAN POTASSIUM 50 MG TA: 50 | 30 days supply | Qty: 30 | Fill #8

## 2018-06-25 MED FILL — ?PAROXETINE HCL 40 MG TABS: 40 | 30 days supply | Qty: 30 | Fill #1

## 2018-06-25 MED FILL — ?OMEPRAZOLE 20 MG CAPSULE D: 20 | 30 days supply | Qty: 30 | Fill #8

## 2018-06-25 MED FILL — ?TRIAMTERENE/HCTZ 37.5/25TB: 37.5-25 | 30 days supply | Qty: 30 | Fill #8

## 2018-06-25 MED FILL — PRAVASTATIN SODIUM 20 MG TA: 20 | 30 days supply | Qty: 30 | Fill #5

## 2018-08-04 MED FILL — TRIAMTERENE/HCTZ 37.5/25 TB: 37.5-25 | 30 days supply | Qty: 30 | Fill #9

## 2018-08-04 MED FILL — OMEPRAZOLE 20 MG CAP: 20 | 30 days supply | Qty: 30 | Fill #9

## 2018-08-04 MED FILL — PARoxetine HCL 40 MG TABS: 40 | 30 days supply | Qty: 30 | Fill #2

## 2018-08-04 MED FILL — PRAVASTATIN SODIUM 20 MG TA: 20 | 90 days supply | Qty: 90 | Fill #0

## 2018-08-04 MED FILL — LOSARTAN POTASSIUM 50 MG TA: 50 | 30 days supply | Qty: 30 | Fill #9

## 2018-09-07 ENCOUNTER — Ambulatory Visit: Payer: Self-pay | Admitting: Family Medicine

## 2018-09-08 MED FILL — OMEPRAZOLE 20 MG CAP: 20 | 30 days supply | Qty: 30 | Fill #0

## 2018-09-08 MED FILL — TRIAMTERENE/HCTZ 37.5/25 TB: 37.5-25 | 90 days supply | Qty: 90 | Fill #0

## 2018-09-08 MED FILL — LOSARTAN POTASSIUM 50 MG TA: 50 | 30 days supply | Qty: 30 | Fill #0

## 2018-09-08 MED FILL — PARoxetine HCL 40 MG TABS: 40 | 30 days supply | Qty: 30 | Fill #0

## 2018-09-10 ENCOUNTER — Ambulatory Visit (INDEPENDENT_AMBULATORY_CARE_PROVIDER_SITE_OTHER): Payer: Self-pay | Admitting: Family Medicine

## 2018-09-10 ENCOUNTER — Other Ambulatory Visit: Payer: Self-pay

## 2018-09-10 ENCOUNTER — Encounter: Payer: Self-pay | Admitting: Family Medicine

## 2018-09-10 VITALS — BP 131/76 | HR 81 | Temp 98.8°F | Resp 16 | Ht 64.0 in | Wt 155.0 lb

## 2018-09-10 DIAGNOSIS — F17209 Nicotine dependence, unspecified, with unspecified nicotine-induced disorders: Secondary | ICD-10-CM

## 2018-09-10 DIAGNOSIS — I10 Essential (primary) hypertension: Secondary | ICD-10-CM

## 2018-09-10 DIAGNOSIS — F341 Dysthymic disorder: Secondary | ICD-10-CM

## 2018-09-10 LAB — POCT URINALYSIS DIPSTICK
Bilirubin, UA: NEGATIVE
Glucose, UA: NEGATIVE
Ketones, UA: NEGATIVE
Leukocytes, UA: NEGATIVE
Nitrite, UA: NEGATIVE
Protein, UA: NEGATIVE
Spec Grav, UA: 1.02 (ref 1.010–1.025)
Urobilinogen, UA: 1 E.U./dL
pH, UA: 7 (ref 5.0–8.0)

## 2018-09-10 MED ORDER — METHOCARBAMOL 500 MG PO TABS: 500.0000 mg | ORAL_TABLET | Freq: Three times a day (TID) | ORAL | 0 refills | Status: AC | PRN

## 2018-09-10 MED ORDER — HYDROXYZINE HCL 10 MG PO TABS: 10.0000 mg | ORAL_TABLET | Freq: Three times a day (TID) | ORAL | 0 refills | Status: DC | PRN

## 2018-09-10 MED FILL — hydrOXYzine HCL 10 MG TABS: 10 | 30 days supply | Qty: 90 | Fill #0

## 2018-09-10 MED FILL — METHOCARBAMOL 500 MG TABS: 500 | 10 days supply | Qty: 30 | Fill #0

## 2018-09-10 NOTE — Progress Notes (Signed)
[name]   Progress Note: General Provider: Lanae Boast, FNP  SUBJECTIVE:   Catherine Gilmore is a 63 y.o. female who  has a past medical history of Anxiety, Back pain, chronic, Flushing, GERD (gastroesophageal reflux disease), History of gestational diabetes, Hyperlipidemia, Hypertension, Hypokalemia, Mitral valve prolapse, Polycythemia, and Supraventricular tachycardia, paroxysmal ([place]) (01/1998,02/1998).. Patient presents today for Hypertension and Anxiety (asking for ativan to help with stress and anxiety going on at home )  Patient states that she is having stress at home. Has previously been on ativan for anxiety. Currently taking Paxil 40 mg for dysthymic disorder. She currently denies depressive symptoms. She states that her son in law overdosed on narcotics. They were unaware that he was using and this has caused stress at home. She also states that she works at [employer] in a bad neighborhood and has increased anxiety due to this.  She does not follow a carb or sodium modified diet. Smokes 1 ppd and not interested in quitting.  Patient reports compliance with all medications.  Patient denies side effects of medications.    Review of Systems  Constitutional: Negative.   HENT: Negative.   Eyes: Negative.   Respiratory: Negative.   Cardiovascular: Negative.   Gastrointestinal: Negative.   Genitourinary: Negative.   Musculoskeletal: Negative.   Skin: Negative.   Neurological: Negative.   Psychiatric/Behavioral: The patient is nervous/anxious.      OBJECTIVE: BP 131/76 (BP Location: Left Arm, Patient Position: Sitting, Cuff Size: Normal)   Pulse 81   Temp 98.8 F (37.1 C) (Oral)   Resp 16   Ht 5\' 4"  (1.626 m)   Wt 155 lb (70.3 kg)   SpO2 98%   BMI 26.61 kg/m   Wt Readings from Last 3 Encounters:  09/10/18 155 lb (70.3 kg)  05/11/18 158 lb 15.2 oz (72.1 kg)  02/20/18 159 lb (72.1 kg)     Physical Exam  Vitals signs and nursing note reviewed.  Constitutional:      General: She is not in acute distress.    Appearance: Normal appearance.  HENT:     Head: Normocephalic and atraumatic.  Eyes:     Extraocular Movements: Extraocular movements intact.     Conjunctiva/sclera: Conjunctivae normal.     Pupils: Pupils are equal, round, and reactive to light.  Cardiovascular:     Rate and Rhythm: Normal rate and regular rhythm.     Heart sounds: No murmur.  Pulmonary:     Effort: Pulmonary effort is normal.     Breath sounds: Normal breath sounds.  Musculoskeletal: Normal range of motion.  Skin:    General: Skin is warm and dry.  Neurological:     Mental Status: She is alert and oriented to person, place, and time.  Psychiatric:        Mood and Affect: Mood normal. Affect is tearful.        Behavior: Behavior normal. Behavior is cooperative.        Thought Content: Thought content normal.        Judgment: Judgment normal.     ASSESSMENT/PLAN:   1. Essential hypertension - Urinalysis  2. Tobacco use disorder, continuous Smoking cessation instruction/counseling given:  counseled patient on the dangers of tobacco use, advised patient to stop smoking, and reviewed strategies to maximize success  3. ANXIETY DEPRESSION Continue with paxil 40 mg. Will add hydroxyzine for intermittent anxiety. Discussed that she can be referred to psychiatry for further evaluation  and management. No benzos prescribed by this provider today for her anxiety.  - hydrOXYzine (ATARAX/VISTARIL) 10 MG tablet; Take 1 tablet (10 mg total) by mouth 3 (three) times daily as needed.  Dispense: 90 tablet; Refill: 0      Return in about 6 months (around 03/10/2019) for htn.    The patient was given clear instructions to go to ER or return to medical center if symptoms do not improve, worsen or new problems develop. The patient verbalized understanding and agreed with plan of care.   Ms. Catherine Sou. Nathaneil Canary, FNP-BC  [facility] [address] [place], [place] [address] [phone]

## 2018-09-10 NOTE — Patient Instructions (Addendum)
Recommend seeking further treatment at [facility]. They have counselors and psychiatrist.  Address: [address], [place], [place] [address]  Phone: [phone]  Website: [email or web address]   Health Maintenance, Female Adopting a healthy lifestyle and getting preventive care are important in promoting health and wellness. Ask your health care provider about:  The right schedule for you to have regular tests and exams.  Things you can do on your own to prevent diseases and keep yourself healthy. What should I know about diet, weight, and exercise? Eat a healthy diet   Eat a diet that includes plenty of vegetables, fruits, low-fat dairy products, and lean protein.  Do not eat a lot of foods that are high in solid fats, added sugars, or sodium. Maintain a healthy weight Body mass index (BMI) is used to identify weight problems. It estimates body fat based on height and weight. Your health care provider can help determine your BMI and help you achieve or maintain a healthy weight. Get regular exercise Get regular exercise. This is one of the most important things you can do for your health. Most adults should:  Exercise for at least 150 minutes each week. The exercise should increase your heart rate and make you sweat (moderate-intensity exercise).  Do strengthening exercises at least twice a week. This is in addition to the moderate-intensity exercise.  Spend less time sitting. Even light physical activity can be beneficial. Watch cholesterol and blood lipids Have your blood tested for lipids and cholesterol at 63 years of age, then have this test every 5 years. Have your cholesterol levels checked more often if:  Your lipid or cholesterol levels are high.  You are older than 63 years of age.  You are at high risk for heart disease. What should I know about cancer screening? Depending on your health history and family history, you may need to have cancer screening  at various ages. This may include screening for:  Breast cancer.  Cervical cancer.  Colorectal cancer.  Skin cancer.  Lung cancer. What should I know about heart disease, diabetes, and high blood pressure? Blood pressure and heart disease  High blood pressure causes heart disease and increases the risk of stroke. This is more likely to develop in people who have high blood pressure readings, are of African descent, or are overweight.  Have your blood pressure checked: ? Every 3-5 years if you are 72-39 years of age. ? Every year if you are 2 years old or older. Diabetes Have regular diabetes screenings. This checks your fasting blood sugar level. Have the screening done:  Once every three years after age 78 if you are at a normal weight and have a low risk for diabetes.  More often and at a younger age if you are overweight or have a high risk for diabetes. What should I know about preventing infection? Hepatitis B If you have a higher risk for hepatitis B, you should be screened for this virus. Talk with your health care provider to find out if you are at risk for hepatitis B infection. Hepatitis C Testing is recommended for:  Everyone born from [birth year] through 1965.  Anyone with known risk factors for hepatitis C. Sexually transmitted infections (STIs)  Get screened for STIs, including gonorrhea and chlamydia, if: ? You are sexually active and are younger than 63 years of age. ? You are older than 63 years of age and your health care provider tells you that you are at risk  for this type of infection. ? Your sexual activity has changed since you were last screened, and you are at increased risk for chlamydia or gonorrhea. Ask your health care provider if you are at risk.  Ask your health care provider about whether you are at high risk for HIV. Your health care provider may recommend a prescription medicine to help prevent HIV infection. If you choose to take medicine to  prevent HIV, you should first get tested for HIV. You should then be tested every 3 months for as long as you are taking the medicine. Pregnancy  If you are about to stop having your period (premenopausal) and you may become pregnant, seek counseling before you get pregnant.  Take 400 to 800 micrograms (mcg) of folic acid every day if you become pregnant.  Ask for birth control (contraception) if you want to prevent pregnancy. Osteoporosis and menopause Osteoporosis is a disease in which the bones lose minerals and strength with aging. This can result in bone fractures. If you are 49 years old or older, or if you are at risk for osteoporosis and fractures, ask your health care provider if you should:  Be screened for bone loss.  Take a calcium or vitamin D supplement to lower your risk of fractures.  Be given hormone replacement therapy (HRT) to treat symptoms of menopause. Follow these instructions at home: Lifestyle  Do not use any products that contain nicotine or tobacco, such as cigarettes, e-cigarettes, and chewing tobacco. If you need help quitting, ask your health care provider.  Do not use street drugs.  Do not share needles.  Ask your health care provider for help if you need support or information about quitting drugs. Alcohol use  Do not drink alcohol if: ? Your health care provider tells you not to drink. ? You are pregnant, may be pregnant, or are planning to become pregnant.  If you drink alcohol: ? Limit how much you use to 0-1 drink a day. ? Limit intake if you are breastfeeding.  Be aware of how much alcohol is in your drink. In the [place] one drink equals one 12 oz bottle of beer (355 mL), one 5 oz glass of wine (148 mL), or one 1 oz glass of hard liquor (44 mL). General instructions  Schedule regular health, dental, and eye exams.  Stay current with your vaccines.  Tell your health care provider if: ? You often feel depressed. ? You have ever been  abused or do not feel safe at home. Summary  Adopting a healthy lifestyle and getting preventive care are important in promoting health and wellness.  Follow your health care provider's instructions about healthy diet, exercising, and getting tested or screened for diseases.  Follow your health care provider's instructions on monitoring your cholesterol and blood pressure. This information is not intended to replace advice given to you by your health care provider. Make sure you discuss any questions you have with your health care provider. Document Released: 07/09/2010 Document Revised: 12/17/2017 Document Reviewed: 12/17/2017 Elsevier Patient Education  2020 [employer]. Steps to Quit Smoking Smoking tobacco is the leading cause of preventable death. It can affect almost every organ in the body. Smoking puts you and people around you at risk for many serious, long-lasting (chronic) diseases. Quitting smoking can be hard, but it is one of the best things that you can do for your health. It is never too late to quit. How do I get ready to quit? When you decide  to quit smoking, make a plan to help you succeed. Before you quit:  Pick a date to quit. Set a date within the next 2 weeks to give you time to prepare.  Write down the reasons why you are quitting. Keep this list in places where you will see it often.  Tell your family, friends, and co-workers that you are quitting. Their support is important.  Talk with your doctor about the choices that may help you quit.  Find out if your health insurance will pay for these treatments.  Know the people, places, things, and activities that make you want to smoke (triggers). Avoid them. What first steps can I take to quit smoking?  Throw away all cigarettes at home, at work, and in your car.  Throw away the things that you use when you smoke, such as ashtrays and lighters.  Clean your car. Make sure to empty the ashtray.  Clean your home,  including curtains and carpets. What can I do to help me quit smoking? Talk with your doctor about taking medicines and seeing a counselor at the same time. You are more likely to succeed when you do both.  If you are pregnant or breastfeeding, talk with your doctor about counseling or other ways to quit smoking. Do not take medicine to help you quit smoking unless your doctor tells you to do so. To quit smoking: Quit right away  Quit smoking totally, instead of slowly cutting back on how much you smoke over a period of time.  Go to counseling. You are more likely to quit if you go to counseling sessions regularly. Take medicine You may take medicines to help you quit. Some medicines need a prescription, and some you can buy over-the-counter. Some medicines may contain a drug called nicotine to replace the nicotine in cigarettes. Medicines may:  Help you to stop having the desire to smoke (cravings).  Help to stop the problems that come when you stop smoking (withdrawal symptoms). Your doctor may ask you to use:  Nicotine patches, gum, or lozenges.  Nicotine inhalers or sprays.  Non-nicotine medicine that is taken by mouth. Find resources Find resources and other ways to help you quit smoking and remain smoke-free after you quit. These resources are most helpful when you use them often. They include:  Online chats with a Social worker.  Phone quitlines.  Printed Furniture conservator/restorer.  Support groups or group counseling.  Text messaging programs.  Mobile phone apps. Use apps on your mobile phone or tablet that can help you stick to your quit plan. There are many free apps for mobile phones and tablets as well as websites. Examples include Quit Guide from the [employer] and smokefree.gov  What things can I do to make it easier to quit?   Talk to your family and friends. Ask them to support and encourage you.  Call a phone quitline (1-800-QUIT-NOW), reach out to support groups, or work  with a Social worker.  Ask people who smoke to not smoke around you.  Avoid places that make you want to smoke, such as: ? Bars. ? Parties. ? Smoke-break areas at work.  Spend time with people who do not smoke.  Lower the stress in your life. Stress can make you want to smoke. Try these things to help your stress: ? Getting regular exercise. ? Doing deep-breathing exercises. ? Doing yoga. ? Meditating. ? Doing a body scan. To do this, close your eyes, focus on one area of your body at  a time from head to toe. Notice which parts of your body are tense. Try to relax the muscles in those areas. How will I feel when I quit smoking? Day 1 to 3 weeks Within the first 24 hours, you may start to have some problems that come from quitting tobacco. These problems are very bad 2-3 days after you quit, but they do not often last for more than 2-3 weeks. You may get these symptoms:  Mood swings.  Feeling restless, nervous, angry, or annoyed.  Trouble concentrating.  Dizziness.  Strong desire for high-sugar foods and nicotine.  Weight gain.  Trouble pooping (constipation).  Feeling like you may vomit (nausea).  Coughing or a sore throat.  Changes in how the medicines that you take for other issues work in your body.  Depression.  Trouble sleeping (insomnia). Week 3 and afterward After the first 2-3 weeks of quitting, you may start to notice more positive results, such as:  Better sense of smell and taste.  Less coughing and sore throat.  Slower heart rate.  Lower blood pressure.  Clearer skin.  Better breathing.  Fewer sick days. Quitting smoking can be hard. Do not give up if you fail the first time. Some people need to try a few times before they succeed. Do your best to stick to your quit plan, and talk with your doctor if you have any questions or concerns. Summary  Smoking tobacco is the leading cause of preventable death. Quitting smoking can be hard, but it is one  of the best things that you can do for your health.  When you decide to quit smoking, make a plan to help you succeed.  Quit smoking right away, not slowly over a period of time.  When you start quitting, seek help from your doctor, family, or friends. This information is not intended to replace advice given to you by your health care provider. Make sure you discuss any questions you have with your health care provider. Document Released: 10/20/2008 Document Revised: 03/13/2018 Document Reviewed: 03/14/2018 Elsevier Patient Education  2020 [employer].

## 2018-09-16 ENCOUNTER — Encounter (HOSPITAL_COMMUNITY): Payer: Self-pay

## 2018-09-16 ENCOUNTER — Encounter (HOSPITAL_COMMUNITY): Payer: Self-pay | Admitting: *Deleted

## 2018-10-14 MED FILL — LOSARTAN POTASSIUM 50 MG TA: 50 | 30 days supply | Qty: 30 | Fill #1

## 2018-10-14 MED FILL — OMEPRAZOLE 20 MG CAP: 20 | 30 days supply | Qty: 30 | Fill #1

## 2018-10-14 MED FILL — PARoxetine HCL 40 MG TABS: 40 | 30 days supply | Qty: 30 | Fill #1

## 2018-11-17 MED FILL — LOSARTAN POTASSIUM 50 MG TA: 50 | 30 days supply | Qty: 30 | Fill #2

## 2018-11-17 MED FILL — PARoxetine HCL 40 MG TABS: 40 | 30 days supply | Qty: 30 | Fill #2

## 2018-11-17 MED FILL — PRAVASTATIN SODIUM 20 MG TA: 20 | 90 days supply | Qty: 90 | Fill #1

## 2018-11-17 MED FILL — OMEPRAZOLE 20 MG CAP: 20 | 30 days supply | Qty: 30 | Fill #2

## 2018-12-16 ENCOUNTER — Other Ambulatory Visit: Payer: Self-pay | Admitting: Family Medicine

## 2018-12-16 DIAGNOSIS — K219 Gastro-esophageal reflux disease without esophagitis: Secondary | ICD-10-CM

## 2018-12-16 MED FILL — PARoxetine HCL 40 MG TABS: 40 | 30 days supply | Qty: 30 | Fill #3

## 2018-12-16 MED FILL — OMEPRAZOLE 20 MG CAP: 20 | 30 days supply | Qty: 30 | Fill #0

## 2018-12-16 MED FILL — TRIAMTERENE-HCTZ 37.5-25 MG: 37.5-25 | 90 days supply | Qty: 90 | Fill #1

## 2018-12-16 MED FILL — LOSARTAN POTASSIUM 50 MG TA: 50 | 30 days supply | Qty: 30 | Fill #3

## 2019-01-21 MED FILL — OMEPRAZOLE 20 MG CAP: 20 | 30 days supply | Qty: 30 | Fill #1

## 2019-01-21 MED FILL — LOSARTAN POTASSIUM 50 MG TA: 50 | 30 days supply | Qty: 30 | Fill #4

## 2019-02-05 MED FILL — PARoxetine HCL 40 MG TABS: 40 | 30 days supply | Qty: 30 | Fill #4

## 2019-02-24 MED FILL — OMEPRAZOLE 20 MG CAP: 20 | 30 days supply | Qty: 30 | Fill #2

## 2019-02-24 MED FILL — LOSARTAN POTASSIUM 50 MG TA: 50 | 30 days supply | Qty: 30 | Fill #5

## 2019-03-08 DIAGNOSIS — R002 Palpitations: Secondary | ICD-10-CM

## 2019-03-08 HISTORY — DX: Palpitations: R00.2

## 2019-03-11 ENCOUNTER — Other Ambulatory Visit: Payer: Self-pay | Admitting: Family Medicine

## 2019-03-11 DIAGNOSIS — E785 Hyperlipidemia, unspecified: Secondary | ICD-10-CM

## 2019-03-11 MED FILL — PARoxetine HCL 40 MG TABS: 40 | 30 days supply | Qty: 30 | Fill #5

## 2019-03-11 MED FILL — PRAVASTATIN SODIUM 20 MG TA: 20 | 30 days supply | Qty: 30 | Fill #0

## 2019-03-12 ENCOUNTER — Other Ambulatory Visit: Payer: Self-pay

## 2019-03-12 ENCOUNTER — Encounter: Payer: Self-pay | Admitting: Family Medicine

## 2019-03-12 ENCOUNTER — Ambulatory Visit (INDEPENDENT_AMBULATORY_CARE_PROVIDER_SITE_OTHER): Payer: Self-pay | Admitting: Family Medicine

## 2019-03-12 ENCOUNTER — Telehealth: Payer: Self-pay

## 2019-03-12 VITALS — BP 133/80 | HR 92 | Temp 98.6°F | Ht 64.0 in | Wt 160.6 lb

## 2019-03-12 DIAGNOSIS — I10 Essential (primary) hypertension: Secondary | ICD-10-CM

## 2019-03-12 DIAGNOSIS — Z8709 Personal history of other diseases of the respiratory system: Secondary | ICD-10-CM

## 2019-03-12 DIAGNOSIS — Z8679 Personal history of other diseases of the circulatory system: Secondary | ICD-10-CM

## 2019-03-12 DIAGNOSIS — F419 Anxiety disorder, unspecified: Secondary | ICD-10-CM

## 2019-03-12 DIAGNOSIS — Z9889 Other specified postprocedural states: Secondary | ICD-10-CM

## 2019-03-12 DIAGNOSIS — Z09 Encounter for follow-up examination after completed treatment for conditions other than malignant neoplasm: Secondary | ICD-10-CM

## 2019-03-12 DIAGNOSIS — Z0000 Encounter for general adult medical examination without abnormal findings: Secondary | ICD-10-CM

## 2019-03-12 DIAGNOSIS — R002 Palpitations: Secondary | ICD-10-CM

## 2019-03-12 LAB — POCT URINALYSIS DIPSTICK
Bilirubin, UA: NEGATIVE
Glucose, UA: NEGATIVE
Ketones, UA: NEGATIVE
Leukocytes, UA: NEGATIVE
Nitrite, UA: NEGATIVE
Protein, UA: NEGATIVE
Spec Grav, UA: 1.025 (ref 1.010–1.025)
Urobilinogen, UA: 1 E.U./dL
pH, UA: 6.5 (ref 5.0–8.0)

## 2019-03-12 LAB — POCT GLYCOSYLATED HEMOGLOBIN (HGB A1C): Hemoglobin A1C: 5.4 % (ref 4.0–5.6)

## 2019-03-12 LAB — GLUCOSE, POCT (MANUAL RESULT ENTRY): POC Glucose: 129 mg/dl — AB (ref 70–99)

## 2019-03-12 NOTE — Progress Notes (Signed)
Patient [facility]    Established Patient Office Visit  Subjective:  Patient ID: Catherine Gilmore, female    DOB: 09-06-1955  Age: 64 y.o. MRN: [id number]  CC:  Chief Complaint  Patient presents with  . Follow-up    HTN  . Sinus Problem    HPI Catherine Gilmore is a 64 year old female presents for Follow Up today.   Past Medical History:  Diagnosis Date  . Anxiety   . Back pain, chronic   . Flushing   . GERD (gastroesophageal reflux disease)   . Heart palpitations 03/2019  . History of gestational diabetes   . History of sinus problem   . Hyperlipidemia   . Hypertension   . Hypokalemia   . Mitral valve prolapse   . Polycythemia   . Supraventricular tachycardia, paroxysmal ([place]) 01/1998,02/1998   s/p ablation and cardioversion    Current Status: Since his last office visit, she has c/o left ear pain, sensation of 'fluid' build up, and tenderness. She has not used anything for aid in left ear pain. She has been having heart palpitation in her mild-chest. She reports moderate chest discomfort. She states that discomfort is intermittent and can last from 1 whole day to 30 minutes. She has history of heart ablations 01/1998 and 02/1998. Her anxiety has been increased lately. She denies suicidal ideations, homicidal ideations, or auditory hallucinations. He denies visual changes, chest pain, cough, shortness of breath, heart palpitations, and falls. He has occasional headaches and dizziness with position changes. Denies severe headaches, confusion, seizures, double vision, and blurred vision, nausea and vomiting. He denies fevers, chills, fatigue, recent infections, weight loss, and night sweats. No reports of GI problems such as diarrhea, and constipation. He has no reports of blood in stools, dysuria and hematuria. He denies pain today.   Past Surgical History:  Procedure Laterality Date  . SVT ablation     x2    Family History  Problem  Relation Age of Onset  . Cancer Mother   . Cirrhosis Father     Social History   Socioeconomic History  . Marital status: Legally Separated    Spouse name: Not on file  . Number of children: Not on file  . Years of education: Not on file  . Highest education level: Not on file  Occupational History  . Not on file  Tobacco Use  . Smoking status: Current Every Day Smoker    Packs/day: 1.00  . Smokeless tobacco: Never Used  Substance and Sexual Activity  . Alcohol use: Yes    Comment: 3 to 4 times weekly   . Drug use: No  . Sexual activity: Yes  Other Topics Concern  . Not on file  Social History Narrative  . Not on file   Social Determinants of Health   Financial Resource Strain:   . Difficulty of Paying Living Expenses: Not on file  Food Insecurity:   . Worried About Charity fundraiser in the Last Year: Not on file  . Ran Out of Food in the Last Year: Not on file  Transportation Needs:   . Lack of Transportation (Medical): Not on file  . Lack of Transportation (Non-Medical): Not on file  Physical Activity:   . Days of Exercise per Week: Not on file  . Minutes of Exercise per Session: Not on file  Stress:   . Feeling of Stress : Not on file  Social Connections:   . Frequency  of Communication with Friends and Family: Not on file  . Frequency of Social Gatherings with Friends and Family: Not on file  . Attends Religious Services: Not on file  . Active Member of Clubs or Organizations: Not on file  . Attends Archivist Meetings: Not on file  . Marital Status: Not on file  Intimate Partner Violence:   . Fear of Current or Ex-Partner: Not on file  . Emotionally Abused: Not on file  . Physically Abused: Not on file  . Sexually Abused: Not on file    Outpatient Medications Prior to Visit  Medication Sig Dispense Refill  . cetirizine (ZYRTEC) 10 MG tablet Take 1 tablet (10 mg total) by mouth daily. 30 tablet 2  . fluticasone (FLONASE) 50 MCG/ACT nasal  spray Place 1 spray into both nostrils daily. 16 g 2  . hydrOXYzine (ATARAX/VISTARIL) 10 MG tablet Take 1 tablet (10 mg total) by mouth 3 (three) times daily as needed. 90 tablet 0  . losartan (COZAAR) 50 MG tablet Take 1 tablet (50 mg total) by mouth daily. 30 tablet 11  . omeprazole (PRILOSEC) 20 MG capsule TAKE 1 CAPSULE (20 MG TOTAL) BY MOUTH DAILY. 30 capsule 2  . PARoxetine (PAXIL) 40 MG tablet Take 1 tablet (40 mg total) by mouth every morning. 90 tablet 1  . pravastatin (PRAVACHOL) 20 MG tablet TAKE 1 TABLET (20 MG TOTAL) BY MOUTH DAILY. 90 tablet 1  . triamterene-hydrochlorothiazide (MAXZIDE-25) 37.5-25 MG tablet Take 1 tablet by mouth daily. 90 tablet 3   No facility-administered medications prior to visit.    Allergies  Allergen Reactions  . Lisinopril     REACTION: Cough    ROS Review of Systems  Constitutional: Negative.   HENT: Negative.        Ear/Sinus discomfort.   Eyes: Negative.   Respiratory: Negative.   Cardiovascular: Positive for palpitations (occasional ).  Gastrointestinal: Negative.   Endocrine: Negative.   Genitourinary: Negative.   Musculoskeletal: Negative.   Skin: Negative.   Allergic/Immunologic: Negative.   Neurological: Positive for dizziness (occasional ) and headaches (occasional ).  Hematological: Negative.   Psychiatric/Behavioral: The patient is nervous/anxious.     Objective:    Physical Exam  Constitutional: She is oriented to person, place, and time. She appears well-developed and well-nourished.  HENT:  Head: Normocephalic and atraumatic.  Eyes: Conjunctivae are normal.  Cardiovascular: Normal rate, regular rhythm, normal heart sounds and intact distal pulses.  Pulmonary/Chest: Effort normal and breath sounds normal.  Abdominal: Soft. Bowel sounds are normal.  Musculoskeletal:        General: Normal range of motion.     Cervical back: Normal range of motion and neck supple.  Neurological: She is alert and oriented to person,  place, and time. She has normal reflexes.  Skin: Skin is warm and dry.  Psychiatric: She has a normal mood and affect. Her behavior is normal. Judgment and thought content normal.  Vitals reviewed.   BP 133/80   Pulse 92   Temp 98.6 F (37 C)   Ht 5\' 4"  (1.626 m)   Wt 160 lb 9.6 oz (72.8 kg)   SpO2 99%   BMI 27.57 kg/m  Wt Readings from Last 3 Encounters:  03/12/19 160 lb 9.6 oz (72.8 kg)  09/10/18 155 lb (70.3 kg)  05/11/18 158 lb 15.2 oz (72.1 kg)     Health Maintenance Due  Topic Date Due  . Hepatitis C Screening  [birth year]  . HIV Screening  04/14/1970  .  COLONOSCOPY  04/13/2005  . MAMMOGRAM  02/03/2011  . PAP SMEAR-Modifier  06/15/2018    There are no preventive care reminders to display for this patient.  Lab Results  Component Value Date   TSH 2.660 03/12/2019   Lab Results  Component Value Date   WBC 8.4 03/12/2019   HGB 15.0 03/12/2019   HCT 42.3 03/12/2019   MCV 93 03/12/2019   PLT 331 03/12/2019   Lab Results  Component Value Date   NA 132 (L) 03/12/2019   K 3.9 03/12/2019   CO2 24 03/12/2019   GLUCOSE 96 03/12/2019   BUN 14 03/12/2019   CREATININE 0.77 03/12/2019   BILITOT 0.6 03/12/2019   ALKPHOS 60 03/12/2019   AST 21 03/12/2019   ALT 19 03/12/2019   PROT 6.6 03/12/2019   ALBUMIN 4.6 03/12/2019   CALCIUM 9.7 03/12/2019   Lab Results  Component Value Date   CHOL 210 (H) 03/12/2019   Lab Results  Component Value Date   HDL 95 03/12/2019   Lab Results  Component Value Date   LDLCALC 101 (H) 03/12/2019   Lab Results  Component Value Date   TRIG 82 03/12/2019   Lab Results  Component Value Date   CHOLHDL 2.2 03/12/2019   Lab Results  Component Value Date   HGBA1C 5.4 03/12/2019      Assessment & Plan:   1. Essential hypertension The current medical regimen is effective; blood pressure is stable at 133/80 today; continue present plan and medications as prescribed. She will continue to take medications as prescribed, to  decrease high sodium intake, excessive alcohol intake, increase potassium intake, smoking cessation, and increase physical activity of at least 30 minutes of cardio activity daily. She will continue to follow Heart Healthy or DASH diet.  2. Heart palpitations Occasional.  3. History of supraventricular tachycardia  4. History of radiofrequency ablation procedure for cardiac arrhythmia  5. Anxiety Stable today.  6. History of sinus problem  7. Healthcare maintenance - POCT glycosylated hemoglobin (Hb A1C) - POCT urinalysis dipstick - POCT glucose (manual entry) - CBC with Differential - Comprehensive metabolic panel - Lipid Panel - TSH - Vitamin B12 - Vitamin D, 25-hydroxy  8. Follow up She will follow up in 3 months.   No orders of the defined types were placed in this encounter.   Orders Placed This Encounter  Procedures  . CBC with Differential  . Comprehensive metabolic panel  . Lipid Panel  . TSH  . Vitamin B12  . Vitamin D, 25-hydroxy  . POCT glycosylated hemoglobin (Hb A1C)  . POCT urinalysis dipstick  . POCT glucose (manual entry)    Referral Orders  No referral(s) requested today    Kathe Becton,  MSN, FNP-BC [address], [place] [address] [phone]- fax   Problem List Items Addressed This Visit      Cardiovascular and Mediastinum   Essential hypertension - Primary    Other Visit Diagnoses    Heart palpitations       History of supraventricular tachycardia       History of radiofrequency ablation procedure for cardiac arrhythmia       Anxiety       History of sinus problem       Healthcare maintenance       Relevant Orders   POCT glycosylated hemoglobin (Hb A1C) (Completed)   POCT urinalysis dipstick (Completed)   POCT glucose (  manual entry) (Completed)   CBC with Differential (Completed)   Comprehensive metabolic panel  (Completed)   Lipid Panel (Completed)   TSH (Completed)   Vitamin B12 (Completed)   Vitamin D, 25-hydroxy (Completed)   Follow up          No orders of the defined types were placed in this encounter.   Follow-up: No follow-ups on file.    Azzie Glatter, FNP

## 2019-03-12 NOTE — Telephone Encounter (Signed)
It will be okay to send the Anxiety medication to [facility] on [address].

## 2019-03-13 LAB — CBC WITH DIFFERENTIAL/PLATELET
Basophils Absolute: 0 10*3/uL (ref 0.0–0.2)
Basos: 1 %
EOS (ABSOLUTE): 0.5 10*3/uL — ABNORMAL HIGH (ref 0.0–0.4)
Eos: 6 %
Hematocrit: 42.3 % (ref 34.0–46.6)
Hemoglobin: 15 g/dL (ref 11.1–15.9)
Immature Grans (Abs): 0.1 10*3/uL (ref 0.0–0.1)
Immature Granulocytes: 1 %
Lymphocytes Absolute: 1.9 10*3/uL (ref 0.7–3.1)
Lymphs: 22 %
MCH: 33.1 pg — ABNORMAL HIGH (ref 26.6–33.0)
MCHC: 35.5 g/dL (ref 31.5–35.7)
MCV: 93 fL (ref 79–97)
Monocytes Absolute: 0.7 10*3/uL (ref 0.1–0.9)
Monocytes: 9 %
Neutrophils Absolute: 5.2 10*3/uL (ref 1.4–7.0)
Neutrophils: 61 %
Platelets: 331 10*3/uL (ref 150–450)
RBC: 4.53 x10E6/uL (ref 3.77–5.28)
RDW: 13 % (ref 11.7–15.4)
WBC: 8.4 10*3/uL (ref 3.4–10.8)

## 2019-03-13 LAB — LIPID PANEL
Chol/HDL Ratio: 2.2 ratio (ref 0.0–4.4)
Cholesterol, Total: 210 mg/dL — ABNORMAL HIGH (ref 100–199)
HDL: 95 mg/dL (ref >39)
LDL Chol Calc (NIH): 101 mg/dL — ABNORMAL HIGH (ref 0–99)
Triglycerides: 82 mg/dL (ref 0–149)
VLDL Cholesterol Cal: 14 mg/dL (ref 5–40)

## 2019-03-13 LAB — COMPREHENSIVE METABOLIC PANEL
ALT: 19 IU/L (ref 0–32)
AST: 21 IU/L (ref 0–40)
Albumin/Globulin Ratio: 2.3 — ABNORMAL HIGH (ref 1.2–2.2)
Albumin: 4.6 g/dL (ref 3.8–4.8)
Alkaline Phosphatase: 60 IU/L (ref 39–117)
BUN/Creatinine Ratio: 18 (ref 12–28)
BUN: 14 mg/dL (ref 8–27)
Bilirubin Total: 0.6 mg/dL (ref 0.0–1.2)
CO2: 24 mmol/L (ref 20–29)
Calcium: 9.7 mg/dL (ref 8.7–10.3)
Chloride: 94 mmol/L — ABNORMAL LOW (ref 96–106)
Creatinine, Ser: 0.77 mg/dL (ref 0.57–1.00)
GFR calc Af Amer: 95 mL/min/{1.73_m2} (ref >59)
GFR calc non Af Amer: 82 mL/min/{1.73_m2} (ref >59)
Globulin, Total: 2 g/dL (ref 1.5–4.5)
Glucose: 96 mg/dL (ref 65–99)
Potassium: 3.9 mmol/L (ref 3.5–5.2)
Sodium: 132 mmol/L — ABNORMAL LOW (ref 134–144)
Total Protein: 6.6 g/dL (ref 6.0–8.5)

## 2019-03-13 LAB — TSH: TSH: 2.66 u[IU]/mL (ref 0.450–4.500)

## 2019-03-13 LAB — VITAMIN B12: Vitamin B-12: 495 pg/mL (ref 232–1245)

## 2019-03-13 LAB — VITAMIN D 25 HYDROXY (VIT D DEFICIENCY, FRACTURES): Vit D, 25-Hydroxy: 62.9 ng/mL (ref 30.0–100.0)

## 2019-03-14 ENCOUNTER — Other Ambulatory Visit: Payer: Self-pay | Admitting: Family Medicine

## 2019-03-14 DIAGNOSIS — F419 Anxiety disorder, unspecified: Secondary | ICD-10-CM

## 2019-03-14 MED ORDER — LORAZEPAM 0.5 MG PO TABS: 0.5000 mg | ORAL_TABLET | Freq: Every day | ORAL | 0 refills | Status: DC

## 2019-03-15 ENCOUNTER — Telehealth: Payer: Self-pay | Admitting: Nurse Practitioner

## 2019-03-16 NOTE — Telephone Encounter (Signed)
Sent to [facility] 

## 2019-03-17 ENCOUNTER — Encounter: Payer: Self-pay | Admitting: Family Medicine

## 2019-03-17 DIAGNOSIS — F419 Anxiety disorder, unspecified: Secondary | ICD-10-CM | POA: Insufficient documentation

## 2019-03-17 DIAGNOSIS — Z8709 Personal history of other diseases of the respiratory system: Secondary | ICD-10-CM | POA: Insufficient documentation

## 2019-04-01 ENCOUNTER — Other Ambulatory Visit: Payer: Self-pay | Admitting: Internal Medicine

## 2019-04-01 ENCOUNTER — Other Ambulatory Visit: Payer: Self-pay | Admitting: Family Medicine

## 2019-04-01 DIAGNOSIS — K219 Gastro-esophageal reflux disease without esophagitis: Secondary | ICD-10-CM

## 2019-04-01 DIAGNOSIS — F341 Dysthymic disorder: Secondary | ICD-10-CM

## 2019-04-01 MED FILL — TRIAMTERENE-HCTZ 37.5-25 MG: 37.5-25 | 30 days supply | Qty: 30 | Fill #2

## 2019-04-01 MED FILL — OMEPRAZOLE 20 MG CAP: 20 | 30 days supply | Qty: 30 | Fill #0

## 2019-04-01 MED FILL — LOSARTAN POTASSIUM 50 MG TA: 50 | 30 days supply | Qty: 30 | Fill #6

## 2019-04-01 MED FILL — PRAVASTATIN SODIUM 20 MG TA: 20 | 30 days supply | Qty: 30 | Fill #1

## 2019-04-21 MED FILL — PARoxetine HCL 40 MG TABS: 40 | 30 days supply | Qty: 30 | Fill #0

## 2019-05-12 MED FILL — OMEPRAZOLE 20 MG CAP: 20 | 30 days supply | Qty: 30 | Fill #1

## 2019-05-12 MED FILL — TRIAMTERENE-HCTZ 37.5-25 MG: 37.5-25 | 30 days supply | Qty: 30 | Fill #3

## 2019-05-12 MED FILL — LOSARTAN POTASSIUM 50 MG TA: 50 | 30 days supply | Qty: 30 | Fill #7

## 2019-05-12 MED FILL — PRAVASTATIN SODIUM 20 MG TA: 20 | 30 days supply | Qty: 30 | Fill #2

## 2019-06-02 MED FILL — PARoxetine HCL 40 MG TABS: 40 | 30 days supply | Qty: 30 | Fill #1

## 2019-06-11 ENCOUNTER — Ambulatory Visit: Payer: Self-pay | Admitting: Nurse Practitioner

## 2019-06-17 ENCOUNTER — Other Ambulatory Visit: Payer: Self-pay | Admitting: Family Medicine

## 2019-06-17 DIAGNOSIS — I10 Essential (primary) hypertension: Secondary | ICD-10-CM

## 2019-06-18 ENCOUNTER — Other Ambulatory Visit: Payer: Self-pay

## 2019-06-18 ENCOUNTER — Encounter: Payer: Self-pay | Admitting: Nurse Practitioner

## 2019-06-18 ENCOUNTER — Ambulatory Visit (INDEPENDENT_AMBULATORY_CARE_PROVIDER_SITE_OTHER): Payer: Self-pay | Admitting: Nurse Practitioner

## 2019-06-18 ENCOUNTER — Other Ambulatory Visit: Payer: Self-pay | Admitting: Nurse Practitioner

## 2019-06-18 DIAGNOSIS — I10 Essential (primary) hypertension: Secondary | ICD-10-CM

## 2019-06-18 DIAGNOSIS — K219 Gastro-esophageal reflux disease without esophagitis: Secondary | ICD-10-CM

## 2019-06-18 DIAGNOSIS — E785 Hyperlipidemia, unspecified: Secondary | ICD-10-CM

## 2019-06-18 DIAGNOSIS — F341 Dysthymic disorder: Secondary | ICD-10-CM

## 2019-06-18 MED ORDER — LOSARTAN POTASSIUM 50 MG PO TABS: 50.0000 mg | ORAL_TABLET | Freq: Every day | ORAL | 11 refills | Status: DC

## 2019-06-18 MED ORDER — PAROXETINE HCL 40 MG PO TABS: 40.0000 mg | ORAL_TABLET | ORAL | 1 refills | Status: DC

## 2019-06-18 MED ORDER — TRIAMTERENE-HCTZ 37.5-25 MG PO TABS: 1.0000 | ORAL_TABLET | Freq: Every day | ORAL | 3 refills | Status: DC

## 2019-06-18 MED ORDER — PRAVASTATIN SODIUM 20 MG PO TABS: 20.0000 mg | ORAL_TABLET | Freq: Every day | ORAL | 1 refills | Status: DC

## 2019-06-18 MED ORDER — OMEPRAZOLE 20 MG PO CPDR: 20.0000 mg | DELAYED_RELEASE_CAPSULE | Freq: Every day | ORAL | 2 refills | Status: DC

## 2019-06-18 NOTE — Progress Notes (Signed)
[place], [place]  [id number] Phone:  [phone]   Fax:  [phone]   Established Patient Office Visit  Subjective:  Patient ID: Catherine Gilmore, female    DOB: 01/13/1955  Age: 64 y.o. MRN: [id number]  CC:  Chief Complaint  Patient presents with  . Follow-up    3 month follow up     HPI Janie Stoffers presents for follow up. She  has a past medical history of Anxiety, Back pain, chronic, Flushing, GERD (gastroesophageal reflux disease), Heart palpitations (03/2019), History of gestational diabetes, History of sinus problem, History of sinus problem, Hyperlipidemia, Hypertension, Hypokalemia, Mitral valve prolapse, Polycythemia, and Supraventricular tachycardia, paroxysmal ([place]) (01/1998,02/1998).   Anxiety Patient is here for evaluation of anxiety.  She has the following anxiety symptoms: difficulty concentrating, insomnia. Onset of symptoms was approximately many yeardsago.  Symptoms have been worsened since that time. She denies current suicidal and homicidal ideation. Family history significant for alcoholism.Possible organic causes contributing are: environmental . Risk factors: previous episode of depression Previous treatment includes medication Ativan, BuSpar, Paxil and hydrozine.   She complains of the following medication side effects: ineffective; hydroxizine. She has been on Buspar and this was discontinued but she is sure why but does not want to restart. She admits that she has not seen psychiatry in the past . She admits that Ativan relaxes her where she takes the edge off.   She has applied for the orange card however was denied due to husbands income.   Hypertension Patient is here for follow-up of elevated blood pressure. She is not exercising and is adherent to a low-salt diet. Blood pressure is well controlled at home. Cardiac symptoms: none. Patient denies chest pain, claudication, dyspnea, exertional chest  pressure/discomfort, irregular heart beat, lower extremity edema, palpitations and syncope. Cardiovascular risk factors: dyslipidemia, sedentary lifestyle and smoking/ tobacco exposure. Use of agents associated with hypertension: none . History of target organ damage: Mitral valve prolapse and SVT . She has nocturia .She is up 3-4 times per day. 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; only employee results in limited breaks.    Past Medical History:  Diagnosis Date  . Anxiety   . Back pain, chronic   . Flushing   . GERD (gastroesophageal reflux disease)   . Heart palpitations 03/2019  . History of gestational diabetes   . History of sinus problem   . History of sinus problem   . Hyperlipidemia   . Hypertension   . Hypokalemia   . Mitral valve prolapse   . Polycythemia   . Supraventricular tachycardia, paroxysmal ([place]) 01/1998,02/1998   s/p ablation and cardioversion     Past Surgical History:  Procedure Laterality Date  . SVT ablation     x2    Family History  Problem Relation Age of Onset  . Cancer Mother   . Cirrhosis Father     Social History   Socioeconomic History  . Marital status: Legally Separated    Spouse name: Not on file  . Number of children: Not on file  . Years of education: Not on file  . Highest education level: Not on file  Occupational History  . Not on file  Tobacco Use  . Smoking status: Current Every Day Smoker    Packs/day: 1.00  . Smokeless tobacco: Never Used  Vaping Use  .  Vaping Use: Never used  Substance and Sexual Activity  . Alcohol use: Yes    Comment: 3 to 4 times weekly   . Drug use: No  . Sexual activity: Yes  Other Topics Concern  . Not on file  Social History Narrative  . Not on file   Social Determinants of Health   Financial Resource Strain:   . Difficulty of Paying Living Expenses:   Food Insecurity:   . Worried About  Charity fundraiser in the Last Year:   . Arboriculturist in the Last Year:   Transportation Needs:   . Film/video editor (Medical):   Marland Kitchen Lack of Transportation (Non-Medical):   Physical Activity:   . Days of Exercise per Week:   . Minutes of Exercise per Session:   Stress:   . Feeling of Stress :   Social Connections:   . Frequency of Communication with Friends and Family:   . Frequency of Social Gatherings with Friends and Family:   . Attends Religious Services:   . Active Member of Clubs or Organizations:   . Attends Archivist Meetings:   Marland Kitchen Marital Status:   Intimate Partner Violence:   . Fear of Current or Ex-Partner:   . Emotionally Abused:   Marland Kitchen Physically Abused:   . Sexually Abused:     Outpatient Medications Prior to Visit  Medication Sig Dispense Refill  . losartan (COZAAR) 50 MG tablet Take 1 tablet (50 mg total) by mouth daily. 30 tablet 11  . omeprazole (PRILOSEC) 20 MG capsule TAKE 1 CAPSULE (20 MG TOTAL) BY MOUTH DAILY. 30 capsule 2  . PARoxetine (PAXIL) 40 MG tablet TAKE 1 TABLET (40 MG TOTAL) BY MOUTH EVERY MORNING. 30 tablet 1  . pravastatin (PRAVACHOL) 20 MG tablet TAKE 1 TABLET (20 MG TOTAL) BY MOUTH DAILY. 90 tablet 1  . triamterene-hydrochlorothiazide (MAXZIDE-25) 37.5-25 MG tablet Take 1 tablet by mouth daily. 90 tablet 3  . cetirizine (ZYRTEC) 10 MG tablet Take 1 tablet (10 mg total) by mouth daily. (Patient not taking: Reported on 06/18/2019) 30 tablet 2  . fluticasone (FLONASE) 50 MCG/ACT nasal spray Place 1 spray into both nostrils daily. (Patient not taking: Reported on 06/18/2019) 16 g 2  . hydrOXYzine (ATARAX/VISTARIL) 10 MG tablet Take 1 tablet (10 mg total) by mouth 3 (three) times daily as needed. (Patient not taking: Reported on 06/18/2019) 90 tablet 0  . LORazepam (ATIVAN) 0.5 MG tablet Take 1 tablet (0.5 mg total) by mouth at bedtime. (Patient not taking: Reported on 06/18/2019) 30 tablet 0   No facility-administered medications prior  to visit.    Allergies  Allergen Reactions  . Lisinopril     REACTION: Cough    ROS Review of Systems    Objective:    Physical Exam  BP 115/79 (BP Location: Left Arm, Patient Position: Sitting)   Pulse 79   Temp (!) 97.2 F (36.2 C) (Temporal)   Ht 5\' 4"  (1.626 m)   Wt 156 lb 9.6 oz (71 kg)   SpO2 99%   BMI 26.88 kg/m  Wt Readings from Last 3 Encounters:  06/18/19 156 lb 9.6 oz (71 kg)  03/12/19 160 lb 9.6 oz (72.8 kg)  09/10/18 155 lb (70.3 kg)     Health Maintenance Due  Topic Date Due  . Hepatitis C Screening  Never done  . COVID-19 Vaccine (1) Never done  . HIV Screening  Never done  . COLONOSCOPY  Never done  .  MAMMOGRAM  02/03/2011  . PAP SMEAR-Modifier  06/15/2018    There are no preventive care reminders to display for this patient.  Lab Results  Component Value Date   TSH 2.660 03/12/2019   Lab Results  Component Value Date   WBC 8.4 03/12/2019   HGB 15.0 03/12/2019   HCT 42.3 03/12/2019   MCV 93 03/12/2019   PLT 331 03/12/2019   Lab Results  Component Value Date   NA 135 06/18/2019   K 3.7 06/18/2019   CO2 24 03/12/2019   GLUCOSE 116 (H) 06/18/2019   BUN 14 06/18/2019   CREATININE 0.81 06/18/2019   BILITOT 0.6 06/18/2019   ALKPHOS 64 06/18/2019   AST 23 06/18/2019   ALT 19 03/12/2019   PROT 6.7 06/18/2019   ALBUMIN 4.6 06/18/2019   CALCIUM 9.6 06/18/2019   Lab Results  Component Value Date   CHOL 216 (H) 06/18/2019   Lab Results  Component Value Date   HDL 103 06/18/2019   Lab Results  Component Value Date   LDLCALC 101 (H) 06/18/2019   Lab Results  Component Value Date   TRIG 67 06/18/2019   Lab Results  Component Value Date   CHOLHDL 2.1 06/18/2019   Lab Results  Component Value Date   HGBA1C 5.4 03/12/2019      Assessment & Plan:   Problem List Items Addressed This Visit      Cardiovascular and Mediastinum   Essential hypertension   Relevant Medications   losartan (COZAAR) 50 MG tablet    pravastatin (PRAVACHOL) 20 MG tablet   triamterene-hydrochlorothiazide (MAXZIDE-25) 37.5-25 MG tablet   Other Relevant Orders   Comp. Metabolic Panel (12) (Completed)     Other   ANXIETY DEPRESSION   Relevant Medications   PARoxetine (PAXIL) 40 MG tablet Instructed pt that once she is able to obtain insurance that Psy referral will be made for beno tx    Other Visit Diagnoses    Gastroesophageal reflux disease       Relevant Medications   omeprazole (PRILOSEC) 20 MG capsule   Hyperlipidemia, unspecified hyperlipidemia type       Relevant Medications   losartan (COZAAR) 50 MG tablet   pravastatin (PRAVACHOL) 20 MG tablet   triamterene-hydrochlorothiazide (MAXZIDE-25) 37.5-25 MG tablet   Other Relevant Orders   Lipid panel (Completed)      Meds ordered this encounter  Medications  . losartan (COZAAR) 50 MG tablet    Sig: Take 1 tablet (50 mg total) by mouth daily.    Dispense:  30 tablet    Refill:  11    Order Specific Question:   Supervising Provider    Answer:   Tresa Garter [id number]  . omeprazole (PRILOSEC) 20 MG capsule    Sig: Take 1 capsule (20 mg total) by mouth daily.    Dispense:  30 capsule    Refill:  2    Order Specific Question:   Supervising Provider    Answer:   Tresa Garter [id number]  . PARoxetine (PAXIL) 40 MG tablet    Sig: Take 1 tablet (40 mg total) by mouth every morning.    Dispense:  30 tablet    Refill:  1    Order Specific Question:   Supervising Provider    Answer:   Tresa Garter [id number]  . pravastatin (PRAVACHOL) 20 MG tablet    Sig: Take 1 tablet (20 mg total) by mouth daily.    Dispense:  90 tablet  Refill:  1    Order Specific Question:   Supervising Provider    Answer:   Tresa Garter [id number]  . triamterene-hydrochlorothiazide (MAXZIDE-25) 37.5-25 MG tablet    Sig: Take 1 tablet by mouth daily.    Dispense:  90 tablet    Refill:  3    Order Specific Question:   Supervising Provider    Answer:    Tresa Garter [id number]    Follow-up: Return in about 3 months (around 09/18/2019).    Vevelyn Francois, NP

## 2019-06-18 NOTE — Patient Instructions (Signed)

## 2019-06-19 LAB — COMP. METABOLIC PANEL (12)
AST: 23 IU/L (ref 0–40)
Albumin/Globulin Ratio: 2.2 (ref 1.2–2.2)
Albumin: 4.6 g/dL (ref 3.8–4.8)
Alkaline Phosphatase: 64 IU/L (ref 48–121)
BUN/Creatinine Ratio: 17 (ref 12–28)
BUN: 14 mg/dL (ref 8–27)
Bilirubin Total: 0.6 mg/dL (ref 0.0–1.2)
Calcium: 9.6 mg/dL (ref 8.7–10.3)
Chloride: 95 mmol/L — ABNORMAL LOW (ref 96–106)
Creatinine, Ser: 0.81 mg/dL (ref 0.57–1.00)
GFR calc Af Amer: 89 mL/min/{1.73_m2} (ref >59)
GFR calc non Af Amer: 77 mL/min/{1.73_m2} (ref >59)
Globulin, Total: 2.1 g/dL (ref 1.5–4.5)
Glucose: 116 mg/dL — ABNORMAL HIGH (ref 65–99)
Potassium: 3.7 mmol/L (ref 3.5–5.2)
Sodium: 135 mmol/L (ref 134–144)
Total Protein: 6.7 g/dL (ref 6.0–8.5)

## 2019-06-19 LAB — LIPID PANEL
Chol/HDL Ratio: 2.1 ratio (ref 0.0–4.4)
Cholesterol, Total: 216 mg/dL — ABNORMAL HIGH (ref 100–199)
HDL: 103 mg/dL (ref >39)
LDL Chol Calc (NIH): 101 mg/dL — ABNORMAL HIGH (ref 0–99)
Triglycerides: 67 mg/dL (ref 0–149)
VLDL Cholesterol Cal: 12 mg/dL (ref 5–40)

## 2019-07-08 MED FILL — PARoxetine HCL 40 MG TABS: 40 | 30 days supply | Qty: 30 | Fill #0

## 2019-07-22 MED FILL — PRAVASTATIN SODIUM 20 MG TA: 20 | 30 days supply | Qty: 30 | Fill #4

## 2019-07-22 MED FILL — LOSARTAN POTASSIUM 50 MG TA: 50 | 30 days supply | Qty: 30 | Fill #1

## 2019-07-22 MED FILL — TRIAMTERENE-HCTZ 37.5-25 MG: 37.5-25 | 30 days supply | Qty: 30 | Fill #1

## 2019-07-22 MED FILL — OMEPRAZOLE 20 MG CAP: 20 | 30 days supply | Qty: 30 | Fill #0

## 2019-08-12 MED FILL — PARoxetine HCL 40 MG TABS: 40 | 30 days supply | Qty: 30 | Fill #1

## 2019-08-26 MED FILL — OMEPRAZOLE 20 MG CAP: 20 | 30 days supply | Qty: 30 | Fill #1

## 2019-08-26 MED FILL — TRIAMTERENE-HCTZ 37.5-25 MG: 37.5-25 | 30 days supply | Qty: 30 | Fill #2

## 2019-08-26 MED FILL — LOSARTAN POTASSIUM 50 MG TA: 50 | 30 days supply | Qty: 30 | Fill #2

## 2019-08-26 MED FILL — PRAVASTATIN SODIUM 20 MG TA: 20 | 30 days supply | Qty: 30 | Fill #5

## 2019-09-09 ENCOUNTER — Other Ambulatory Visit: Payer: Self-pay | Admitting: Nurse Practitioner

## 2019-09-09 DIAGNOSIS — F341 Dysthymic disorder: Secondary | ICD-10-CM

## 2019-09-10 MED FILL — PARoxetine HCL 40 MG TABS: 40 | 30 days supply | Qty: 30 | Fill #0

## 2019-09-17 ENCOUNTER — Ambulatory Visit (INDEPENDENT_AMBULATORY_CARE_PROVIDER_SITE_OTHER): Payer: Self-pay | Admitting: Nurse Practitioner

## 2019-09-17 ENCOUNTER — Encounter: Payer: Self-pay | Admitting: Nurse Practitioner

## 2019-09-17 ENCOUNTER — Other Ambulatory Visit: Payer: Self-pay

## 2019-09-17 VITALS — BP 119/79 | HR 72 | Temp 97.4°F | Ht 64.0 in | Wt 156.0 lb

## 2019-09-17 DIAGNOSIS — I10 Essential (primary) hypertension: Secondary | ICD-10-CM

## 2019-09-17 DIAGNOSIS — F341 Dysthymic disorder: Secondary | ICD-10-CM

## 2019-09-17 DIAGNOSIS — H938X1 Other specified disorders of right ear: Secondary | ICD-10-CM

## 2019-09-17 NOTE — Progress Notes (Signed)
[facility] Case Management Referral Note  09/17/2019 Name: Catherine Gilmore MRN: [id number] DOB: Jul 06, 1955 Catherine Gilmore is a 64 y.o. year old female who sees Vevelyn Francois, NP for primary care. LCSW was consulted to assess patient's needs and assist the patient with Financial Difficulties related to low income and lack of health coverage.  Interpreter: No.   Interpreter Name & Language: none  Assessment: Patient experiencing Financial constraints related to low income and lack of health coverage. Patient is uninsured and retired, but not yet eligible for [employer]. Reviewed [employer] and [employer] with patient today and provided instructions on gathering required supporting documents. Patient to follow up with CSW after completing application for assistance in scheduling appointment with financial counselor at [facility] to submit application.  Review of patient status, including review of consultants reports, relevant laboratory and other test results, and collaboration with appropriate care team members and the patient's provider was performed as part of comprehensive patient evaluation and provision of services.    SDOH (Social Determinants of Health) assessments performed: No    Outpatient Encounter Medications as of 09/17/2019  Medication Sig Note  . losartan (COZAAR) 50 MG tablet Take 1 tablet (50 mg total) by mouth daily.   Marland Kitchen omeprazole (PRILOSEC) 20 MG capsule Take 1 capsule (20 mg total) by mouth daily.   Marland Kitchen PARoxetine (PAXIL) 40 MG tablet TAKE 1 TABLET (40 MG TOTAL) BY MOUTH EVERY MORNING.   . pravastatin (PRAVACHOL) 20 MG tablet Take 1 tablet (20 mg total) by mouth daily.   Marland Kitchen triamterene-hydrochlorothiazide (MAXZIDE-25) 37.5-25 MG tablet Take 1 tablet by mouth daily.   . cetirizine (ZYRTEC) 10 MG tablet Take 1 tablet (10 mg total) by mouth daily. (Patient not taking: Reported on 06/18/2019) 03/12/2019: As needed.   . fluticasone (FLONASE) 50 MCG/ACT nasal spray Place  1 spray into both nostrils daily. (Patient not taking: Reported on 06/18/2019) 03/12/2019: As needed.   . hydrOXYzine (ATARAX/VISTARIL) 10 MG tablet Take 1 tablet (10 mg total) by mouth 3 (three) times daily as needed. (Patient not taking: Reported on 06/18/2019) 03/12/2019: For anxiety  . LORazepam (ATIVAN) 0.5 MG tablet Take 1 tablet (0.5 mg total) by mouth at bedtime. (Patient not taking: Reported on 06/18/2019)    No facility-administered encounter medications on file as of 09/17/2019.    Goals Addressed   None      Follow up Plan: 1. Patient to complete applications and follow up with CSW for assistance scheduling with financial counselor 2. CSW available from clinic as needed  Estanislado Emms, [facility] [phone]

## 2019-09-17 NOTE — Patient Instructions (Signed)
Stress, Adult Stress is a normal reaction to life events. Stress is what you feel when life demands more than you are used to, or more than you think you can handle. Some stress can be useful, such as studying for a test or meeting a deadline at work. Stress that occurs too often or for too long can cause problems. It can affect your emotional health and interfere with relationships and normal daily activities. Too much stress can weaken your body's defense system (immune system) and increase your risk for physical illness. If you already have a medical problem, stress can make it worse. What are the causes? All sorts of life events can cause stress. An event that causes stress for one person may not be stressful for another person. Major life events, whether positive or negative, commonly cause stress. Examples include:  Losing a job or starting a new job.  Losing a loved one.  Moving to a new town or home.  Getting married or divorced.  Having a baby.  Getting injured or sick. Less obvious life events can also cause stress, especially if they occur day after day or in combination with each other. Examples include:  Working long hours.  Driving in traffic.  Caring for children.  Being in debt.  Being in a difficult relationship. What are the signs or symptoms? Stress can cause emotional symptoms, including:  Anxiety. This is feeling worried, afraid, on edge, overwhelmed, or out of control.  Anger, including irritation or impatience.  Depression. This is feeling sad, down, helpless, or guilty.  Trouble focusing, remembering, or making decisions. Stress can cause physical symptoms, including:  Aches and pains. These may affect your head, neck, back, stomach, or other areas of your body.  Tight muscles or a clenched jaw.  Low energy.  Trouble sleeping. Stress can cause unhealthy behaviors, including:  Eating to feel better (overeating) or skipping meals.  Working too  much or putting off tasks.  Smoking, drinking alcohol, or using drugs to feel better. How is this diagnosed? Stress is diagnosed through an assessment by your health care provider. He or she may diagnose this condition based on:  Your symptoms and any stressful life events.  Your medical history.  Tests to rule out other causes of your symptoms. Depending on your condition, your health care provider may refer you to a specialist for further evaluation. How is this treated?  Stress management techniques are the recommended treatment for stress. Medicine is not typically recommended for the treatment of stress. Techniques to reduce your reaction to stressful life events include:  Stress identification. Monitor yourself for symptoms of stress and identify what causes stress for you. These skills may help you to avoid or prepare for stressful events.  Time management. Set your priorities, keep a calendar of events, and learn to say no. Taking these actions can help you avoid making too many commitments. Techniques for coping with stress include:  Rethinking the problem. Try to think realistically about stressful events rather than ignoring them or overreacting. Try to find the positives in a stressful situation rather than focusing on the negatives.  Exercise. Physical exercise can release both physical and emotional tension. The key is to find a form of exercise that you enjoy and do it regularly.  Relaxation techniques. These relax the body and mind. The key is to find one or more that you enjoy and use the techniques regularly. Examples include: ? Meditation, deep breathing, or progressive relaxation techniques. ? Yoga or   tai chi. ? Biofeedback, mindfulness techniques, or journaling. ? Listening to music, being out in nature, or participating in other hobbies.  Practicing a healthy lifestyle. Eat a balanced diet, drink plenty of water, limit or avoid caffeine, and get plenty of  sleep.  Having a strong support network. Spend time with family, friends, or other people you enjoy being around. Express your feelings and talk things over with someone you trust. Counseling or talk therapy with a mental health professional may be helpful if you are having trouble managing stress on your own. Follow these instructions at home: Lifestyle   Avoid drugs.  Do not use any products that contain nicotine or tobacco, such as cigarettes, e-cigarettes, and chewing tobacco. If you need help quitting, ask your health care provider.  Limit alcohol intake to no more than 1 drink a day for nonpregnant women and 2 drinks a day for men. One drink equals 12 oz of beer, 5 oz of wine, or 1 oz of hard liquor  Do not use alcohol or drugs to relax.  Eat a balanced diet that includes fresh fruits and vegetables, whole grains, lean meats, fish, eggs, and beans, and low-fat dairy. Avoid processed foods and foods high in added fat, sugar, and salt.  Exercise at least 30 minutes on 5 or more days each week.  Get 7-8 hours of sleep each night. General instructions   Practice stress management techniques as discussed with your health care provider.  Drink enough fluid to keep your urine clear or pale yellow.  Take over-the-counter and prescription medicines only as told by your health care provider.  Keep all follow-up visits as told by your health care provider. This is important. Contact a health care provider if:  Your symptoms get worse.  You have new symptoms.  You feel overwhelmed by your problems and can no longer manage them on your own. Get help right away if:  You have thoughts of hurting yourself or others. If you ever feel like you may hurt yourself or others, or have thoughts about taking your own life, get help right away. You can go to your nearest emergency department or call:  Your local emergency services (911 in the [place]  A suicide crisis helpline, such as the  [place] at (316) 250-6172. This is open 24 hours a day. Summary  Stress is a normal reaction to life events. It can cause problems if it happens too often or for too long.  Practicing stress management techniques is the best way to treat stress.  Counseling or talk therapy with a mental health professional may be helpful if you are having trouble managing stress on your own. This information is not intended to replace advice given to you by your health care provider. Make sure you discuss any questions you have with your health care provider. Document Revised: 07/24/2018 Document Reviewed: 02/14/2016 Elsevier Patient Education  [place].

## 2019-09-17 NOTE — Progress Notes (Signed)
Catherine Gilmore, Catherine Gilmore  [id number] Phone:  [phone]   Fax:  [phone]   Established Patient Office Visit  Subjective:  Patient ID: Catherine Gilmore, female    DOB: 11/06/1955  Age: 64 y.o. MRN: [id number]  CC:  Chief Complaint  Patient presents with  . Follow-up    HPI Catherine Gilmore presents for follow-up.  She  has a past medical history of Anxiety, Back pain, chronic, Flushing, GERD (gastroesophageal reflux disease), Heart palpitations (03/2019), History of gestational diabetes, History of sinus problem, History of sinus problem, Hyperlipidemia, Hypertension, Hypokalemia, Mitral valve prolapse, Polycythemia, and Supraventricular tachycardia, paroxysmal ([place]) (01/1998,02/1998).   Ear Pain Patient complains of ear pain . Symptoms include variety of symptoms; itchy, occasional pain or pressure. Onset of symptoms was several months ago, and have been unchanged since that time. Associated symptoms include: none. Patient denies: chills, congestion, fever , headache, post nasal drip, productive cough, sinus pressure, sneezing and sore throat. She is drinking plenty of fluids.  Hypertension Patient is here for follow-up of elevated blood pressure. She is not exercising and is adherent to a low-salt diet. Cardiac symptoms: chest pressure/discomfort. Patient denies exertional chest pressure/discomfort, irregular heart beat, lower extremity edema, palpitations and syncope. Cardiovascular risk factors: dyslipidemia, hypertension, sedentary lifestyle and smoking/ tobacco exposure. Use of agents associated with hypertension: none. History of target organ damage: none and SVT. mitral valve prolapse. Past Medical History:  Diagnosis Date  . Anxiety   . Back pain, chronic   . Flushing   . GERD (gastroesophageal reflux disease)   . Heart palpitations 03/2019  . History of gestational diabetes   . History of sinus problem   . History of sinus problem   .  Hyperlipidemia   . Hypertension   . Hypokalemia   . Mitral valve prolapse   . Polycythemia   . Supraventricular tachycardia, paroxysmal ([place]) 01/1998,02/1998   s/p ablation and cardioversion     Past Surgical History:  Procedure Laterality Date  . SVT ablation     x2    Family History  Problem Relation Age of Onset  . Cancer Mother   . Cirrhosis Father     Social History   Socioeconomic History  . Marital status: Legally Separated    Spouse name: Not on file  . Number of children: Not on file  . Years of education: Not on file  . Highest education level: Not on file  Occupational History  . Not on file  Tobacco Use  . Smoking status: Current Every Day Smoker    Packs/day: 1.00  . Smokeless tobacco: Never Used  Vaping Use  . Vaping Use: Never used  Substance and Sexual Activity  . Alcohol use: Yes    Comment: 3 to 4 times weekly   . Drug use: No  . Sexual activity: Yes  Other Topics Concern  . Not on file  Social History Narrative  . Not on file   Social Determinants of Health   Financial Resource Strain:   . Difficulty of Paying Living Expenses: Not on file  Food Insecurity:   . Worried About Charity fundraiser in the Last Year: Not on file  . Ran Out of Food in the Last Year: Not on file  Transportation Needs:   . Lack of Transportation (Medical): Not on file  . Lack of Transportation (Non-Medical): Not on file  Physical Activity:   . Days of Exercise per Week: Not on file  .  Minutes of Exercise per Session: Not on file  Stress:   . Feeling of Stress : Not on file  Social Connections:   . Frequency of Communication with Friends and Family: Not on file  . Frequency of Social Gatherings with Friends and Family: Not on file  . Attends Religious Services: Not on file  . Active Member of Clubs or Organizations: Not on file  . Attends Archivist Meetings: Not on file  . Marital Status: Not on file  Intimate Partner Violence:   . Fear of  Current or Ex-Partner: Not on file  . Emotionally Abused: Not on file  . Physically Abused: Not on file  . Sexually Abused: Not on file    Outpatient Medications Prior to Visit  Medication Sig Dispense Refill  . losartan (COZAAR) 50 MG tablet Take 1 tablet (50 mg total) by mouth daily. 30 tablet 11  . omeprazole (PRILOSEC) 20 MG capsule Take 1 capsule (20 mg total) by mouth daily. 30 capsule 2  . PARoxetine (PAXIL) 40 MG tablet TAKE 1 TABLET (40 MG TOTAL) BY MOUTH EVERY MORNING. 30 tablet 1  . pravastatin (PRAVACHOL) 20 MG tablet Take 1 tablet (20 mg total) by mouth daily. 90 tablet 1  . triamterene-hydrochlorothiazide (MAXZIDE-25) 37.5-25 MG tablet Take 1 tablet by mouth daily. 90 tablet 3  . cetirizine (ZYRTEC) 10 MG tablet Take 1 tablet (10 mg total) by mouth daily. (Patient not taking: Reported on 06/18/2019) 30 tablet 2  . fluticasone (FLONASE) 50 MCG/ACT nasal spray Place 1 spray into both nostrils daily. (Patient not taking: Reported on 06/18/2019) 16 g 2  . hydrOXYzine (ATARAX/VISTARIL) 10 MG tablet Take 1 tablet (10 mg total) by mouth 3 (three) times daily as needed. (Patient not taking: Reported on 06/18/2019) 90 tablet 0  . LORazepam (ATIVAN) 0.5 MG tablet Take 1 tablet (0.5 mg total) by mouth at bedtime. (Patient not taking: Reported on 06/18/2019) 30 tablet 0   No facility-administered medications prior to visit.    Allergies  Allergen Reactions  . Lisinopril     REACTION: Cough    ROS Review of Systems  Constitutional:       Decreased water  HENT: Positive for ear pain.   Eyes: Negative.   Respiratory:       Dyspnea on exertion  Cardiovascular: Positive for chest pain.  Gastrointestinal: Positive for diarrhea and nausea.       Occasional   Endocrine: Negative.   Genitourinary: Negative.   Skin: Negative.   Allergic/Immunologic: Negative.   Hematological: Negative.       Objective:    Physical Exam Constitutional:      General: She is not in acute  distress. HENT:     Head: Normocephalic.     Right Ear: Tympanic membrane normal.     Left Ear: Tympanic membrane normal.     Nose: Nose normal.     Mouth/Throat:     Mouth: Mucous membranes are moist.  Cardiovascular:     Rate and Rhythm: Normal rate and regular rhythm.     Pulses: Normal pulses.     Heart sounds: Normal heart sounds.  Pulmonary:     Effort: Pulmonary effort is normal.     Comments: Diminished Abdominal:     Palpations: Abdomen is soft.  Musculoskeletal:        General: Normal range of motion.     Cervical back: Normal range of motion.  Skin:    General: Skin is warm and dry.  Capillary Refill: Capillary refill takes less than 2 seconds.  Neurological:     General: No focal deficit present.     Mental Status: She is alert. She is disoriented.  Psychiatric:        Mood and Affect: Mood normal.     BP 119/79   Pulse 72   Temp (!) 97.4 F (36.3 C) (Temporal)   Ht 5\' 4"  (1.626 m)   Wt 156 lb (70.8 kg)   SpO2 99%   BMI 26.78 kg/m  Wt Readings from Last 3 Encounters:  09/17/19 156 lb (70.8 kg)  06/18/19 156 lb 9.6 oz (71 kg)  03/12/19 160 lb 9.6 oz (72.8 kg)     There are no preventive care reminders to display for this patient.  There are no preventive care reminders to display for this patient.  Lab Results  Component Value Date   TSH 2.660 03/12/2019   Lab Results  Component Value Date   WBC 8.4 03/12/2019   HGB 15.0 03/12/2019   HCT 42.3 03/12/2019   MCV 93 03/12/2019   PLT 331 03/12/2019   Lab Results  Component Value Date   NA 135 06/18/2019   K 3.7 06/18/2019   CO2 24 03/12/2019   GLUCOSE 116 (H) 06/18/2019   BUN 14 06/18/2019   CREATININE 0.81 06/18/2019   BILITOT 0.6 06/18/2019   ALKPHOS 64 06/18/2019   AST 23 06/18/2019   ALT 19 03/12/2019   PROT 6.7 06/18/2019   ALBUMIN 4.6 06/18/2019   CALCIUM 9.6 06/18/2019   Lab Results  Component Value Date   CHOL 216 (H) 06/18/2019   Lab Results  Component Value Date    HDL 103 06/18/2019   Lab Results  Component Value Date   LDLCALC 101 (H) 06/18/2019   Lab Results  Component Value Date   TRIG 67 06/18/2019   Lab Results  Component Value Date   CHOLHDL 2.1 06/18/2019   Lab Results  Component Value Date   HGBA1C 5.4 03/12/2019      Assessment & Plan:   Problem List Items Addressed This Visit      Cardiovascular and Mediastinum   Essential hypertension Encouraged on going compliance with current medication regimen Encouraged home monitoring and recording BP <130/80 Eating a heart-healthy diet with less salt Encouraged regular physical activity  Recommend Weight loss        Other   ANXIETY DEPRESSION - Primary Continue with current regimen.-Stable no desire to change treatment    Other Visit Diagnoses    Ear pressure, right     Possible referral to ENT if symptoms        No orders of the defined types were placed in this encounter.   Follow-up: Return in about 6 months (around 03/16/2020).    Vevelyn Francois, NP

## 2019-10-06 MED FILL — PRAVASTATIN SODIUM 20 MG TA: 20 | 30 days supply | Qty: 30 | Fill #0

## 2019-10-06 MED FILL — PARoxetine HCL 40 MG TABS: 40 | 30 days supply | Qty: 30 | Fill #1

## 2019-10-06 MED FILL — OMEPRAZOLE 20 MG CAP: 20 | 30 days supply | Qty: 30 | Fill #2

## 2019-10-06 MED FILL — TRIAMTERENE-HCTZ 37.5-25 MG: 37.5-25 | 30 days supply | Qty: 30 | Fill #3

## 2019-10-06 MED FILL — LOSARTAN POTASSIUM 50 MG TA: 50 | 30 days supply | Qty: 30 | Fill #3

## 2019-11-10 ENCOUNTER — Other Ambulatory Visit: Payer: Self-pay | Admitting: Nurse Practitioner

## 2019-11-10 ENCOUNTER — Encounter: Payer: Self-pay | Admitting: Nurse Practitioner

## 2019-11-10 ENCOUNTER — Telehealth (INDEPENDENT_AMBULATORY_CARE_PROVIDER_SITE_OTHER): Payer: Self-pay | Admitting: Nurse Practitioner

## 2019-11-10 VITALS — Ht 64.0 in | Wt 155.0 lb

## 2019-11-10 DIAGNOSIS — J029 Acute pharyngitis, unspecified: Secondary | ICD-10-CM

## 2019-11-10 MED ORDER — AZITHROMYCIN 250 MG PO TABS: ORAL_TABLET | ORAL | 0 refills | Status: DC

## 2019-11-10 MED FILL — AZITHROMYCIN 250 MG TABLET: 250 | 5 days supply | Qty: 6 | Fill #0

## 2019-11-10 NOTE — Progress Notes (Signed)
Virtual Visit via Telephone Note  I connected with Catherine Gilmore on 11/10/19 at  1:40 PM EDT by telephone and verified that I am speaking with the correct person using two identifiers.   I discussed the limitations, risks, security and privacy concerns of performing an evaluation and management service by telephone and the availability of in person appointments. I also discussed with the patient that there may be a patient responsible charge related to this service. The patient expressed understanding and agreed to proceed.  Patient home Provider Office  History of Present Illness: Sore throat, cold symptoms x 2 wks, swollen glands in hemoglobin . Upper Respiratory Infection Patient complains of symptoms of a URI. Symptoms include congestion and cough described as productive of green sputum. Onset of symptoms was 14 days ago, and has been 3 days ago getting better since that time. She then felt like the swollen gland occurred. She has not been using any OTC. She has tried the coricidin in the past that made her feel "werid". She is requesting an anbx.  She continues to smoke < 1 pack per day   Observations/Objective:   Assessment and Plan: Assessment  Primary Diagnosis & Pertinent Problem List: The encounter diagnosis was Acute pharyngitis, unspecified etiology.  Visit Diagnosis: 1. Acute pharyngitis, unspecified etiology  Encourage Chloraseptic spray salt water gargles if symptoms persist or get worse Z-Pak available.  Discussed the risk factors associated with smoking ; CAD, COPD, Cancer, PVD increased susceptibility to respiratory illnesses Discussed treatment options with cessation ie counseling, support resources and available medications  Choosing a quit day and setting goals accordingly. Discussed ways to quit; start by decreasing one cigarette per day or per week.   Encourage patient to call for assistance once ready to quit. Provided education handouts   Counseling 5-10  minutes      Follow Up Instructions:    I discussed the assessment and treatment plan with the patient. The patient was provided an opportunity to ask questions and all were answered. The patient agreed with the plan and demonstrated an understanding of the instructions.   The patient was advised to call back or seek an in-person evaluation if the symptoms worsen or if the condition fails to improve as anticipated.  I provided 11 minutes of non-face-to-face time during this encounter.   Vevelyn Francois, NP

## 2019-11-11 ENCOUNTER — Encounter: Payer: Self-pay | Admitting: Nurse Practitioner

## 2019-11-11 ENCOUNTER — Other Ambulatory Visit: Payer: Self-pay | Admitting: Nurse Practitioner

## 2019-11-11 DIAGNOSIS — F341 Dysthymic disorder: Secondary | ICD-10-CM

## 2019-11-11 DIAGNOSIS — K219 Gastro-esophageal reflux disease without esophagitis: Secondary | ICD-10-CM

## 2019-11-11 MED FILL — TRIAMTERENE-HCTZ 37.5-25 MG: 37.5-25 | 30 days supply | Qty: 30 | Fill #4

## 2019-11-11 MED FILL — LOSARTAN POTASSIUM 50 MG TA: 50 | 30 days supply | Qty: 30 | Fill #4

## 2019-11-11 MED FILL — PRAVASTATIN SODIUM 20 MG TA: 20 | 30 days supply | Qty: 30 | Fill #1

## 2019-11-12 ENCOUNTER — Other Ambulatory Visit: Payer: Self-pay | Admitting: Nurse Practitioner

## 2019-11-12 MED FILL — OMEPRAZOLE 20 MG CAP: 20 | 90 days supply | Qty: 90 | Fill #0

## 2019-11-12 MED FILL — PARoxetine HCL 40 MG TABS: 40 | 30 days supply | Qty: 30 | Fill #0

## 2019-11-12 NOTE — Telephone Encounter (Signed)
Med refill request

## 2019-12-21 MED FILL — LOSARTAN POTASSIUM 50 MG TA: 50 | 30 days supply | Qty: 30 | Fill #5

## 2019-12-21 MED FILL — TRIAMTERENE-HCTZ 37.5-25 MG: 37.5-25 | 30 days supply | Qty: 30 | Fill #5

## 2019-12-21 MED FILL — PARoxetine HCL 40 MG TABS: 40 | 30 days supply | Qty: 30 | Fill #1

## 2019-12-21 MED FILL — PRAVASTATIN SODIUM 20 MG TA: 20 | 30 days supply | Qty: 30 | Fill #2

## 2020-01-26 MED FILL — LOSARTAN POTASSIUM 50 MG TA: 50 | 30 days supply | Qty: 30 | Fill #6

## 2020-01-26 MED FILL — TRIAMTERENE-HCTZ 37.5-25 MG: 37.5-25 | 30 days supply | Qty: 30 | Fill #6

## 2020-01-26 MED FILL — PARoxetine HCL 40 MG TABS: 40 | 30 days supply | Qty: 30 | Fill #2

## 2020-01-26 MED FILL — PRAVASTATIN SODIUM 20 MG TA: 20 | 30 days supply | Qty: 30 | Fill #3

## 2020-02-23 MED FILL — PARoxetine HCL 40 MG TABS: 40 | 30 days supply | Qty: 30 | Fill #3

## 2020-02-23 MED FILL — LOSARTAN POTASSIUM 50 MG TA: 50 | 30 days supply | Qty: 30 | Fill #7

## 2020-02-23 MED FILL — OMEPRAZOLE 20 MG CAP: 20 | 30 days supply | Qty: 30 | Fill #1

## 2020-02-23 MED FILL — PRAVASTATIN SODIUM 20 MG TA: 20 | 30 days supply | Qty: 30 | Fill #4

## 2020-02-23 MED FILL — TRIAMTERENE-HCTZ 37.5-25 MG: 37.5-25 | 30 days supply | Qty: 30 | Fill #7

## 2020-03-10 ENCOUNTER — Telehealth: Payer: Self-pay | Admitting: Nurse Practitioner

## 2020-03-10 NOTE — Telephone Encounter (Signed)
She will need an appointment

## 2020-03-10 NOTE — Telephone Encounter (Addendum)
Pt called requesting medication called to pharmacy for lump under jaw line to ear after finishing z pak over a wk ago.  Scheduled pt for Monday 03/13/20 since pt has not been seen since 11/10/19.  Will call pt if appt needs canceled.

## 2020-03-13 ENCOUNTER — Other Ambulatory Visit: Payer: Self-pay

## 2020-03-13 ENCOUNTER — Other Ambulatory Visit: Payer: Self-pay | Admitting: Nurse Practitioner

## 2020-03-13 ENCOUNTER — Encounter: Payer: Self-pay | Admitting: Nurse Practitioner

## 2020-03-13 ENCOUNTER — Ambulatory Visit (INDEPENDENT_AMBULATORY_CARE_PROVIDER_SITE_OTHER): Payer: Self-pay | Admitting: Nurse Practitioner

## 2020-03-13 VITALS — BP 110/82 | HR 80 | Ht 64.0 in | Wt 163.0 lb

## 2020-03-13 DIAGNOSIS — R590 Localized enlarged lymph nodes: Secondary | ICD-10-CM

## 2020-03-13 DIAGNOSIS — D2230 Melanocytic nevi of unspecified part of face: Secondary | ICD-10-CM

## 2020-03-13 DIAGNOSIS — F419 Anxiety disorder, unspecified: Secondary | ICD-10-CM

## 2020-03-13 DIAGNOSIS — Z1231 Encounter for screening mammogram for malignant neoplasm of breast: Secondary | ICD-10-CM

## 2020-03-13 DIAGNOSIS — N644 Mastodynia: Secondary | ICD-10-CM

## 2020-03-13 MED ORDER — LORAZEPAM 0.5 MG PO TABS: 0.5000 mg | ORAL_TABLET | Freq: Every day | ORAL | 0 refills | Status: DC

## 2020-03-13 MED ORDER — AMOXICILLIN-POT CLAVULANATE 875-125 MG PO TABS: 1.0000 | ORAL_TABLET | Freq: Two times a day (BID) | ORAL | 0 refills | Status: DC

## 2020-03-13 MED FILL — AMOX-CLAV 875-125 MG TABLET: 875-125 | 10 days supply | Qty: 20 | Fill #0

## 2020-03-13 NOTE — Patient Instructions (Signed)
Epidermoid Cyst  An epidermoid cyst, also called an epidermal cyst, is a small lump under your skin. The cyst contains a substance called keratin. Do not try to pop or open the cyst yourself. What are the causes?  A blocked hair follicle.  A hair that curls and re-enters the skin instead of growing straight out of the skin.  A blocked pore.  Irritated skin.  An injury to the skin.  Certain conditions that are passed along from parent to child.  Human papillomavirus (HPV). This happens rarely when cysts occur on the bottom of the feet.  Long-term sun damage to the skin. What increases the risk?  Having acne.  Being female.  Having an injury to the skin.  Being past puberty.  Having certain conditions caused by genes (genetic disorder) What are the signs or symptoms? These cysts are usually harmless, but they can get infected. Symptoms of infection may include:  Redness.  Inflammation.  Tenderness.  Warmth.  Fever.  A bad-smelling substance that drains from the cyst.  Pus that drains from the cyst. How is this treated? In many cases, epidermoid cysts go away on their own without treatment. If a cyst becomes infected, treatment may include:  Opening and draining the cyst, done by a doctor. After draining, you may need minor surgery to remove the rest of the cyst.  Antibiotic medicine.  Shots of medicines (steroids) that help to reduce inflammation.  Surgery to remove the cyst. Surgery may be done if the cyst: ? Becomes large. ? Bothers you. ? Has a chance of turning into cancer.  Do not try to open a cyst yourself. Follow these instructions at home: Medicines  Take over-the-counter and prescription medicines as told by your doctor.  If you were prescribed an antibiotic medicine, take it as told by your doctor. Do not stop taking it even if you start to feel better. General instructions  Keep the area around your cyst clean and dry.  Wear loose, dry  clothing.  Avoid touching your cyst.  Check your cyst every day for signs of infection. Check for: ? Redness, swelling, or pain. ? Fluid or blood. ? Warmth. ? Pus or a bad smell.  Keep all follow-up visits. How is this prevented?  Wear clean, dry, clothing.  Avoid wearing tight clothing.  Keep your skin clean and dry. Take showers or baths every day. Contact a doctor if:  Your cyst has symptoms of infection.  Your condition does not improve or gets worse.  You have a cyst that looks different from other cysts you have had.  You have a fever. Get help right away if:  Redness spreads from the cyst into the area close by. Summary  An epidermoid cyst is a small lump under your skin.  If a cyst becomes infected, treatment may include surgery to open and drain the cyst, or to remove it.  Take over-the-counter and prescription medicines only as told by your doctor.  Contact a doctor if your condition is not improving or is getting worse.  Keep all follow-up visits. This information is not intended to replace advice given to you by your health care provider. Make sure you discuss any questions you have with your health care provider. Document Revised: 03/31/2019 Document Reviewed: 03/31/2019 Elsevier Patient Education  [place]. Lymphadenopathy  Lymphadenopathy means that your lymph glands are swollen or larger than normal. Lymph glands, also called lymph nodes, are collections of tissue that filter excess fluid, bacteria, viruses,  and waste from your bloodstream. They are part of your body's disease-fighting system (immune system), which protects your body from germs. There may be different causes of lymphadenopathy, depending on where it is in your body. Some types go away on their own. Lymphadenopathy can occur anywhere that you have lymph glands, including these areas:  Neck (cervical lymphadenopathy).  Chest (mediastinal lymphadenopathy).  Lungs (hilar  lymphadenopathy).  Underarms (axillary lymphadenopathy).  Groin (inguinal lymphadenopathy). When your immune system responds to germs, infection-fighting cells and fluid build up in your lymph glands. This causes some swelling and enlargement. If the lymph nodes do not go back to normal size after you have an infection or disease, your health care provider may do tests. These tests help to monitor your condition and find the reason why the glands are still swollen and enlarged. Follow these instructions at home:  Get plenty of rest.  Your health care provider may recommend over-the-counter medicines for pain. Take over-the-counter and prescription medicines only as told by your health care provider.  If directed, apply heat to swollen lymph glands as often as told by your health care provider. Use the heat source that your health care provider recommends, such as a moist heat pack or a heating pad. ? Place a towel between your skin and the heat source. ? Leave the heat on for 20-30 minutes. ? Remove the heat if your skin turns bright red. This is especially important if you are unable to feel pain, heat, or cold. You may have a greater risk of getting burned.  Check your affected lymph glands every day for changes. Check other lymph gland areas as told by your health care provider. Check for changes such as: ? More swelling. ? Sudden increase in size. ? Redness or pain. ? Hardness.  Keep all follow-up visits. This is important.   Contact a health care provider if you have:  Lymph glands that: ? Are still swollen after 2 weeks. ? Have suddenly gotten bigger or the swelling spreads. ? Are red, painful, or hard.  Fluid leaking from the skin near an enlarged lymph gland.  Problems with breathing.  A fever, chills, or night sweats.  Fatigue.  A sore throat.  Pain in your abdomen.  Weight loss. Get help right away if you have:  Severe pain.  Chest pain.  Shortness of  breath. These symptoms may represent a serious problem that is an emergency. Do not wait to see if the symptoms will go away. Get medical help right away. Call your local emergency services (911 in the [place] Do not drive yourself to the hospital. Summary  Lymphadenopathy means that your lymph glands are swollen or larger than normal.  Lymph glands, also called lymph nodes, are collections of tissue that filter excess fluid, bacteria, viruses, and waste from the bloodstream. They are part of your body's disease-fighting system (immune system).  Lymphadenopathy can occur anywhere that you have lymph glands.  If the lymph nodes do not go back to normal size after you have an infection or disease, your health care provider may do tests to monitor your condition and find the reason why the glands are still swollen and enlarged.  Check your affected lymph glands every day for changes. Check other lymph gland areas as told by your health care provider. This information is not intended to replace advice given to you by your health care provider. Make sure you discuss any questions you have with your health care provider. Document Revised:  10/20/2019 Document Reviewed: 10/20/2019 Elsevier Patient Education  [place].

## 2020-03-13 NOTE — Progress Notes (Signed)
[address], [place]  [address] Phone:  [phone]   Fax:  [phone]   Established Patient Office Visit  Subjective:  Patient ID: Catherine Gilmore, female    DOB: 1955-05-04  Age: 65 y.o. MRN: [id number]  CC:  Chief Complaint  Patient presents with  . Mass    Left side of neck below ear    HPI Catherine Gilmore presents for reevaluation. She was seen in the ER on 03/01/20 for dizziness. She was diagnosed with sinus infection. Antibiotics included Azithromycin. She admits that she mentioned the mass while there.  CT scan of head was negative. The patient reports that the mass has been present for 2 weeks. The onset of the mass was sudden. Associated symptoms were positive for tenderness with sleep a few nights ago.. There has not been a history of tender. The recent exposure history has been negative. She does continue to smoke but has a desire to quit in the near future.  Past Medical History:  Diagnosis Date  . Anxiety   . Back pain, chronic   . Flushing   . GERD (gastroesophageal reflux disease)   . Heart palpitations 03/2019  . History of gestational diabetes   . History of sinus problem   . History of sinus problem   . Hyperlipidemia   . Hypertension   . Hypokalemia   . Mitral valve prolapse   . Polycythemia   . Supraventricular tachycardia, paroxysmal ([place]) 01/1998,02/1998   s/p ablation and cardioversion     Past Surgical History:  Procedure Laterality Date  . SVT ablation     x2    Family History  Problem Relation Age of Onset  . Cancer Mother   . Cirrhosis Father     Social History   Socioeconomic History  . Marital status: Legally Separated    Spouse name: Not on file  . Number of children: Not on file  . Years of education: Not on file  . Highest education level: Not on file  Occupational History  . Not on file  Tobacco Use  . Smoking status: Current Every Day Smoker    Packs/day: 1.00    Start date:  04/07/2005  . Smokeless tobacco: Never Used  Vaping Use  . Vaping Use: Never used  Substance and Sexual Activity  . Alcohol use: Yes    Comment: 3 to 4 times weekly   . Drug use: No  . Sexual activity: Yes  Other Topics Concern  . Not on file  Social History Narrative  . Not on file   Social Determinants of Health   Financial Resource Strain: Not on file  Food Insecurity: Not on file  Transportation Needs: Not on file  Physical Activity: Not on file  Stress: Not on file  Social Connections: Not on file  Intimate Partner Violence: Not on file    Outpatient Medications Prior to Visit  Medication Sig Dispense Refill  . aspirin 325 MG tablet Take 325 mg by mouth every 4 (four) hours as needed.    Marland Kitchen losartan (COZAAR) 50 MG tablet Take 1 tablet (50 mg total) by mouth daily. 30 tablet 11  . omeprazole (PRILOSEC) 20 MG capsule TAKE 1 CAPSULE (20 MG TOTAL) BY MOUTH DAILY. 90 capsule 3  . PARoxetine (PAXIL) 40 MG tablet TAKE 1 TABLET (40 MG TOTAL) BY MOUTH EVERY MORNING. 90 tablet 3  . pravastatin (PRAVACHOL) 20 MG tablet Take 1 tablet (20 mg total) by mouth  daily. 90 tablet 1  . triamterene-hydrochlorothiazide (MAXZIDE-25) 37.5-25 MG tablet Take 1 tablet by mouth daily. 90 tablet 3  . LORazepam (ATIVAN) 0.5 MG tablet Take 1 tablet (0.5 mg total) by mouth at bedtime. 30 tablet 0  . azithromycin (ZITHROMAX) 250 MG tablet 2 tab daily x 1 and then daily x 4 days 6 tablet 0  . cetirizine (ZYRTEC) 10 MG tablet Take 1 tablet (10 mg total) by mouth daily. (Patient not taking: Reported on 06/18/2019) 30 tablet 2  . fluticasone (FLONASE) 50 MCG/ACT nasal spray Place 1 spray into both nostrils daily. (Patient not taking: Reported on 06/18/2019) 16 g 2  . hydrOXYzine (ATARAX/VISTARIL) 10 MG tablet Take 1 tablet (10 mg total) by mouth 3 (three) times daily as needed. (Patient not taking: Reported on 06/18/2019) 90 tablet 0   No facility-administered medications prior to visit.    Allergies  Allergen  Reactions  . Lisinopril     REACTION: Cough    ROS Review of Systems  Constitutional: Negative for activity change, fatigue and unexpected weight change.  HENT: Positive for ear pain (ocassional ) and postnasal drip. Negative for sinus pressure and trouble swallowing.   Respiratory: Negative for shortness of breath.   Cardiovascular: Negative for chest pain.  Gastrointestinal: Positive for nausea (x 1).  Skin:       Left facial mole  Neurological: Negative for dizziness and light-headedness.      Objective:    Physical Exam Constitutional:      General: She is not in acute distress.    Appearance: She is not ill-appearing, toxic-appearing or diaphoretic.  HENT:     Head: Normocephalic and atraumatic.     Nose: Nose normal.     Mouth/Throat:     Mouth: Mucous membranes are moist.  Neck:     Comments: Submandibular nodule Cardiovascular:     Rate and Rhythm: Normal rate and regular rhythm.     Pulses: Normal pulses.     Heart sounds: Normal heart sounds.  Pulmonary:     Effort: Pulmonary effort is normal.     Breath sounds: Normal breath sounds.  Abdominal:     Palpations: Abdomen is soft.  Musculoskeletal:     Cervical back: No rigidity or tenderness.  Skin:    General: Skin is warm and dry.     Capillary Refill: Capillary refill takes less than 2 seconds.  Neurological:     General: No focal deficit present.     Mental Status: She is alert and oriented to person, place, and time.  Psychiatric:        Mood and Affect: Mood normal.        Behavior: Behavior normal.        Thought Content: Thought content normal.        Judgment: Judgment normal.     BP 110/82   Pulse 80   Ht 5\' 4"  (1.626 m)   Wt 163 lb (73.9 kg)   SpO2 99%   BMI 27.98 kg/m  Wt Readings from Last 3 Encounters:  03/13/20 163 lb (73.9 kg)  11/10/19 155 lb (70.3 kg)  09/17/19 156 lb (70.8 kg)     Health Maintenance Due  Topic Date Due  . COVID-19 Vaccine (1) Never done  . PAP  SMEAR-Modifier  06/15/2018    There are no preventive care reminders to display for this patient.  Lab Results  Component Value Date   TSH 2.660 03/12/2019   Lab Results  Component Value  Date   WBC 8.4 03/12/2019   HGB 15.0 03/12/2019   HCT 42.3 03/12/2019   MCV 93 03/12/2019   PLT 331 03/12/2019   Lab Results  Component Value Date   NA 135 06/18/2019   K 3.7 06/18/2019   CO2 24 03/12/2019   GLUCOSE 116 (H) 06/18/2019   BUN 14 06/18/2019   CREATININE 0.81 06/18/2019   BILITOT 0.6 06/18/2019   ALKPHOS 64 06/18/2019   AST 23 06/18/2019   ALT 19 03/12/2019   PROT 6.7 06/18/2019   ALBUMIN 4.6 06/18/2019   CALCIUM 9.6 06/18/2019   Lab Results  Component Value Date   CHOL 216 (H) 06/18/2019   Lab Results  Component Value Date   HDL 103 06/18/2019   Lab Results  Component Value Date   LDLCALC 101 (H) 06/18/2019   Lab Results  Component Value Date   TRIG 67 06/18/2019   Lab Results  Component Value Date   CHOLHDL 2.1 06/18/2019   Lab Results  Component Value Date   HGBA1C 5.4 03/12/2019      Assessment & Plan:   Problem List Items Addressed This Visit      Other   Anxiety Refilled x1 due to current fears   Relevant Medications   LORazepam (ATIVAN) 0.5 MG tablet    Other Visit Diagnoses    Change in facial mole      Relevant Orders   Ambulatory referral to Dermatology   Breast tenderness in female       Relevant Orders   Ambulatory Referral to [facility] for breast cancer       Submandibular lymphadenopathy     Augmentin 875 mg #20 BID  Possible epithelial cyst however will continue to monitor  Agreed with future plan of smoking cessation; to call for patches.    Relevant Orders   Ambulatory referral to ENT      Meds ordered this encounter  Medications  . amoxicillin-clavulanate (AUGMENTIN) 875-125 MG tablet    Sig: Take 1 tablet by mouth 2 (two) times daily for 10 days.    Dispense:  20 tablet    Refill:  0    Order  Specific Question:   Supervising Provider    Answer:   Tresa Garter [id number]  . LORazepam (ATIVAN) 0.5 MG tablet    Sig: Take 1 tablet (0.5 mg total) by mouth at bedtime.    Dispense:  30 tablet    Refill:  0    **One time fill of this medication only**    Order Specific Question:   Supervising Provider    Answer:   Tresa Garter [[id number]]    Follow-up: No follow-ups on file.    Vevelyn Francois, NP Vevelyn Francois, NP

## 2020-03-16 ENCOUNTER — Telehealth (INDEPENDENT_AMBULATORY_CARE_PROVIDER_SITE_OTHER): Payer: Self-pay | Admitting: Nurse Practitioner

## 2020-03-16 NOTE — Progress Notes (Signed)
duplicate Review of Systems      Physical Exam

## 2020-03-30 MED FILL — OMEPRAZOLE 20 MG CAP: 20 | 30 days supply | Qty: 30 | Fill #2

## 2020-03-30 MED FILL — TRIAMTERENE-HCTZ 37.5-25 MG: 37.5-25 | 30 days supply | Qty: 30 | Fill #8

## 2020-03-30 MED FILL — PARoxetine HCL 40 MG TABS: 40 | 30 days supply | Qty: 30 | Fill #4

## 2020-03-30 MED FILL — PRAVASTATIN SODIUM 20 MG TA: 20 | 30 days supply | Qty: 30 | Fill #5

## 2020-03-30 MED FILL — LOSARTAN POTASSIUM 50 MG TA: 50 | 30 days supply | Qty: 30 | Fill #8

## 2020-04-07 HISTORY — PX: OTHER SURGICAL HISTORY: SHX169

## 2020-04-13 ENCOUNTER — Other Ambulatory Visit: Payer: Self-pay | Admitting: Nurse Practitioner

## 2020-04-13 ENCOUNTER — Other Ambulatory Visit: Payer: Self-pay

## 2020-04-13 ENCOUNTER — Telehealth: Payer: Self-pay

## 2020-04-13 DIAGNOSIS — E785 Hyperlipidemia, unspecified: Secondary | ICD-10-CM

## 2020-04-13 DIAGNOSIS — D2230 Melanocytic nevi of unspecified part of face: Secondary | ICD-10-CM

## 2020-04-13 DIAGNOSIS — I10 Essential (primary) hypertension: Secondary | ICD-10-CM

## 2020-04-13 DIAGNOSIS — F341 Dysthymic disorder: Secondary | ICD-10-CM

## 2020-04-13 DIAGNOSIS — K219 Gastro-esophageal reflux disease without esophagitis: Secondary | ICD-10-CM

## 2020-04-13 DIAGNOSIS — L821 Other seborrheic keratosis: Secondary | ICD-10-CM

## 2020-04-13 DIAGNOSIS — F419 Anxiety disorder, unspecified: Secondary | ICD-10-CM

## 2020-04-13 MED ORDER — PRAVASTATIN SODIUM 20 MG PO TABS
ORAL_TABLET | Freq: Every day | ORAL | 3 refills | Status: DC
  Filled 2020-04-13 – 2020-05-04 (×2): qty 30, 30d supply, fill #0
  Filled 2020-06-07: qty 30, 30d supply, fill #1
  Filled 2020-07-13: qty 30, 30d supply, fill #2
  Filled 2020-08-15: qty 30, 30d supply, fill #3

## 2020-04-13 NOTE — Telephone Encounter (Signed)
Please sent pt to this  Derm referral [facility]. [phone]

## 2020-04-13 NOTE — Telephone Encounter (Signed)
New referral placed.

## 2020-04-24 ENCOUNTER — Other Ambulatory Visit: Payer: Self-pay

## 2020-05-04 ENCOUNTER — Other Ambulatory Visit: Payer: Self-pay

## 2020-05-04 MED FILL — Omeprazole Cap Delayed Release 20 MG: ORAL | 30 days supply | Qty: 30 | Fill #0 | Status: AC

## 2020-05-04 MED FILL — Losartan Potassium Tab 50 MG: ORAL | 30 days supply | Qty: 30 | Fill #0 | Status: AC

## 2020-05-04 MED FILL — Triamterene & Hydrochlorothiazide Tab 37.5-25 MG: ORAL | 30 days supply | Qty: 30 | Fill #0 | Status: AC

## 2020-05-04 MED FILL — Paroxetine HCl Tab 40 MG: ORAL | 30 days supply | Qty: 30 | Fill #0 | Status: AC

## 2020-06-07 ENCOUNTER — Other Ambulatory Visit: Payer: Self-pay

## 2020-06-07 ENCOUNTER — Telehealth: Payer: Self-pay

## 2020-06-07 MED FILL — Omeprazole Cap Delayed Release 20 MG: ORAL | 30 days supply | Qty: 30 | Fill #1 | Status: AC

## 2020-06-07 MED FILL — Paroxetine HCl Tab 40 MG: ORAL | 30 days supply | Qty: 30 | Fill #1 | Status: AC

## 2020-06-07 MED FILL — Losartan Potassium Tab 50 MG: ORAL | 30 days supply | Qty: 30 | Fill #1 | Status: AC

## 2020-06-07 NOTE — Telephone Encounter (Signed)
Please fax [employer] [facility] Fax [phone]

## 2020-06-07 NOTE — Telephone Encounter (Signed)
Documents faxed

## 2020-06-08 ENCOUNTER — Other Ambulatory Visit: Payer: Self-pay

## 2020-07-13 ENCOUNTER — Other Ambulatory Visit: Payer: Self-pay

## 2020-07-13 ENCOUNTER — Other Ambulatory Visit: Payer: Self-pay | Admitting: Nurse Practitioner

## 2020-07-13 DIAGNOSIS — I10 Essential (primary) hypertension: Secondary | ICD-10-CM

## 2020-07-13 MED ORDER — TRIAMTERENE-HCTZ 37.5-25 MG PO TABS
1.0000 | ORAL_TABLET | Freq: Every day | ORAL | 3 refills | Status: DC
  Filled 2020-07-13: qty 90, 90d supply, fill #0
  Filled 2020-08-15: qty 90, 90d supply, fill #1

## 2020-07-13 MED ORDER — LOSARTAN POTASSIUM 50 MG PO TABS
ORAL_TABLET | Freq: Every day | ORAL | 11 refills | Status: DC
  Filled 2020-07-13: qty 30, 30d supply, fill #0
  Filled 2020-08-15: qty 30, 30d supply, fill #1

## 2020-07-13 MED FILL — Paroxetine HCl Tab 40 MG: ORAL | 30 days supply | Qty: 30 | Fill #2 | Status: AC

## 2020-07-13 MED FILL — Omeprazole Cap Delayed Release 20 MG: ORAL | 30 days supply | Qty: 30 | Fill #2 | Status: AC

## 2020-07-14 ENCOUNTER — Other Ambulatory Visit: Payer: Self-pay

## 2020-07-24 ENCOUNTER — Other Ambulatory Visit: Payer: Self-pay

## 2020-08-15 ENCOUNTER — Other Ambulatory Visit: Payer: Self-pay

## 2020-08-15 MED FILL — Paroxetine HCl Tab 40 MG: ORAL | 30 days supply | Qty: 30 | Fill #3 | Status: CN

## 2020-08-15 MED FILL — Omeprazole Cap Delayed Release 20 MG: ORAL | 30 days supply | Qty: 30 | Fill #3 | Status: CN

## 2020-08-16 ENCOUNTER — Other Ambulatory Visit: Payer: Self-pay

## 2020-08-17 ENCOUNTER — Other Ambulatory Visit: Payer: Self-pay

## 2020-08-18 ENCOUNTER — Other Ambulatory Visit: Payer: Self-pay

## 2020-08-21 ENCOUNTER — Other Ambulatory Visit (HOSPITAL_COMMUNITY): Payer: Self-pay

## 2020-09-11 ENCOUNTER — Emergency Department (HOSPITAL_COMMUNITY): Payer: Medicare HMO

## 2020-09-11 ENCOUNTER — Emergency Department (HOSPITAL_COMMUNITY)
Admission: EM | Admit: 2020-09-11 | Discharge: 2020-09-11 | Disposition: A | Discharge status: Home / Self Care | Payer: Medicare HMO | Attending: Emergency Medicine | Admitting: Emergency Medicine

## 2020-09-11 ENCOUNTER — Encounter (HOSPITAL_COMMUNITY): Payer: Self-pay | Admitting: Emergency Medicine

## 2020-09-11 ENCOUNTER — Other Ambulatory Visit: Payer: Self-pay

## 2020-09-11 DIAGNOSIS — F17210 Nicotine dependence, cigarettes, uncomplicated: Secondary | ICD-10-CM | POA: Insufficient documentation

## 2020-09-11 DIAGNOSIS — I10 Essential (primary) hypertension: Secondary | ICD-10-CM | POA: Insufficient documentation

## 2020-09-11 DIAGNOSIS — Z7982 Long term (current) use of aspirin: Secondary | ICD-10-CM | POA: Diagnosis not present

## 2020-09-11 DIAGNOSIS — F410 Panic disorder [episodic paroxysmal anxiety] without agoraphobia: Secondary | ICD-10-CM

## 2020-09-11 DIAGNOSIS — Z79899 Other long term (current) drug therapy: Secondary | ICD-10-CM | POA: Diagnosis not present
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
## 2020-09-11 NOTE — ED Triage Notes (Signed)
Pt BIBA  Per EMS pt reports SOB secondary to panic attack. Pt took 2 0.5 mg tablets of Ativan before EMS arrived to help herself calm down. Upon arrival pt began to calm down, pt was then placed on [facility] for comfort.    BP 128/82 CBG 158  98% 2LNC 20 G right AC

## 2020-09-11 NOTE — ED Notes (Signed)
EKG Stored, awaiting order for export.

## 2020-09-11 NOTE — ED Notes (Signed)
Pt ambulatory with minimal assistance.

## 2020-09-11 NOTE — ED Notes (Signed)
Pt discharged. Instructions given. AAOX4. Pt in no apparent distress or pain. The opportunity to ask questions was provided.  

## 2020-09-11 NOTE — Discharge Instructions (Addendum)
You are diagnosed with anxiety or panic attack today.  This condition can lead to hyperventilating, problems breathing, and other medical concerns.  Thankfully your work-up in the emergency department is reassuring.  However I do think that you do need to see a therapist for your stress and anxiety.  Please call the number above to schedule a new appointment with a therapist at the next convenience.  I think you would strongly benefit from having some time away from the house if possible.  Please reach out to your family and friends to see if they can help take care of your father-in-law for a few days if possible, to give you a break to get out of the house.

## 2020-09-14 ENCOUNTER — Other Ambulatory Visit: Payer: Self-pay | Admitting: Nurse Practitioner

## 2020-09-14 DIAGNOSIS — F419 Anxiety disorder, unspecified: Secondary | ICD-10-CM

## 2020-09-25 ENCOUNTER — Other Ambulatory Visit (HOSPITAL_COMMUNITY): Payer: Self-pay

## 2020-09-25 ENCOUNTER — Ambulatory Visit (INDEPENDENT_AMBULATORY_CARE_PROVIDER_SITE_OTHER): Payer: Medicare HMO | Admitting: Nurse Practitioner

## 2020-09-25 ENCOUNTER — Encounter: Payer: Self-pay | Admitting: Nurse Practitioner

## 2020-09-25 ENCOUNTER — Other Ambulatory Visit: Payer: Self-pay

## 2020-09-25 VITALS — BP 122/80 | HR 82 | Temp 97.1°F | Ht 64.0 in | Wt 167.0 lb

## 2020-09-25 DIAGNOSIS — I059 Rheumatic mitral valve disease, unspecified: Secondary | ICD-10-CM

## 2020-09-25 DIAGNOSIS — G4700 Insomnia, unspecified: Secondary | ICD-10-CM

## 2020-09-25 DIAGNOSIS — Z636 Dependent relative needing care at home: Secondary | ICD-10-CM

## 2020-09-25 DIAGNOSIS — D751 Secondary polycythemia: Secondary | ICD-10-CM

## 2020-09-25 DIAGNOSIS — M5442 Lumbago with sciatica, left side: Secondary | ICD-10-CM

## 2020-09-25 DIAGNOSIS — F17200 Nicotine dependence, unspecified, uncomplicated: Secondary | ICD-10-CM

## 2020-09-25 DIAGNOSIS — F419 Anxiety disorder, unspecified: Secondary | ICD-10-CM | POA: Diagnosis not present

## 2020-09-25 DIAGNOSIS — K219 Gastro-esophageal reflux disease without esophagitis: Secondary | ICD-10-CM | POA: Diagnosis not present

## 2020-09-25 DIAGNOSIS — I10 Essential (primary) hypertension: Secondary | ICD-10-CM

## 2020-09-25 DIAGNOSIS — M5441 Lumbago with sciatica, right side: Secondary | ICD-10-CM

## 2020-09-25 DIAGNOSIS — E785 Hyperlipidemia, unspecified: Secondary | ICD-10-CM

## 2020-09-25 DIAGNOSIS — G8929 Other chronic pain: Secondary | ICD-10-CM

## 2020-09-25 MED ORDER — DULOXETINE HCL 30 MG PO CPEP: 60.0000 mg | ORAL_CAPSULE | Freq: Every day | ORAL | 0 refills | Status: DC

## 2020-09-25 MED ORDER — LORAZEPAM 0.5 MG PO TABS: 0.5000 mg | ORAL_TABLET | Freq: Every day | ORAL | 0 refills | Status: DC

## 2020-09-25 NOTE — Progress Notes (Signed)
Careteam: Patient Care Team: [name], NP as PCP - General (Geriatric Medicine)  PLACE OF SERVICE:  [place] Directive information Does Patient Have a Medical Advance Directive?: No, Would patient like information on creating a medical advance directive?: No - Patient declined  Allergies  Allergen Reactions   Amoxicillin     Causes a bad yeast infection    Lisinopril     REACTION: Cough    Chief Complaint  Patient presents with   Establish Care    New patient establish care. New patient packet not received prior to appointment. Patient is experiencing caregiver burnout, takes care of father-in-law who is also a patient here. Patient c/o elevated stress level. Patient with trouble falling and staying asleep. Patient had lorazepam and maxide pill bottles present at initial appointment. Patient would like to hold off on any vaccine recommendations at this time.      HPI: Patient is a 65 y.o. female to establish care.   Anxiety- severe anxiety, takes lorazepam 0.5 mg by mouth daily as needed- does not use daily- 1 bottle generally last her a year but having to use more due to current situation of being a caregiver and feeling overwhelmed. Also taking paxil 40 mg by mouth daily- does not feel like this is effective. Has had anxiety/depression since she has been in her 62s. She was on paxil 20 mg for years and then was increase to $RemoveBef'40mg'NvZWILsTnv$ - has been on this dose for years.  Home situation makes anxiety/depression worse  Anxiety has significantly increased. She does have some depression.  Currently does not have a therapist.  Martin Majestic to the ED due to panic attack.  Trying to find a therapist in network.  Needs help with her father-in-law. Tried to talk to her husband about it but he wont help her.   Hypertension- taking losartan 50 mg by mouth daily and triamterene-hctz daily controlled.   GERD- controlled on omeprazole.   Hyperlipidemia- continues on pravastatin  20 mg by mouth daily, no recent blood work  Polycythemia- on hx was not aware of this.   MVP- due for follow up with cardiologist, hx of SVT and s/p ablation.   Review of Systems:  Review of Systems  Constitutional:  Negative for chills, fever and weight loss.  HENT:  Negative for tinnitus.   Respiratory:  Positive for shortness of breath (with activity). Negative for cough and sputum production.   Cardiovascular:  Negative for chest pain, palpitations and leg swelling.  Gastrointestinal:  Negative for abdominal pain, constipation, diarrhea and heartburn.  Genitourinary:  Negative for dysuria, frequency and urgency.  Musculoskeletal:  Positive for back pain. Negative for falls, joint pain and myalgias.  Skin: Negative.   Neurological:  Negative for dizziness and headaches.  Psychiatric/Behavioral:  Positive for depression. Negative for memory loss. The patient is nervous/anxious and has insomnia.    Past Medical History:  Diagnosis Date   Anxiety    Back pain, chronic    Flushing    GERD (gastroesophageal reflux disease)    Heart palpitations 03/2019   History of gestational diabetes    History of sinus problem    History of sinus problem    Hyperlipidemia    Hypertension    Hypokalemia    Mitral valve prolapse    Polycythemia    Supraventricular tachycardia, paroxysmal ([place]) 01/1998,02/1998   s/p ablation and cardioversion    Past Surgical History:  Procedure Laterality Date   SVT ablation  x2   Social History:   reports that she has been smoking cigarettes. She started smoking about 15 years ago. She has been smoking an average of 1 pack per day. She has never used smokeless tobacco. She reports current alcohol use. She reports that she does not use drugs.  Family History  Problem Relation Age of Onset   Cancer Mother    Cirrhosis Father     Medications: Patient's Medications  New Prescriptions   No medications on file  Previous Medications   ASPIRIN 325 MG  TABLET    Take 325 mg by mouth every 4 (four) hours as needed.   LORAZEPAM (ATIVAN) 0.5 MG TABLET    Take 1 tablet (0.5 mg total) by mouth at bedtime.   LOSARTAN (COZAAR) 50 MG TABLET    Take 1 tablet by mouth daily.   OMEPRAZOLE (PRILOSEC) 20 MG CAPSULE    TAKE 1 CAPSULE (20 MG TOTAL) BY MOUTH DAILY.   PAROXETINE (PAXIL) 40 MG TABLET    TAKE 1 TABLET (40 MG TOTAL) BY MOUTH EVERY MORNING.   PRAVASTATIN (PRAVACHOL) 20 MG TABLET    TAKE 1 TABLET (20 MG TOTAL) BY MOUTH DAILY.   TRIAMTERENE-HYDROCHLOROTHIAZIDE (MAXZIDE-25) 37.5-25 MG TABLET    Take 1 tablet by mouth daily.  Modified Medications   No medications on file  Discontinued Medications   No medications on file    Physical Exam:  Vitals:   09/25/20 1015  BP: 122/80  Pulse: 82  Temp: (!) 97.1 F (36.2 C)  TempSrc: Temporal  SpO2: 97%  Weight: 167 lb (75.8 kg)  Height: $Remove'5\' 4"'LxyqEEE$  (1.626 m)   Body mass index is 28.67 kg/m. Wt Readings from Last 3 Encounters:  09/25/20 167 lb (75.8 kg)  09/11/20 162 lb (73.5 kg)  03/13/20 163 lb (73.9 kg)    Physical Exam Constitutional:      General: She is not in acute distress.    Appearance: She is well-developed. She is not diaphoretic.  HENT:     Head: Normocephalic and atraumatic.     Mouth/Throat:     Pharynx: No oropharyngeal exudate.  Eyes:     Conjunctiva/sclera: Conjunctivae normal.     Pupils: Pupils are equal, round, and reactive to light.  Cardiovascular:     Rate and Rhythm: Normal rate and regular rhythm.     Heart sounds: Normal heart sounds.  Pulmonary:     Effort: Pulmonary effort is normal.     Breath sounds: Normal breath sounds.  Abdominal:     General: Bowel sounds are normal.     Palpations: Abdomen is soft.  Musculoskeletal:     Cervical back: Normal range of motion and neck supple.     Right lower leg: No edema.     Left lower leg: No edema.  Skin:    General: Skin is warm and dry.  Neurological:     Mental Status: She is alert.  Psychiatric:         Mood and Affect: Mood normal.    Labs reviewed: Basic Metabolic Panel: No results for input(s): NA, K, CL, CO2, GLUCOSE, BUN, CREATININE, CALCIUM, MG, PHOS, TSH in the last 8760 hours. Liver Function Tests: No results for input(s): AST, ALT, ALKPHOS, BILITOT, PROT, ALBUMIN in the last 8760 hours. No results for input(s): LIPASE, AMYLASE in the last 8760 hours. No results for input(s): AMMONIA in the last 8760 hours. CBC: No results for input(s): WBC, NEUTROABS, HGB, HCT, MCV, PLT in the last 8760 hours.  Lipid Panel: No results for input(s): CHOL, HDL, LDLCALC, TRIG, CHOLHDL, LDLDIRECT in the last 8760 hours. TSH: No results for input(s): TSH in the last 8760 hours. A1C: Lab Results  Component Value Date   HGBA1C 5.4 03/12/2019     Assessment/Plan 1. Anxiety Ongoing, she has stress at home. She is a caregiver.  -she plans to meet with therapist (recommend CBT) -will stop paxil and have her start cymbalta to start 30 mg by mouth daily for 1 week then increase to 2 tablets.  - LORazepam (ATIVAN) 0.5 MG tablet; Take 1 tablet (0.5 mg total) by mouth daily.  Dispense: 30 tablet; Refill: 0 - DULoxetine (CYMBALTA) 30 MG capsule; Take 2 capsules (60 mg total) by mouth daily.  Dispense: 60 capsule; Refill: 0 - TSH  2. Essential hypertension --stable. Goal bp <140/90. Continue on losartan, triamterene-hctz  with low sodium diet.  - Lipid panel - CMP with eGFR([facility]) - CBC with Differential/Platelet  3. Gastroesophageal reflux disease, unspecified whether esophagitis present -stable, continues on omeprazole.   4. Hyperlipidemia LDL goal <100 -continues on pravastatin, dietary modifications encouraged.  - Lipid panel - CMP with eGFR([facility])  5. Mitral valve disease - Ambulatory referral to Cardiology  6. POLYCYTHEMIA -will follow up lab work.  - CBC with Differential/Platelet  7. TOBACCO ABUSE -encourage cessation.   8. Chronic bilateral low back pain with bilateral  sciatica Ongoing, hopefully cymbalta will help with symptoms  9. Insomnia, unspecified type -ongoing, information provided on sleep hygiene   Next appt: 5 weeks on mood. Carlos American. [place], [facility] [phone]

## 2020-09-25 NOTE — Patient Instructions (Addendum)
Stop paxil stat cymtalta 30 mg by mouth daily for 1 week then increase to 60 mg daily   To schedule first AWV (in person)   Follow up in 5-6 weeks (can be virtual) on mood.   Can use melatonin 1 mg by mouth at bedtime.   Try to STOP smoking at night and before bed.

## 2020-09-26 LAB — COMPLETE METABOLIC PANEL WITH GFR
AG Ratio: 1.9 (calc) (ref 1.0–2.5)
ALT: 17 U/L (ref 6–29)
AST: 16 U/L (ref 10–35)
Albumin: 4.3 g/dL (ref 3.6–5.1)
Alkaline phosphatase (APISO): 66 U/L (ref 37–153)
BUN: 10 mg/dL (ref 7–25)
CO2: 28 mmol/L (ref 20–32)
Calcium: 9.3 mg/dL (ref 8.6–10.4)
Chloride: 101 mmol/L (ref 98–110)
Creat: 0.66 mg/dL (ref 0.50–1.05)
Globulin: 2.3 g/dL (calc) (ref 1.9–3.7)
Glucose, Bld: 111 mg/dL (ref 65–139)
Potassium: 4.1 mmol/L (ref 3.5–5.3)
Sodium: 136 mmol/L (ref 135–146)
Total Bilirubin: 0.5 mg/dL (ref 0.2–1.2)
Total Protein: 6.6 g/dL (ref 6.1–8.1)
eGFR: 97 mL/min/{1.73_m2} (ref >=60)

## 2020-09-26 LAB — LIPID PANEL
Cholesterol: 203 mg/dL — ABNORMAL HIGH (ref <200)
HDL: 71 mg/dL (ref >=50)
LDL Cholesterol (Calc): 106 mg/dL (calc) — ABNORMAL HIGH
Non-HDL Cholesterol (Calc): 132 mg/dL (calc) — ABNORMAL HIGH (ref <130)
Total CHOL/HDL Ratio: 2.9 (calc) (ref <5.0)
Triglycerides: 144 mg/dL (ref <150)

## 2020-09-26 LAB — CBC WITH DIFFERENTIAL/PLATELET
Absolute Monocytes: 395 cells/uL (ref 200–950)
Basophils Absolute: 42 cells/uL (ref 0–200)
Basophils Relative: 0.4 %
Eosinophils Absolute: 270 cells/uL (ref 15–500)
Eosinophils Relative: 2.6 %
HCT: 43.3 % (ref 35.0–45.0)
Hemoglobin: 14.9 g/dL (ref 11.7–15.5)
Lymphs Abs: 2153 cells/uL (ref 850–3900)
MCH: 30.7 pg (ref 27.0–33.0)
MCHC: 34.4 g/dL (ref 32.0–36.0)
MCV: 89.3 fL (ref 80.0–100.0)
MPV: 8.9 fL (ref 7.5–12.5)
Monocytes Relative: 3.8 %
Neutro Abs: 7540 cells/uL (ref 1500–7800)
Neutrophils Relative %: 72.5 %
Platelets: 373 10*3/uL (ref 140–400)
RBC: 4.85 10*6/uL (ref 3.80–5.10)
RDW: 13.1 % (ref 11.0–15.0)
Total Lymphocyte: 20.7 %
WBC: 10.4 10*3/uL (ref 3.8–10.8)

## 2020-09-26 LAB — TSH: TSH: 2.56 mIU/L (ref 0.40–4.50)

## 2020-10-16 ENCOUNTER — Other Ambulatory Visit: Payer: Self-pay

## 2020-10-16 ENCOUNTER — Ambulatory Visit (INDEPENDENT_AMBULATORY_CARE_PROVIDER_SITE_OTHER): Payer: Medicare HMO | Admitting: Nurse Practitioner

## 2020-10-16 ENCOUNTER — Encounter: Payer: Self-pay | Admitting: Nurse Practitioner

## 2020-10-16 VITALS — BP 122/76 | HR 70 | Temp 97.9°F | Ht 64.0 in | Wt 171.0 lb

## 2020-10-16 DIAGNOSIS — Z1159 Encounter for screening for other viral diseases: Secondary | ICD-10-CM

## 2020-10-16 DIAGNOSIS — Z0000 Encounter for general adult medical examination without abnormal findings: Secondary | ICD-10-CM | POA: Diagnosis not present

## 2020-10-16 DIAGNOSIS — E2839 Other primary ovarian failure: Secondary | ICD-10-CM | POA: Diagnosis not present

## 2020-10-16 DIAGNOSIS — Z1231 Encounter for screening mammogram for malignant neoplasm of breast: Secondary | ICD-10-CM

## 2020-10-16 NOTE — Patient Instructions (Addendum)
Catherine Gilmore , Thank you for taking time to come for your Medicare Wellness Visit. I appreciate your ongoing commitment to your health goals. Please review the following plan we discussed and let me know if I can assist you in the future.   Screening recommendations/referrals: Colorectal screening- cologuard  Mammogram RECOMMENDED yearly  Bone Density RECOMMENDED every 2 years Recommended yearly ophthalmology/optometry visit for glaucoma screening and checkup Recommended yearly dental visit for hygiene and checkup  Vaccinations: Influenza vaccine -recommend yearly Pneumococcal vaccine recommended now that you are 65 Tdap vaccine up to date Shingles vaccine recommended- to get at your local pharmacy    COVID vaccine- recommend to get at your local pharmacy  Advanced directives: recommended to complete and place on file.   Conditions/risks identified: complications related to smoking   Next appointment: yearly for AWV 3 months for CPE-with pap   Preventive Care 70 Years and Older, Female Preventive care refers to lifestyle choices and visits with your health care provider that can promote health and wellness. What does preventive care include? A yearly physical exam. This is also called an annual well check. Dental exams once or twice a year. Routine eye exams. Ask your health care provider how often you should have your eyes checked. Personal lifestyle choices, including: Daily care of your teeth and gums. Regular physical activity. Eating a healthy diet. Avoiding tobacco and drug use. Limiting alcohol use. Practicing safe sex. Taking low-dose aspirin every day. Taking vitamin and mineral supplements as recommended by your health care provider. What happens during an annual well check? The services and screenings done by your health care provider during your annual well check will depend on your age, overall health, lifestyle risk factors, and family history of  disease. Counseling  Your health care provider may ask you questions about your: Alcohol use. Tobacco use. Drug use. Emotional well-being. Home and relationship well-being. Sexual activity. Eating habits. History of falls. Memory and ability to understand (cognition). Work and work Statistician. Reproductive health. Screening  You may have the following tests or measurements: Height, weight, and BMI. Blood pressure. Lipid and cholesterol levels. These may be checked every 5 years, or more frequently if you are over 32 years old. Skin check. Lung cancer screening. You may have this screening every year starting at age 50 if you have a 30-pack-year history of smoking and currently smoke or have quit within the past 15 years. Fecal occult blood test (FOBT) of the stool. You may have this test every year starting at age 49. Flexible sigmoidoscopy or colonoscopy. You may have a sigmoidoscopy every 5 years or a colonoscopy every 10 years starting at age 63. Hepatitis C blood test. Hepatitis B blood test. Sexually transmitted disease (STD) testing. Diabetes screening. This is done by checking your blood sugar (glucose) after you have not eaten for a while (fasting). You may have this done every 1-3 years. Bone density scan. This is done to screen for osteoporosis. You may have this done starting at age 56. Mammogram. This may be done every 1-2 years. Talk to your health care provider about how often you should have regular mammograms. Talk with your health care provider about your test results, treatment options, and if necessary, the need for more tests. Vaccines  Your health care provider may recommend certain vaccines, such as: Influenza vaccine. This is recommended every year. Tetanus, diphtheria, and acellular pertussis (Tdap, Td) vaccine. You may need a Td booster every 10 years. Zoster vaccine. You may need this  after age 69. Pneumococcal 13-valent conjugate (PCV13) vaccine. One  dose is recommended after age 55. Pneumococcal polysaccharide (PPSV23) vaccine. One dose is recommended after age 54. Talk to your health care provider about which screenings and vaccines you need and how often you need them. This information is not intended to replace advice given to you by your health care provider. Make sure you discuss any questions you have with your health care provider. Document Released: 01/20/2015 Document Revised: 09/13/2015 Document Reviewed: 10/25/2014 Elsevier Interactive Patient Education  2017 [place] Prevention in the Home Falls can cause injuries. They can happen to people of all ages. There are many things you can do to make your home safe and to help prevent falls. What can I do on the outside of my home? Regularly fix the edges of walkways and driveways and fix any cracks. Remove anything that might make you trip as you walk through a door, such as a raised step or threshold. Trim any bushes or trees on the path to your home. Use bright outdoor lighting. Clear any walking paths of anything that might make someone trip, such as rocks or tools. Regularly check to see if handrails are loose or broken. Make sure that both sides of any steps have handrails. Any raised decks and porches should have guardrails on the edges. Have any leaves, snow, or ice cleared regularly. Use sand or salt on walking paths during winter. Clean up any spills in your garage right away. This includes oil or grease spills. What can I do in the bathroom? Use night lights. Install grab bars by the toilet and in the tub and shower. Do not use towel bars as grab bars. Use non-skid mats or decals in the tub or shower. If you need to sit down in the shower, use a plastic, non-slip stool. Keep the floor dry. Clean up any water that spills on the floor as soon as it happens. Remove soap buildup in the tub or shower regularly. Attach bath mats securely with double-sided  non-slip rug tape. Do not have throw rugs and other things on the floor that can make you trip. What can I do in the bedroom? Use night lights. Make sure that you have a light by your bed that is easy to reach. Do not use any sheets or blankets that are too big for your bed. They should not hang down onto the floor. Have a firm chair that has side arms. You can use this for support while you get dressed. Do not have throw rugs and other things on the floor that can make you trip. What can I do in the kitchen? Clean up any spills right away. Avoid walking on wet floors. Keep items that you use a lot in easy-to-reach places. If you need to reach something above you, use a strong step stool that has a grab bar. Keep electrical cords out of the way. Do not use floor polish or wax that makes floors slippery. If you must use wax, use non-skid floor wax. Do not have throw rugs and other things on the floor that can make you trip. What can I do with my stairs? Do not leave any items on the stairs. Make sure that there are handrails on both sides of the stairs and use them. Fix handrails that are broken or loose. Make sure that handrails are as long as the stairways. Check any carpeting to make sure that it is firmly attached to the  stairs. Fix any carpet that is loose or worn. Avoid having throw rugs at the top or bottom of the stairs. If you do have throw rugs, attach them to the floor with carpet tape. Make sure that you have a light switch at the top of the stairs and the bottom of the stairs. If you do not have them, ask someone to add them for you. What else can I do to help prevent falls? Wear shoes that: Do not have high heels. Have rubber bottoms. Are comfortable and fit you well. Are closed at the toe. Do not wear sandals. If you use a stepladder: Make sure that it is fully opened. Do not climb a closed stepladder. Make sure that both sides of the stepladder are locked into place. Ask  someone to hold it for you, if possible. Clearly mark and make sure that you can see: Any grab bars or handrails. First and last steps. Where the edge of each step is. Use tools that help you move around (mobility aids) if they are needed. These include: Canes. Walkers. Scooters. Crutches. Turn on the lights when you go into a dark area. Replace any light bulbs as soon as they burn out. Set up your furniture so you have a clear path. Avoid moving your furniture around. If any of your floors are uneven, fix them. If there are any pets around you, be aware of where they are. Review your medicines with your doctor. Some medicines can make you feel dizzy. This can increase your chance of falling. Ask your doctor what other things that you can do to help prevent falls. This information is not intended to replace advice given to you by your health care provider. Make sure you discuss any questions you have with your health care provider. Document Released: 10/20/2008 Document Revised: 06/01/2015 Document Reviewed: 01/28/2014 Elsevier Interactive Patient Education  2017 [employer].

## 2020-10-16 NOTE — Progress Notes (Signed)
Subjective:    Catherine Gilmore is a 65 y.o. female who presents for a Welcome to Medicare exam.   Review of Systems         Objective:    Today's Vitals   10/16/20 0929  BP: 122/76  Pulse: 70  Temp: 97.9 F (36.6 C)  TempSrc: Temporal  SpO2: 97%  Weight: 171 lb (77.6 kg)  Height: 5\' 4"  (1.626 m)  Body mass index is 29.35 kg/m.  Medications Outpatient Encounter Medications as of 10/16/2020  Medication Sig   DULoxetine (CYMBALTA) 30 MG capsule Take 2 capsules (60 mg total) by mouth daily.   LORazepam (ATIVAN) 0.5 MG tablet Take 1 tablet (0.5 mg total) by mouth daily.   losartan (COZAAR) 50 MG tablet Take 1 tablet by mouth daily.   omeprazole (PRILOSEC) 20 MG capsule TAKE 1 CAPSULE (20 MG TOTAL) BY MOUTH DAILY.   pravastatin (PRAVACHOL) 20 MG tablet TAKE 1 TABLET (20 MG TOTAL) BY MOUTH DAILY.   triamterene-hydrochlorothiazide (MAXZIDE-25) 37.5-25 MG tablet Take 1 tablet by mouth daily.   No facility-administered encounter medications on file as of 10/16/2020.     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   SVT ablation     x2    Family History  Problem Relation Age of Onset   Cancer Mother        lung   Cirrhosis Father    Social History   Occupational History   Not on file  Tobacco Use   Smoking status: Every Day    Packs/day: 1.50    Types: Cigarettes    Start date: 04/07/2005   Smokeless tobacco: Never  Vaping Use   Vaping Use: Never used  Substance and  Sexual Activity   Alcohol use: Not Currently   Drug use: No   Sexual activity: Yes    Tobacco Counseling Ready to quit: Not Answered Counseling given: Not Answered   Immunizations and Health Maintenance Immunization History  Administered Date(s) Administered   Influenza Split 01/24/2011, 10/17/2011   Influenza Whole 10/02/2007, 10/05/2008, 11/22/2009   Pneumococcal Polysaccharide-23 08/14/2016   Tdap 01/25/2011   Health Maintenance Due  Topic Date Due   Zoster Vaccines- Shingrix (1 of 2) Never done   COLONOSCOPY (Pts 45-70yrs Insurance coverage will need to be confirmed)  Never done   MAMMOGRAM  02/03/2011   PAP SMEAR-Modifier  06/15/2018   DEXA SCAN  Never done    Activities of Daily Living No flowsheet data found.  Advanced Directives: Does Patient Have a Medical Advance Directive?: No Would patient like information on creating a medical advance directive?: Yes (MAU/Ambulatory/Procedural Areas - Information given)    Assessment:    This is a routine wellness examination for this patient .   Vision/Hearing screen Hearing Screening - Comments:: Decreased hearing.  Vision Screening - Comments:: Last eye exam greater than 12 months ago  Dietary issues and exercise activities discussed:  Goals      LDL CALC < 130       Depression Screen PHQ 2/9 Scores 10/16/2020 06/18/2019 09/10/2018 02/20/2018  PHQ - 2 Score 6 2 1  0  PHQ- 9 Score 14 12 - -     Fall Risk Fall Risk  10/16/2020  Falls in the past year? 0  Number falls in past yr: 0  Injury with Fall? 0  Risk for fall due to : No Fall Risks  Follow up Falls evaluation completed    Cognitive Function:        Patient Care Team: [name], NP as PCP - General (Geriatric Medicine)     Plan:     I have personally reviewed and noted the following in the patient's chart:   Medical and social history Use of alcohol, tobacco or illicit drugs  Current medications and supplements Functional  ability and status Nutritional status Physical activity Advanced directives List of other physicians Hospitalizations, surgeries, and ER visits in previous 12 months Vitals Screenings to include cognitive, depression, and falls Referrals and appointments  In addition, I have reviewed and discussed with patient certain preventive protocols, quality metrics, and best practice recommendations. A written personalized care plan for preventive services as well as general preventive health recommendations were provided to patient.     [name], NP 10/16/2020

## 2020-11-08 ENCOUNTER — Other Ambulatory Visit: Payer: Self-pay | Admitting: Nurse Practitioner

## 2020-11-08 DIAGNOSIS — F419 Anxiety disorder, unspecified: Secondary | ICD-10-CM

## 2020-11-20 ENCOUNTER — Other Ambulatory Visit: Payer: Self-pay | Admitting: *Deleted

## 2020-11-20 DIAGNOSIS — F419 Anxiety disorder, unspecified: Secondary | ICD-10-CM

## 2020-11-20 MED ORDER — DULOXETINE HCL 30 MG PO CPEP: 60.0000 mg | ORAL_CAPSULE | Freq: Every day | ORAL | 1 refills | Status: DC

## 2020-11-20 NOTE — Telephone Encounter (Signed)
Patient requested refill to be sent to [place], cheaper copay.

## 2020-11-28 ENCOUNTER — Other Ambulatory Visit: Payer: Self-pay | Admitting: *Deleted

## 2020-11-28 DIAGNOSIS — K219 Gastro-esophageal reflux disease without esophagitis: Secondary | ICD-10-CM

## 2020-11-28 MED ORDER — OMEPRAZOLE 20 MG PO CPDR: 20.0000 mg | DELAYED_RELEASE_CAPSULE | Freq: Every day | ORAL | 3 refills | Status: DC

## 2020-11-28 NOTE — Telephone Encounter (Signed)
[facility] requested refill.  

## 2021-01-19 ENCOUNTER — Encounter: Payer: Self-pay | Admitting: Orthopedic Surgery

## 2021-01-19 ENCOUNTER — Other Ambulatory Visit: Payer: Self-pay

## 2021-01-19 ENCOUNTER — Telehealth (INDEPENDENT_AMBULATORY_CARE_PROVIDER_SITE_OTHER): Payer: Medicare HMO | Admitting: Orthopedic Surgery

## 2021-01-19 ENCOUNTER — Telehealth: Payer: Self-pay

## 2021-01-19 DIAGNOSIS — J320 Chronic maxillary sinusitis: Secondary | ICD-10-CM | POA: Diagnosis not present

## 2021-01-19 DIAGNOSIS — H938X3 Other specified disorders of ear, bilateral: Secondary | ICD-10-CM

## 2021-01-19 MED ORDER — AZITHROMYCIN 250 MG PO TABS: ORAL_TABLET | ORAL | 0 refills | Status: AC

## 2021-01-19 NOTE — Progress Notes (Signed)
Careteam: Patient Care Team: Catherine Chandler, NP as PCP - General (Geriatric Medicine)  Seen by: Catherine Gilmore, AGNP-C  PLACE OF SERVICE:  [place] Directive information    Allergies  Allergen Reactions   Amoxicillin     Causes a bad yeast infection    Lisinopril     REACTION: Cough    Chief Complaint  Patient presents with   Acute Visit    Cold since December 2022. Patient c/o head congestion and ear fullness. Visit completed via telephone/video visit.      HPI: Patient is a 66 y.o. female seen today via video visit due to unresolved nasal congestion and ear fullness.   Reports common cold symptoms began 2 days after christmas. Nasal congestion was green and yellow. She began to feel better a few days after symptom onset. Today, she describes ear fullness and rebound nasal congestion, symptoms began about 7 days ago. She is having trouble hearing. Mild pain ear pain when trying to "pop" ears. No ear drainage. In addition, her nasal congestion has returned and drainage is clear or yellow. She is having some mild facial tenderness. She is not taking any medication at this time. History of chronic maxillary sinusitis. Denies fever, cough, sore throat, malaise or body aches.   Review of Systems:  Review of Systems  Constitutional:  Negative for chills, fever, malaise/fatigue and weight loss.  HENT:  Positive for congestion, ear pain, hearing loss and sinus pain. Negative for ear discharge and sore throat.   Respiratory:  Negative for cough, shortness of breath and wheezing.   Cardiovascular:  Negative for chest pain.  Musculoskeletal:  Negative for myalgias.  Psychiatric/Behavioral:  Negative for depression. The patient is not nervous/anxious.    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 She started smoking about 15 years ago. She has been smoking an average of 1 pack per day. She has never used smokeless tobacco. She reports that she does not currently use alcohol. She reports that she does not use drugs.  Family History  Problem Relation Age of Onset   Cancer Mother        lung   Cirrhosis Father     Medications: Patient's Medications  New Prescriptions   No medications on file  Previous Medications   DULOXETINE (CYMBALTA) 30 MG CAPSULE    Take 2 capsules (60 mg total) by mouth daily.   LORAZEPAM (ATIVAN) 0.5 MG TABLET    Take 1 tablet (0.5 mg total) by mouth daily.   LOSARTAN (COZAAR) 50 MG TABLET    Take 1 tablet by mouth daily.   OMEPRAZOLE (PRILOSEC) 20 MG CAPSULE    Take 1 capsule (20 mg total) by mouth daily.   PRAVASTATIN (PRAVACHOL) 20 MG TABLET    TAKE  1 TABLET (20 MG TOTAL) BY MOUTH DAILY.   TRIAMTERENE-HYDROCHLOROTHIAZIDE (MAXZIDE-25) 37.5-25 MG TABLET    Take 1 tablet by mouth daily.  Modified Medications   No medications on file  Discontinued Medications   No medications on file    Physical Exam:  There were no vitals filed for this visit. There is no height or weight on file to calculate BMI. Wt Readings from Last 3 Encounters:  10/16/20 171 lb (77.6 kg)  09/25/20 167 lb (75.8 kg)  09/11/20 162 lb (73.5 kg)    Physical Exam Vitals reviewed.  Constitutional:      General: She is not in acute distress.    Appearance: She is not ill-appearing.  Neurological:     Mental Status: She is alert.  Unable to complete assessment due to  virtual visit  Labs reviewed: Basic Metabolic Panel: Recent Labs    09/25/20 1122  NA 136  K 4.1  CL 101  CO2 28  GLUCOSE 111  BUN 10  CREATININE 0.66  CALCIUM 9.3  TSH 2.56   Liver Function Tests: Recent Labs    09/25/20 1122  AST 16  ALT 17  BILITOT 0.5  PROT 6.6   No results for input(s): LIPASE, AMYLASE in the last 8760 hours. No results for input(s): AMMONIA in the last 8760 hours. CBC: Recent Labs    09/25/20 1122  WBC 10.4  NEUTROABS 7,540  HGB 14.9  HCT 43.3  MCV 89.3  PLT 373   Lipid Panel: Recent Labs    09/25/20 1122  CHOL 203*  HDL 71  LDLCALC 106*  TRIG 144  CHOLHDL 2.9   TSH: Recent Labs    09/25/20 1122  TSH 2.56   A1C: Lab Results  Component Value Date   HGBA1C 5.4 03/12/2019     Assessment/Plan 1. Chronic maxillary sinusitis - onset 12/27, rebound symptoms of nasal congestion and facial pain began 01/06 - azithromycin (ZITHROMAX) 250 MG tablet; Take 2 tablets on day 1, then 1 tablet daily on days 2 through 5  Dispense: 6 tablet; Refill: 0  2. Sensation of fullness in both ears - suspect eustachian tube fullness due to sinusitis - recommend starting Claritin 10 mg po daily x 14 days     Virtual Visit   I connected with Catherine Gilmore by video and verified that I am speaking with the correct person using two identifiers.   Patient:Catherine Gilmore Patient location: home Provider: Windell Gilmore, AGNP-C Provider location: [facility]    I discussed the limitations, risks, security and privacy concerns of performing an evaluation and management service by telephone and the availability of in person appointments. I also discussed with the patient that there may be a patient responsible charge related to this service. The patient expressed understanding and agreed to proceed.    I discussed the assessment and treatment plan with the patient. The patient was provided an opportunity to ask questions and all were answered.  The patient agreed with the plan and demonstrated an understanding of the instructions.     The patient was advised to call back or seek an in-person evaluation if the symptoms worsen or if the condition fails to improve as anticipated.   I provided 7 minutes of non-face-to-face time during this encounter.   Lerlene Treadwell Cleophas Dunker, [place] printed and mailed     Next appt: none Jahsiah Carpenter [place], [facility] [phone]

## 2021-01-19 NOTE — Telephone Encounter (Signed)
Ms. breleigh, carpino are scheduled for a virtual visit with your provider today.    Just as we do with appointments in the office, we must obtain your consent to participate.  Your consent will be active for this visit and any virtual visit you may have with one of our providers in the next 365 days.    If you have a MyChart account, I can also send a copy of this consent to you electronically.  All virtual visits are billed to your insurance company just like a traditional visit in the office.  As this is a virtual visit, video technology does not allow for your provider to perform a traditional examination.  This may limit your provider's ability to fully assess your condition.  If your provider identifies any concerns that need to be evaluated in person or the need to arrange testing such as labs, EKG, etc, we will make arrangements to do so.    Although advances in technology are sophisticated, we cannot ensure that it will always work on either your end or our end.  If the connection with a video visit is poor, we may have to switch to a telephone visit.  With either a video or telephone visit, we are not always able to ensure that we have a secure connection.   I need to obtain your verbal consent now.   Are you willing to proceed with your visit today?   Priscila Aispuro has provided verbal consent on 01/19/2021 for a virtual visit (video or telephone).   Leigh [name], [place] 01/19/2021  2:10 pm

## 2021-01-19 NOTE — Progress Notes (Signed)
° °  This service is provided via telemedicine  No vital signs collected/recorded due to the encounter was a telemedicine visit.   Location of patient (ex: home, work):  Home  Patient consents to a telephone visit: Yes, see telephone visit dated 01/19/21  Location of the provider (ex: office, home):  [facility], Office   Name of any referring provider:  N/A  Names of all persons participating in the telemedicine service and their role in the encounter:  S.Chrae B/CMA, [name], NP, and Patient   Time spent on call:  6 min with medical assistant

## 2021-01-19 NOTE — Patient Instructions (Signed)
Please finish antibiotic- even if you feel better  Please take Claritin or Zyrtec for ear fullness- 1 tablet every night x 7 days  Avoid medications that say "DM" due to blood pressure medication use- Claritin/Zyrtec of to use with blood pressure medications  Please contact PCP if symptoms to not resolve for worsen

## 2021-01-22 ENCOUNTER — Ambulatory Visit: Payer: Medicare HMO | Admitting: Nurse Practitioner

## 2021-01-29 ENCOUNTER — Encounter: Payer: Self-pay | Admitting: Nurse Practitioner

## 2021-01-29 ENCOUNTER — Ambulatory Visit (INDEPENDENT_AMBULATORY_CARE_PROVIDER_SITE_OTHER): Payer: Medicare HMO | Admitting: Nurse Practitioner

## 2021-01-29 ENCOUNTER — Other Ambulatory Visit: Payer: Self-pay

## 2021-01-29 VITALS — BP 132/80 | HR 83 | Temp 97.9°F | Ht 64.0 in | Wt 171.0 lb

## 2021-01-29 DIAGNOSIS — I10 Essential (primary) hypertension: Secondary | ICD-10-CM

## 2021-01-29 DIAGNOSIS — E785 Hyperlipidemia, unspecified: Secondary | ICD-10-CM

## 2021-01-29 DIAGNOSIS — F419 Anxiety disorder, unspecified: Secondary | ICD-10-CM | POA: Diagnosis not present

## 2021-01-29 DIAGNOSIS — Z1211 Encounter for screening for malignant neoplasm of colon: Secondary | ICD-10-CM | POA: Diagnosis not present

## 2021-01-29 DIAGNOSIS — J3489 Other specified disorders of nose and nasal sinuses: Secondary | ICD-10-CM | POA: Diagnosis not present

## 2021-01-29 DIAGNOSIS — Z1212 Encounter for screening for malignant neoplasm of rectum: Secondary | ICD-10-CM

## 2021-01-29 DIAGNOSIS — K219 Gastro-esophageal reflux disease without esophagitis: Secondary | ICD-10-CM

## 2021-01-29 DIAGNOSIS — F17200 Nicotine dependence, unspecified, uncomplicated: Secondary | ICD-10-CM

## 2021-01-29 LAB — CBC WITH DIFFERENTIAL/PLATELET
Absolute Monocytes: 520 cells/uL (ref 200–950)
Basophils Absolute: 42 cells/uL (ref 0–200)
Basophils Relative: 0.4 %
Eosinophils Absolute: 468 cells/uL (ref 15–500)
Eosinophils Relative: 4.5 %
HCT: 42.5 % (ref 35.0–45.0)
Hemoglobin: 14.4 g/dL (ref 11.7–15.5)
Lymphs Abs: 2028 cells/uL (ref 850–3900)
MCH: 30.8 pg (ref 27.0–33.0)
MCHC: 33.9 g/dL (ref 32.0–36.0)
MCV: 90.8 fL (ref 80.0–100.0)
MPV: 8.9 fL (ref 7.5–12.5)
Monocytes Relative: 5 %
Neutro Abs: 7342 cells/uL (ref 1500–7800)
Neutrophils Relative %: 70.6 %
Platelets: 336 10*3/uL (ref 140–400)
RBC: 4.68 10*6/uL (ref 3.80–5.10)
RDW: 12.8 % (ref 11.0–15.0)
Total Lymphocyte: 19.5 %
WBC: 10.4 10*3/uL (ref 3.8–10.8)

## 2021-01-29 LAB — COMPLETE METABOLIC PANEL WITH GFR
AG Ratio: 1.9 (calc) (ref 1.0–2.5)
ALT: 19 U/L (ref 6–29)
AST: 18 U/L (ref 10–35)
Albumin: 3.9 g/dL (ref 3.6–5.1)
Alkaline phosphatase (APISO): 62 U/L (ref 37–153)
BUN: 11 mg/dL (ref 7–25)
CO2: 29 mmol/L (ref 20–32)
Calcium: 9.2 mg/dL (ref 8.6–10.4)
Chloride: 106 mmol/L (ref 98–110)
Creat: 0.79 mg/dL (ref 0.50–1.05)
Globulin: 2.1 g/dL (calc) (ref 1.9–3.7)
Glucose, Bld: 94 mg/dL (ref 65–99)
Potassium: 4.1 mmol/L (ref 3.5–5.3)
Sodium: 138 mmol/L (ref 135–146)
Total Bilirubin: 0.5 mg/dL (ref 0.2–1.2)
Total Protein: 6 g/dL — ABNORMAL LOW (ref 6.1–8.1)
eGFR: 83 mL/min/{1.73_m2} (ref >=60)

## 2021-01-29 MED ORDER — LORAZEPAM 0.5 MG PO TABS: 0.5000 mg | ORAL_TABLET | Freq: Every day | ORAL | 0 refills | Status: DC

## 2021-01-29 MED ORDER — DULOXETINE HCL 60 MG PO CPEP: 60.0000 mg | ORAL_CAPSULE | Freq: Every day | ORAL | 1 refills | Status: DC

## 2021-01-29 NOTE — Patient Instructions (Signed)
[facility]  [address] [place], [place] [address] Phone: [place] [address] [place], [place] [address] Phone: [phone]  [facility] [address] [place], [place] [address] Phone [phone]  [place], [place], [place], [place] [address] [place] [address] [place], [place] [address] [phone] In home visit available with prior arrangement

## 2021-01-29 NOTE — Progress Notes (Signed)
Careteam: Patient Care Team: [name], NP as PCP - General (Geriatric Medicine)  PLACE OF SERVICE:  [place]  Advanced Directive information    Allergies  Allergen Reactions   Amoxicillin     Causes a bad yeast infection    Lisinopril     REACTION: Cough    Chief Complaint  Patient presents with   Medical Management of Chronic Issues    3 month follow-up. Discuss need for shingrix, colonoscopy, PCV, pap, and td/tdap or post pone if patient refuses. Patient c/o ongoing ear fullness, sinus pressure in forehead area, and nasal congestions. Patient takes b/p medication off/on due to urinary frequency when taking. Patient is undecided about flu vaccine   Coagulation Disorder     HPI: Patient is a 66 y.o. female for routine follow up.   Unsure when last pap was- over 3 years ago.   No family hx of colon cancer, no colonoscopy at all  Has sinus congestion and does not want to get flu vaccine today.   Since christmas she has had sinus congestion.she took a zpac and zyrtec. Has fullness in ears and congestion. Headache over eyes. Overall doing better.   Follow up with cardiologist, wore a montir was in [place], felt like some of the palpitations from anxiety  Anxiety/depression- denies depression. 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 She feels horrible that she is having a hard time with it.   Now has Son living with her. She is having to watch him as well.  Uses lorazepam PRN- does not take routinely, maybe has used 15 since last visit.   Htn- does not take medication every day due to making her urinate so much.  Review of Systems:  Review of Systems  Constitutional:  Negative for chills, fever and weight loss.  HENT:  Positive for congestion. Negative for tinnitus.   Respiratory:  Negative for cough, sputum production and shortness of breath.   Cardiovascular:  Positive for palpitations. Negative for chest pain and leg  swelling.  Gastrointestinal:  Negative for abdominal pain, constipation, diarrhea and heartburn.  Genitourinary:  Positive for frequency. Negative for dysuria and urgency.  Musculoskeletal:  Negative for back pain, falls, joint pain and myalgias.  Skin: Negative.   Neurological:  Negative for dizziness and headaches.  Psychiatric/Behavioral:  Negative for depression and memory loss. The patient is nervous/anxious. The patient does not have insomnia.    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 She started smoking about 15 years ago. She has been smoking an average of 1 pack per day. She has never used smokeless tobacco. She reports current alcohol use of about 3.0 standard drinks per week. She reports that she does not use drugs.  Family History  Problem Relation Age of Onset   Cancer Mother        lung   Cirrhosis Father     Medications: Patient's Medications  New Prescriptions   No medications on file  Previous Medications   CETIRIZINE (ZYRTEC) 10 MG TABLET    Take 10 mg by mouth daily.   DULOXETINE (CYMBALTA) 30 MG CAPSULE    Take 2 capsules (60 mg total) by mouth daily.   LORAZEPAM (ATIVAN) 0.5 MG TABLET    Take 1 tablet (0.5 mg total) by mouth daily.   LOSARTAN (COZAAR) 50 MG TABLET    Take 1 tablet by mouth daily.   OMEPRAZOLE (PRILOSEC) 20 MG CAPSULE    Take 1 capsule (20 mg total) by mouth daily.    PRAVASTATIN (PRAVACHOL) 20 MG TABLET    TAKE 1 TABLET (20 MG TOTAL) BY MOUTH DAILY.   TRIAMTERENE-HYDROCHLOROTHIAZIDE (MAXZIDE-25) 37.5-25 MG TABLET    Take 1 tablet by mouth daily.  Modified Medications   No medications on file  Discontinued Medications   No medications on file    Physical Exam:  Vitals:   01/29/21 0932  BP: 132/80  Pulse: 83  Temp: 97.9 F (36.6 C)  TempSrc: Temporal  SpO2: 97%  Weight: 171 lb (77.6 kg)  Height: $Remove'5\' 4"'emtqpLc$  (1.626 m)   Body mass index is 29.35 kg/m. Wt Readings from Last 3 Encounters:  01/29/21 171 lb (77.6 kg)  10/16/20 171 lb (77.6 kg)  09/25/20 167 lb (75.8 kg)    Physical Exam Constitutional:      General: She is not in acute distress.    Appearance: She is well-developed. She is not diaphoretic.  HENT:     Head: Normocephalic and atraumatic.     Mouth/Throat:     Pharynx: No oropharyngeal exudate.  Eyes:     Conjunctiva/sclera: Conjunctivae normal.     Pupils: Pupils are equal, round, and reactive to light.  Cardiovascular:     Rate and Rhythm: Normal rate and regular rhythm.     Heart sounds: Normal heart sounds.  Pulmonary:     Effort: Pulmonary effort is normal.     Breath sounds: Normal breath sounds.  Abdominal:     General: Bowel sounds are normal.     Palpations: Abdomen is soft.  Musculoskeletal:     Cervical back: Normal range of motion and neck supple.     Right lower leg: No edema.     Left lower leg: No edema.  Skin:    General: Skin is warm and dry.  Neurological:     Mental Status: She is alert.  Psychiatric:        Mood and Affect: Mood normal.    Labs reviewed: Basic Metabolic Panel: Recent Labs    09/25/20 1122  NA 136  K 4.1  CL 101  CO2 28  GLUCOSE 111  BUN 10  CREATININE 0.66  CALCIUM 9.3  TSH 2.56   Liver Function Tests: Recent Labs    09/25/20 1122  AST 16  ALT 17  BILITOT 0.5  PROT 6.6   No results for input(s): LIPASE, AMYLASE in the last 8760 hours. No results for  input(s): AMMONIA in the last 8760 hours. CBC: Recent Labs    09/25/20 1122  WBC 10.4  NEUTROABS 7,540  HGB 14.9  HCT 43.3  MCV 89.3  PLT 373   Lipid Panel: Recent Labs    09/25/20 1122  CHOL 203*  HDL 71  LDLCALC 106*  TRIG 144  CHOLHDL 2.9   TSH: Recent Labs    09/25/20 1122  TSH 2.56   A1C: Lab Results  Component Value Date   HGBA1C 5.4 03/12/2019     Assessment/Plan 1. Anxiety -stable on cymbalta with lifestyle modifications, rarely will use ativan and only for panic  - DULoxetine (CYMBALTA) 60 MG capsule; Take 1 capsule (60 mg total) by mouth daily.  Dispense: 90 capsule; Refill: 1 - LORazepam (ATIVAN) 0.5 MG tablet; Take 1 tablet (0.5 mg total) by mouth daily.  Dispense: 30 tablet; Refill: 0 - names of counseling and therapy groups given due to increase in stress in home.   2. Encounter for colorectal cancer screening - Cologuard  3. Sinus pressure -Netipot or saline wash daily Plain nasal saline spray throughout the day as needed humidifier in the home to help with the dry air Avoid forcefully blowing nose   4. Hyperlipidemia LDL goal <100 Continue on statin with dietary modification.  5. Essential hypertension -she does not always take diuretic due to side effect, discussed changing medication to promote success and proper control of blood pressure. She will start taking routinely but if not able to tolerate will notify.  - CMP with eGFR([facility]) - CBC with Differential/Platelet  6. Gastroesophageal reflux disease, unspecified whether esophagitis present The current medical regimen is effective;  continue present plan and medications.  7. Smoker -Smoking cessation instruction/counseling given:  counseled patient on the dangers of tobacco use, advised patient to stop smoking, and reviewed strategies to maximize success    Next appt: 3 months, pap at time of visit.  Carlos American. [place], [facility] [phone]

## 2021-04-21 ENCOUNTER — Other Ambulatory Visit: Payer: Self-pay | Admitting: Nurse Practitioner

## 2021-04-21 DIAGNOSIS — I10 Essential (primary) hypertension: Secondary | ICD-10-CM

## 2021-04-21 DIAGNOSIS — E785 Hyperlipidemia, unspecified: Secondary | ICD-10-CM

## 2021-04-27 NOTE — Progress Notes (Signed)
? ? ?Careteam: ?Patient Care Team: ?[name], NP as PCP - General (Geriatric Medicine) ? ?PLACE OF SERVICE:  ?[facility]  ?Advanced Directive information ?Does Patient Have a Medical Advance Directive?: No, Would patient like information on creating a medical advance directive?: No - Patient declined ? ?Allergies  ?Allergen Reactions  ? Amoxicillin   ?  Causes a bad yeast infection   ? Lisinopril   ?  REACTION: Cough  ? ? ?Chief Complaint  ?Patient presents with  ? Health Maintenance  ?  Discuss the need for Dexa scan, Mammogram, and Colonoscopy.  ? Medical Management of Chronic Issues  ?  3 month follow up/ Pap smear  ? Immunizations  ?  Discuss the need for Pne vaccine, Shingrix vaccine, and Tetanus vaccine.  ? ? ? ?HPI: Patient is a 66 y.o. female for routine follow up.  ?Wants to defer pap tday ? ?Went to [employer] for a few days and that helped but but now shes back into the responsibilities. 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  ? ?Htn- she reports she only taking the trimaterene-hctz every other day- she feels "drugged" when using every day. Using losartan daily ? ?Hyperlipidemia- on pravachol daily ? ?Smoker- has not cut back.  ? ?Has cologuard but has not used yet.  ? ? ?Review of Systems:  ?Review of Systems  ?Constitutional:  Negative for chills, fever and weight loss.  ?HENT:  Negative for tinnitus.   ?Respiratory:  Negative for cough, sputum production and shortness of breath.   ?Cardiovascular:  Negative for chest pain, palpitations and leg swelling.  ?Gastrointestinal:  Negative for abdominal pain, constipation, diarrhea and heartburn.  ?Genitourinary:  Negative for dysuria, frequency and urgency.  ?Musculoskeletal:  Negative for back pain, falls, joint pain and myalgias.  ?Skin: Negative.   ?Neurological:  Negative for dizziness and headaches.  ?Psychiatric/Behavioral:  Negative for depression and memory loss. The  patient does not have insomnia.   ? ?Past Medical History:  ?Diagnosis Date  ? Anxiety   ? Back pain, chronic   ? Flushing   ? GERD (gastroesophageal reflux disease)   ? Heart palpitations 03/2019  ? History of gestational diabetes   ? History of sinus problem   ? History of sinus problem   ? Hyperlipidemia   ? Hypertension   ? Hypokalemia   ? Mitral valve prolapse   ? Polycythemia   ? Supraventricular tachycardia, paroxysmal ([place]) 01/1998,02/1998  ? s/p ablation and cardioversion   ? ?Past Surgical History:  ?Procedure Laterality Date  ? 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 SVT ablation    ? x2  ? ?Social History: ?  reports that she has been smoking cigarettes. She started smoking about 16 years ago. She has been smoking an average of 1 pack per day. She has never used smokeless tobacco. She reports current alcohol use of about 3.0 standard drinks per week. She reports that she does not use drugs. ? ?Family History  ?Problem Relation Age of Onset  ? Cancer Mother   ?     lung  ? Cirrhosis Father   ? ? ?Medications: ?Patient's Medications  ?New Prescriptions  ? No medications on file  ?Previous Medications  ? CETIRIZINE (ZYRTEC) 10 MG TABLET    Take 10 mg by mouth daily.  ? DULOXETINE (CYMBALTA) 60 MG CAPSULE    Take 1 capsule (  60 mg total) by mouth daily.  ? LORAZEPAM (ATIVAN) 0.5 MG TABLET    Take 1 tablet (0.5 mg total) by mouth daily.  ? LOSARTAN (COZAAR) 50 MG TABLET    TAKE 1 TABLET EVERY DAY  ? OMEPRAZOLE (PRILOSEC) 20 MG CAPSULE    Take 1 capsule (20 mg total) by mouth daily.  ? PRAVASTATIN (PRAVACHOL) 20 MG TABLET    TAKE 1 TABLET EVERY DAY  ? TRIAMTERENE-HYDROCHLOROTHIAZIDE (MAXZIDE-25) 37.5-25 MG TABLET    TAKE 1 TABLET EVERY DAY  ?Modified Medications  ? No medications on file  ?Discontinued Medications  ? No medications on file  ? ? ?Physical Exam: ? ?Vitals:  ? 04/30/21 1024  ?BP: 130/88  ?Pulse: 73  ?Resp: 16  ?Temp: (!) 96.7 ?F (35.9 ?C)  ?SpO2: 98%  ?Weight: 167 lb 12.8 oz (76.1 kg)  ?Height: 5'  4" (1.626 m)  ? ?Body mass index is 28.8 kg/m?. ?Wt Readings from Last 3 Encounters:  ?04/30/21 167 lb 12.8 oz (76.1 kg)  ?01/29/21 171 lb (77.6 kg)  ?10/16/20 171 lb (77.6 kg)  ? ? ?Physical Exam ?Constitutional:   ?   General: She is not in acute distress. ?   Appearance: She is well-developed. She is not diaphoretic.  ?HENT:  ?   Head: Normocephalic and atraumatic.  ?   Mouth/Throat:  ?   Pharynx: No oropharyngeal exudate.  ?Eyes:  ?   Conjunctiva/sclera: Conjunctivae normal.  ?   Pupils: Pupils are equal, round, and reactive to light.  ?Cardiovascular:  ?   Rate and Rhythm: Normal rate and regular rhythm.  ?   Heart sounds: Normal heart sounds.  ?Pulmonary:  ?   Effort: Pulmonary effort is normal.  ?   Breath sounds: Normal breath sounds.  ?Abdominal:  ?   General: Bowel sounds are normal.  ?   Palpations: Abdomen is soft.  ?Musculoskeletal:  ?   Cervical back: Normal range of motion and neck supple.  ?   Right lower leg: No edema.  ?   Left lower leg: No edema.  ?Skin: ?   General: Skin is warm and dry.  ?Neurological:  ?   Mental Status: She is alert.  ?Psychiatric:     ?   Mood and Affect: Mood normal.  ? ? ?Labs reviewed: ?Basic Metabolic Panel: ?Recent Labs  ?  09/25/20 ?1122 01/29/21 ?1008  ?NA 136 138  ?K 4.1 4.1  ?CL 101 106  ?CO2 28 29  ?GLUCOSE 111 94  ?BUN 10 11  ?CREATININE 0.66 0.79  ?CALCIUM 9.3 9.2  ?TSH 2.56  --   ? ?Liver Function Tests: ?Recent Labs  ?  09/25/20 ?1122 01/29/21 ?1008  ?AST 16 18  ?ALT 17 19  ?BILITOT 0.5 0.5  ?PROT 6.6 6.0*  ? ?No results for input(s): LIPASE, AMYLASE in the last 8760 hours. ?No results for input(s): AMMONIA in the last 8760 hours. ?CBC: ?Recent Labs  ?  09/25/20 ?1122 01/29/21 ?1008  ?WBC 10.4 10.4  ?NEUTROABS 7,540 7,342  ?HGB 14.9 14.4  ?HCT 43.3 42.5  ?MCV 89.3 90.8  ?PLT 373 336  ? ?Lipid Panel: ?Recent Labs  ?  09/25/20 ?1122  ?CHOL 203*  ?HDL 71  ?LDLCALC 106*  ?TRIG 144  ?CHOLHDL 2.9  ? ?TSH: ?Recent Labs  ?  09/25/20 ?1122  ?TSH 2.56  ? ?A1C: ?Lab  Results  ?Component Value Date  ? HGBA1C 5.4 03/12/2019  ? ? ? ?Assessment/Plan ?1. Essential hypertension ?Blood pressure well controlled ?Continue  current medications ?Recheck metabolic panel ? ?2. Allergic rhinitis, unspecified seasonality, unspecified trigger ?-stable at this time, continue OTC ? ?3. Gastroesophageal reflux disease, unspecified whether esophagitis present ?-controlled on omeprazole ? ?4. TOBACCO ABUSE ?-continues to smoke, encouraged cessation. ? ?5. Hyperlipidemia LDL goal <100 ?Slightly elevated on last labs, continues on Pravachol with dietary modifications.  ? ?6. Anxiety ?Stable on Cymbalta, encouraged lifestyle modifications as well.  ? ? ? ?Return in about 6 months (around 10/30/2021) for routne follow up, PAP- fasting lab work at time of appt, . Declines cervical screening today- will do at next visit ?Carlos American. Dewaine Oats, AGNP ? ?[facility] ?[phone]  ?

## 2021-04-30 ENCOUNTER — Encounter: Payer: Self-pay | Admitting: Nurse Practitioner

## 2021-04-30 ENCOUNTER — Ambulatory Visit (INDEPENDENT_AMBULATORY_CARE_PROVIDER_SITE_OTHER): Payer: Medicare HMO | Admitting: Nurse Practitioner

## 2021-04-30 VITALS — BP 130/88 | HR 73 | Temp 96.7°F | Resp 16 | Ht 64.0 in | Wt 167.8 lb

## 2021-04-30 DIAGNOSIS — F419 Anxiety disorder, unspecified: Secondary | ICD-10-CM

## 2021-04-30 DIAGNOSIS — J309 Allergic rhinitis, unspecified: Secondary | ICD-10-CM

## 2021-04-30 DIAGNOSIS — F17200 Nicotine dependence, unspecified, uncomplicated: Secondary | ICD-10-CM

## 2021-04-30 DIAGNOSIS — K219 Gastro-esophageal reflux disease without esophagitis: Secondary | ICD-10-CM | POA: Diagnosis not present

## 2021-04-30 DIAGNOSIS — E785 Hyperlipidemia, unspecified: Secondary | ICD-10-CM | POA: Diagnosis not present

## 2021-04-30 DIAGNOSIS — I10 Essential (primary) hypertension: Secondary | ICD-10-CM

## 2021-04-30 DIAGNOSIS — Z124 Encounter for screening for malignant neoplasm of cervix: Secondary | ICD-10-CM

## 2021-04-30 NOTE — Patient Instructions (Signed)
SHINGLES, TDAP and pneumonia to get at your local pharmacy ? ?To schedule mammogram and bone density -[phone] ? ? ?

## 2021-05-17 DIAGNOSIS — H2511 Age-related nuclear cataract, right eye: Secondary | ICD-10-CM | POA: Diagnosis not present

## 2021-05-17 DIAGNOSIS — H25043 Posterior subcapsular polar age-related cataract, bilateral: Secondary | ICD-10-CM | POA: Diagnosis not present

## 2021-05-17 DIAGNOSIS — H25013 Cortical age-related cataract, bilateral: Secondary | ICD-10-CM | POA: Diagnosis not present

## 2021-05-17 DIAGNOSIS — H2513 Age-related nuclear cataract, bilateral: Secondary | ICD-10-CM | POA: Diagnosis not present

## 2021-05-17 DIAGNOSIS — H18413 Arcus senilis, bilateral: Secondary | ICD-10-CM | POA: Diagnosis not present

## 2021-07-30 ENCOUNTER — Other Ambulatory Visit: Payer: Self-pay | Admitting: Nurse Practitioner

## 2021-07-30 DIAGNOSIS — F419 Anxiety disorder, unspecified: Secondary | ICD-10-CM

## 2021-09-06 ENCOUNTER — Other Ambulatory Visit: Payer: Self-pay | Admitting: Nurse Practitioner

## 2021-09-06 DIAGNOSIS — F419 Anxiety disorder, unspecified: Secondary | ICD-10-CM

## 2021-09-16 ENCOUNTER — Other Ambulatory Visit: Payer: Self-pay | Admitting: Nurse Practitioner

## 2021-09-16 DIAGNOSIS — I10 Essential (primary) hypertension: Secondary | ICD-10-CM

## 2021-10-17 DIAGNOSIS — H2511 Age-related nuclear cataract, right eye: Secondary | ICD-10-CM | POA: Diagnosis not present

## 2021-10-18 ENCOUNTER — Ambulatory Visit (INDEPENDENT_AMBULATORY_CARE_PROVIDER_SITE_OTHER): Payer: Medicare HMO | Admitting: Nurse Practitioner

## 2021-10-18 ENCOUNTER — Encounter: Payer: Medicare HMO | Admitting: Nurse Practitioner

## 2021-10-18 ENCOUNTER — Encounter: Payer: Self-pay | Admitting: Nurse Practitioner

## 2021-10-18 DIAGNOSIS — Z1211 Encounter for screening for malignant neoplasm of colon: Secondary | ICD-10-CM | POA: Diagnosis not present

## 2021-10-18 DIAGNOSIS — Z1231 Encounter for screening mammogram for malignant neoplasm of breast: Secondary | ICD-10-CM | POA: Diagnosis not present

## 2021-10-18 DIAGNOSIS — Z0000 Encounter for general adult medical examination without abnormal findings: Secondary | ICD-10-CM | POA: Diagnosis not present

## 2021-10-18 DIAGNOSIS — F17200 Nicotine dependence, unspecified, uncomplicated: Secondary | ICD-10-CM

## 2021-10-18 DIAGNOSIS — H2512 Age-related nuclear cataract, left eye: Secondary | ICD-10-CM | POA: Diagnosis not present

## 2021-10-18 DIAGNOSIS — Z1212 Encounter for screening for malignant neoplasm of rectum: Secondary | ICD-10-CM

## 2021-10-18 DIAGNOSIS — E2839 Other primary ovarian failure: Secondary | ICD-10-CM

## 2021-10-18 NOTE — Patient Instructions (Signed)
[name] , Thank you for taking time to come for your Medicare Wellness Visit. I appreciate your ongoing commitment to your health goals. Please review the following plan we discussed and let me know if I can assist you in the future.   Screening recommendations/referrals: Colonoscopy- to complete cologuard  Mammogram ordered today to call [phone]  Bone Density [phone]  Recommended yearly ophthalmology/optometry visit for glaucoma screening and checkup Recommended yearly dental visit for hygiene and checkup  Vaccinations: Influenza vaccine- due annually in September/October Pneumococcal vaccine DUE- recommend to get at your local pharmacy Tdap vaccine DUE- recommend to get at your local pharmacy Shingles vaccine DUE- recommend to get at your local pharmacy    Advanced directives: recommended to complete.   Conditions/risks identified: smoking  Next appointment: yearly- next in person    Preventive Care 66 Years and Older, Female Preventive care refers to lifestyle choices and visits with your health care provider that can promote health and wellness. What does preventive care include? A yearly physical exam. This is also called an annual well check. Dental exams once or twice a year. Routine eye exams. Ask your health care provider how often you should have your eyes checked. Personal lifestyle choices, including: Daily care of your teeth and gums. Regular physical activity. Eating a healthy diet. Avoiding tobacco and drug use. Limiting alcohol use. Practicing safe sex. Taking low-dose aspirin every day. Taking vitamin and mineral supplements as recommended by your health care provider. What happens during an annual well check? The services and screenings done by your health care provider during your annual well check will depend on your age, overall health, lifestyle risk factors, and family history of disease. Counseling  Your health care provider may ask you  questions about your: Alcohol use. Tobacco use. Drug use. Emotional well-being. Home and relationship well-being. Sexual activity. Eating habits. History of falls. Memory and ability to understand (cognition). Work and work Statistician. Reproductive health. Screening  You may have the following tests or measurements: Height, weight, and BMI. Blood pressure. Lipid and cholesterol levels. These may be checked every 5 years, or more frequently if you are over 66 years old. Skin check. Lung cancer screening. You may have this screening every year starting at age 71 if you have a 30-pack-year history of smoking and currently smoke or have quit within the past 15 years. Fecal occult blood test (FOBT) of the stool. You may have this test every year starting at age 47. Flexible sigmoidoscopy or colonoscopy. You may have a sigmoidoscopy every 5 years or a colonoscopy every 10 years starting at age 41. Hepatitis C blood test. Hepatitis B blood test. Sexually transmitted disease (STD) testing. Diabetes screening. This is done by checking your blood sugar (glucose) after you have not eaten for a while (fasting). You may have this done every 1-3 years. Bone density scan. This is done to screen for osteoporosis. You may have this done starting at age 85. Mammogram. This may be done every 1-2 years. Talk to your health care provider about how often you should have regular mammograms. Talk with your health care provider about your test results, treatment options, and if necessary, the need for more tests. Vaccines  Your health care provider may recommend certain vaccines, such as: Influenza vaccine. This is recommended every year. Tetanus, diphtheria, and acellular pertussis (Tdap, Td) vaccine. You may need a Td booster every 10 years. Zoster vaccine. You may need this after age 101. Pneumococcal 13-valent conjugate (PCV13) vaccine. One  dose is recommended after age 42. Pneumococcal polysaccharide  (PPSV23) vaccine. One dose is recommended after age 40. Talk to your health care provider about which screenings and vaccines you need and how often you need them. This information is not intended to replace advice given to you by your health care provider. Make sure you discuss any questions you have with your health care provider. Document Released: 01/20/2015 Document Revised: 09/13/2015 Document Reviewed: 10/25/2014 Elsevier Interactive Patient Education  2017 [place] Prevention in the Home Falls can cause injuries. They can happen to people of all ages. There are many things you can do to make your home safe and to help prevent falls. What can I do on the outside of my home? Regularly fix the edges of walkways and driveways and fix any cracks. Remove anything that might make you trip as you walk through a door, such as a raised step or threshold. Trim any bushes or trees on the path to your home. Use bright outdoor lighting. Clear any walking paths of anything that might make someone trip, such as rocks or tools. Regularly check to see if handrails are loose or broken. Make sure that both sides of any steps have handrails. Any raised decks and porches should have guardrails on the edges. Have any leaves, snow, or ice cleared regularly. Use sand or salt on walking paths during winter. Clean up any spills in your garage right away. This includes oil or grease spills. What can I do in the bathroom? Use night lights. Install grab bars by the toilet and in the tub and shower. Do not use towel bars as grab bars. Use non-skid mats or decals in the tub or shower. If you need to sit down in the shower, use a plastic, non-slip stool. Keep the floor dry. Clean up any water that spills on the floor as soon as it happens. Remove soap buildup in the tub or shower regularly. Attach bath mats securely with double-sided non-slip rug tape. Do not have throw rugs and other things on the  floor that can make you trip. What can I do in the bedroom? Use night lights. Make sure that you have a light by your bed that is easy to reach. Do not use any sheets or blankets that are too big for your bed. They should not hang down onto the floor. Have a firm chair that has side arms. You can use this for support while you get dressed. Do not have throw rugs and other things on the floor that can make you trip. What can I do in the kitchen? Clean up any spills right away. Avoid walking on wet floors. Keep items that you use a lot in easy-to-reach places. If you need to reach something above you, use a strong step stool that has a grab bar. Keep electrical cords out of the way. Do not use floor polish or wax that makes floors slippery. If you must use wax, use non-skid floor wax. Do not have throw rugs and other things on the floor that can make you trip. What can I do with my stairs? Do not leave any items on the stairs. Make sure that there are handrails on both sides of the stairs and use them. Fix handrails that are broken or loose. Make sure that handrails are as long as the stairways. Check any carpeting to make sure that it is firmly attached to the stairs. Fix any carpet that is loose or worn.  Avoid having throw rugs at the top or bottom of the stairs. If you do have throw rugs, attach them to the floor with carpet tape. Make sure that you have a light switch at the top of the stairs and the bottom of the stairs. If you do not have them, ask someone to add them for you. What else can I do to help prevent falls? Wear shoes that: Do not have high heels. Have rubber bottoms. Are comfortable and fit you well. Are closed at the toe. Do not wear sandals. If you use a stepladder: Make sure that it is fully opened. Do not climb a closed stepladder. Make sure that both sides of the stepladder are locked into place. Ask someone to hold it for you, if possible. Clearly mark and make  sure that you can see: Any grab bars or handrails. First and last steps. Where the edge of each step is. Use tools that help you move around (mobility aids) if they are needed. These include: Canes. Walkers. Scooters. Crutches. Turn on the lights when you go into a dark area. Replace any light bulbs as soon as they burn out. Set up your furniture so you have a clear path. Avoid moving your furniture around. If any of your floors are uneven, fix them. If there are any pets around you, be aware of where they are. Review your medicines with your doctor. Some medicines can make you feel dizzy. This can increase your chance of falling. Ask your doctor what other things that you can do to help prevent falls. This information is not intended to replace advice given to you by your health care provider. Make sure you discuss any questions you have with your health care provider. Document Released: 10/20/2008 Document Revised: 06/01/2015 Document Reviewed: 01/28/2014 Elsevier Interactive Patient Education  2017 [employer].

## 2021-10-18 NOTE — Progress Notes (Signed)
This service is provided via telemedicine  No vital signs collected/recorded due to the encounter was a telemedicine visit.   Location of patient (ex: home, work):  Home  Patient consents to a telephone visit:  Yes, see encounter dated 01/19/2021  Location of the provider (ex: office, home):  [place]  Name of any referring provider:  N/A  Names of all persons participating in the telemedicine service and their role in the encounter:  Sherrie Mustache, Nurse Practitioner, Carroll Kinds, CMA, and patient.    Time spent on call:  10 minutes with medical assistant

## 2021-10-18 NOTE — Progress Notes (Signed)
Subjective:   Catherine Gilmore is a 66 y.o. female who presents for Medicare Annual (Subsequent) preventive examination.  Review of Systems     Cardiac Risk Factors include: advanced age (>39mn, >>56women);smoking/ tobacco exposure;hypertension;dyslipidemia     Objective:    There were no vitals filed for this visit. There is no height or weight on file to calculate BMI.     10/18/2021    9:52 AM 04/30/2021   10:27 AM 10/16/2020   11:16 AM 09/25/2020   10:15 AM 09/11/2020   12:33 PM 05/11/2018    7:09 AM 08/14/2016    9:51 AM  Advanced Directives  Does Patient Have a Medical Advance Directive? No No No No No No No  Would patient like information on creating a medical advance directive?  No - Patient declined Yes (MAU/Ambulatory/Procedural Areas - Information given) No - Patient declined  No - Patient declined     Current Medications (verified) Outpatient Encounter Medications as of 10/18/2021  Medication Sig   DULoxetine (CYMBALTA) 60 MG capsule Take 1 capsule (60 mg total) by mouth daily.   LORazepam (ATIVAN) 0.5 MG tablet Take 1 tablet by mouth once daily   losartan (COZAAR) 50 MG tablet TAKE 1 TABLET EVERY DAY   Multiple Vitamin (MULTIVITAMIN) tablet Take 1 tablet by mouth daily.   omeprazole (PRILOSEC) 20 MG capsule Take 1 capsule (20 mg total) by mouth daily.   pravastatin (PRAVACHOL) 20 MG tablet TAKE 1 TABLET EVERY DAY   triamterene-hydrochlorothiazide (MAXZIDE-25) 37.5-25 MG tablet TAKE 1 TABLET EVERY DAY   cetirizine (ZYRTEC) 10 MG tablet Take 10 mg by mouth daily. (Patient not taking: Reported on 10/18/2021)   No facility-administered encounter medications on file as of 10/18/2021.    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   SVT ablation     x2   Family History  Problem Relation Age of Onset   Cancer Mother        lung   Cirrhosis Father    Social History   Socioeconomic History   Marital status: Married    Spouse name: Not on file   Number of children: Not on file   Years of education: Not on file   Highest education level: Not on file  Occupational History   Not on file  Tobacco Use   Smoking status: Every Day    Packs/day: 1.00    Types: Cigarettes    Start date: 04/07/2005   Smokeless tobacco: Never  Vaping Use   Vaping Use: Never used  Substance and Sexual Activity   Alcohol use: Yes    Alcohol/week: 3.0 standard drinks of alcohol    Types: 3 Cans of beer per week   Drug use: No   Sexual activity: Yes  Other Topics Concern   Not on file  Social History Narrative   Not on file  Social Determinants of Health   Financial Resource Strain: Not on file  Food Insecurity: Not on file  Transportation Needs: Not on file  Physical Activity: Not on file  Stress: Not on file  Social Connections: Not on file    Tobacco Counseling Ready to quit: Not Answered Counseling given: Not Answered   Clinical Intake:  Pre-visit preparation completed: Yes  Pain : No/denies pain     BMI - recorded: 28 Nutritional Status: BMI 25 -29 Overweight Diabetes: No  How often do you need to have someone help you when you read instructions, pamphlets, or other written materials from your doctor or pharmacy?: 1 - Never  Diabetic?no         Activities of Daily Living    10/18/2021   10:06 AM  In your present state of health, do you have any difficulty performing the following activities:   Hearing? 0  Vision? 0  Difficulty concentrating or making decisions? 0  Walking or climbing stairs? 0  Dressing or bathing? 0  Doing errands, shopping? 0  Preparing Food and eating ? N  Using the Toilet? N  In the past six months, have you accidently leaked urine? N  Do you have problems with loss of bowel control? N  Managing your Medications? N  Managing your Finances? N  Housekeeping or managing your Housekeeping? N    Patient Care Team: [name], NP as PCP - General (Geriatric Medicine)  Indicate any recent Medical Services you may have received from other than [facility] providers in the past year (date may be approximate).     Assessment:   This is a routine wellness examination for Catherine Gilmore.  Hearing/Vision screen Hearing Screening - Comments:: Patient has some hearing problems  Dietary issues and exercise activities discussed: Current Exercise Habits: The patient does not participate in regular exercise at present   Goals Addressed   None    Depression Screen    10/18/2021    9:49 AM 10/16/2020   11:11 AM 06/18/2019   10:29 AM 09/10/2018   10:33 AM 02/20/2018   10:45 AM 01/28/2018    9:18 AM 09/04/2017   11:10 AM  PHQ 2/9 Scores  PHQ - 2 Score 0 '6 2 1 '$ 0 0 1  PHQ- 9 Score  14 12        Fall Risk    10/18/2021    9:51 AM 04/30/2021   10:27 AM 01/29/2021    9:32 AM 01/19/2021    2:24 PM 10/16/2020   11:11 AM  [place] in the past year? 0 0 0 0 0  Number falls in past yr: 0 0 0 0 0  Injury with Fall? 0 0 0 0 0  Risk for fall due to : No Fall Risks No Fall Risks No Fall Risks No Fall Risks No Fall Risks  Follow up Falls evaluation completed Falls evaluation completed Falls evaluation completed Falls evaluation completed Falls evaluation completed    FALL RISK PREVENTION PERTAINING TO THE HOME:  Any stairs in or around the home? Yes  If so, are there any without handrails? No  Home free of loose throw rugs in walkways, pet beds, electrical  cords, etc? Yes  Adequate lighting in your home to reduce risk of falls? Yes   ASSISTIVE DEVICES UTILIZED TO PREVENT FALLS:  Life alert? No  Use of a cane, walker or w/c? No  Grab bars in the bathroom? No  Shower chair or bench in shower?  No  Elevated toilet seat or a handicapped toilet? No   TIMED UP AND GO:  Was the test performed? No .    Cognitive Function:    10/16/2020   11:43 AM  MMSE - Mini Mental State Exam  Orientation to time 5  Orientation to Place 5  Registration 3  Attention/ Calculation 5  Recall 3  Language- name 2 objects 2  Language- repeat 1  Language- follow 3 step command 3  Language- read & follow direction 1  Write a sentence 1  Copy design 1  Total score 30        10/18/2021    9:53 AM  6CIT Screen  What Year? 0 points  What month? 0 points  What time? 0 points  Count back from 20 0 points  Months in reverse 0 points  Repeat phrase 0 points  Total Score 0 points    Immunizations Immunization History  Administered Date(s) Administered   Influenza Split 01/24/2011, 10/17/2011   Influenza Whole 10/02/2007, 10/05/2008, 11/22/2009   Influenza,trivalent, recombinat, inj, PF 01/24/2011, 10/17/2011   Pneumococcal Polysaccharide-23 08/14/2016   Tdap 01/25/2011    TDAP status: Due, Education has been provided regarding the importance of this vaccine. Advised may receive this vaccine at local pharmacy or Health Dept. Aware to provide a copy of the vaccination record if obtained from local pharmacy or Health Dept. Verbalized acceptance and understanding.  Flu Vaccine status: Declined, Education has been provided regarding the importance of this vaccine but patient still declined. Advised may receive this vaccine at local pharmacy or Health Dept. Aware to provide a copy of the vaccination record if obtained from local pharmacy or Health Dept. Verbalized acceptance and understanding.  Pneumococcal vaccine status: Due, Education has been provided  regarding the importance of this vaccine. Advised may receive this vaccine at local pharmacy or Health Dept. Aware to provide a copy of the vaccination record if obtained from local pharmacy or Health Dept. Verbalized acceptance and understanding.  Covid-19 vaccine status: Information provided on how to obtain vaccines.   Qualifies for Shingles Vaccine? Yes   Zostavax completed No   Shingrix Completed?: No.    Education has been provided regarding the importance of this vaccine. Patient has been advised to call insurance company to determine out of pocket expense if they have not yet received this vaccine. Advised may also receive vaccine at local pharmacy or Health Dept. Verbalized acceptance and understanding.  Screening Tests Health Maintenance  Topic Date Due   Zoster Vaccines- Shingrix (1 of 2) Never done   Fecal DNA (Cologuard)  Never done   MAMMOGRAM  02/03/2011   Pneumonia Vaccine 27+ Years old (2 - PCV) 08/14/2017   DEXA SCAN  Never done   TETANUS/TDAP  01/24/2021   Hepatitis C Screening  Completed   HPV VACCINES  Aged Out   COVID-19 Vaccine  Discontinued    Health Maintenance  Health Maintenance Due  Topic Date Due   Zoster Vaccines- Shingrix (1 of 2) Never done   Fecal DNA (Cologuard)  Never done   MAMMOGRAM  02/03/2011   Pneumonia Vaccine 1+ Years old (2 - PCV) 08/14/2017   DEXA SCAN  Never done   TETANUS/TDAP  01/24/2021    Never had colonoscopy- willing to do cologuard- has at the house encouraged to complete.   Mammogram status: Ordered today. Pt provided with contact info and advised to call to schedule appt.   Bone Density status: Ordered today . Pt provided with contact  info and advised to call to schedule appt.  Lung Cancer Screening: (Low Dose CT Chest recommended if Age 82-80 years, 30 pack-year currently smoking OR have quit w/in 15years.) does qualify.   Lung Cancer Screening Referral: completed.   Additional Screening:  Hepatitis C Screening:  does qualify; Completed 2009  Vision Screening: Recommended annual ophthalmology exams for early detection of glaucoma and other disorders of the eye. Is the patient up to date with their annual eye exam?  Yes  Who is the provider or what is the name of the office in which the patient attends annual eye exams? [facility] If pt is not established with a provider, would they like to be referred to a provider to establish care? No .   Dental Screening: Recommended annual dental exams for proper oral hygiene  Community Resource Referral / Chronic Care Management: CRR required this visit?  No   CCM required this visit?  No      Plan:     I have personally reviewed and noted the following in the patient's chart:   Medical and social history Use of alcohol, tobacco or illicit drugs  Current medications and supplements including opioid prescriptions. Patient is not currently taking opioid prescriptions. Functional ability and status Nutritional status Physical activity Advanced directives List of other physicians Hospitalizations, surgeries, and ER visits in previous 12 months Vitals Screenings to include cognitive, depression, and falls Referrals and appointments  In addition, I have reviewed and discussed with patient certain preventive protocols, quality metrics, and best practice recommendations. A written personalized care plan for preventive services as well as general preventive health recommendations were provided to patient.     [name], NP   10/18/2021    Virtual Visit via Telephone Note  I connected with patient 10/18/21 at 10:40 AM EDT by telephone and verified that I am speaking with the correct person using two identifiers.  Location: Patient: home Provider: [place]   I discussed the limitations, risks, security and privacy concerns of performing an evaluation and management service by telephone and the availability of in person appointments. I also  discussed with the patient that there may be a patient responsible charge related to this service. The patient expressed understanding and agreed to proceed.   I discussed the assessment and treatment plan with the patient. The patient was provided an opportunity to ask questions and all were answered. The patient agreed with the plan and demonstrated an understanding of the instructions.   The patient was advised to call back or seek an in-person evaluation if the symptoms worsen or if the condition fails to improve as anticipated.  I provided 15 minutes of non-face-to-face time during this encounter.  Catherine Gilmore. Catherine Gilmore Avs printed and mailed

## 2021-10-31 DIAGNOSIS — H2512 Age-related nuclear cataract, left eye: Secondary | ICD-10-CM | POA: Diagnosis not present

## 2021-11-01 ENCOUNTER — Other Ambulatory Visit: Payer: Self-pay | Admitting: Nurse Practitioner

## 2021-11-01 DIAGNOSIS — F419 Anxiety disorder, unspecified: Secondary | ICD-10-CM

## 2021-11-01 NOTE — Telephone Encounter (Signed)
Patient is requesting a refill of the following medications: Requested Prescriptions   Pending Prescriptions Disp Refills   LORazepam (ATIVAN) 0.5 MG tablet [Pharmacy Med Name: LORazepam 0.5 MG Oral Tablet] 30 tablet 0    Sig: Take 1 tablet by mouth once daily    Date of last refill: 09/06/21  Refill amount: 30  Treatment agreement date: Outdated, notation made on pending appointment for 11/27/21

## 2021-11-02 ENCOUNTER — Encounter: Payer: Medicare HMO | Admitting: Nurse Practitioner

## 2021-11-02 ENCOUNTER — Other Ambulatory Visit: Payer: Medicare HMO

## 2021-11-02 NOTE — Telephone Encounter (Signed)
[facility], Catherine Gilmore, called and stated that they DO NOT have the Ativan it is on backorder and stated that [facility] does and request that the Rx be sent there instead.   Pended Rx and sent Janett Billow for approval.

## 2021-11-02 NOTE — Addendum Note (Signed)
Addended by: Rafael Bihari A on: 11/02/2021 04:43 PM   Modules accepted: Orders

## 2021-11-05 MED ORDER — LORAZEPAM 0.5 MG PO TABS: 0.5000 mg | ORAL_TABLET | Freq: Every day | ORAL | 3 refills | Status: DC

## 2021-11-23 ENCOUNTER — Encounter: Payer: Medicare HMO | Admitting: Nurse Practitioner

## 2021-11-28 ENCOUNTER — Ambulatory Visit: Payer: Medicare HMO

## 2021-12-16 ENCOUNTER — Other Ambulatory Visit: Payer: Self-pay | Admitting: Nurse Practitioner

## 2021-12-16 DIAGNOSIS — K219 Gastro-esophageal reflux disease without esophagitis: Secondary | ICD-10-CM

## 2022-01-02 ENCOUNTER — Encounter: Payer: Self-pay | Admitting: Family

## 2022-01-02 ENCOUNTER — Telehealth (INDEPENDENT_AMBULATORY_CARE_PROVIDER_SITE_OTHER): Payer: Medicare HMO | Admitting: Family

## 2022-01-02 DIAGNOSIS — F419 Anxiety disorder, unspecified: Secondary | ICD-10-CM

## 2022-01-02 DIAGNOSIS — J0101 Acute recurrent maxillary sinusitis: Secondary | ICD-10-CM | POA: Diagnosis not present

## 2022-01-02 MED ORDER — AZITHROMYCIN 250 MG PO TABS: ORAL_TABLET | ORAL | 0 refills | Status: AC

## 2022-01-02 MED ORDER — DULOXETINE HCL 60 MG PO CPEP: 60.0000 mg | ORAL_CAPSULE | Freq: Every day | ORAL | 1 refills | Status: DC

## 2022-01-02 NOTE — Progress Notes (Signed)
This service is provided via telemedicine  No vital signs collected/recorded due to the encounter was a telemedicine visit.   Location of patient (ex: home, work):  Home  Patient consents to a telephone visit:  Yes  Location of the provider (ex: office, home):  [employer].  Name of any referring provider:  [name], NP   Names of all persons participating in the telemedicine service and their role in the encounter:  Patient, Catherine Gilmore, [place], [place], Webb Silversmith, NP.    Time spent on call:  8 minutes spent on the phone with Medical Assistant.     Provider: Rubby Barbary FNP-C  [name], NP  Patient Care Team: [name], NP as PCP - General (Geriatric Medicine)  Extended Emergency Contact Information Primary Emergency Contact: Robinson,william Mobile Phone: [phone] Relation: Spouse  Code Status: Full Code  Goals of care: Advanced Directive information    01/02/2022    3:25 PM  Advanced Directives  Does Patient Have a Medical Advance Directive? No  Would patient like information on creating a medical advance directive? No - Patient declined     Chief Complaint  Patient presents with   Acute Visit    Patient complains of nasal congestion, forehead pain, throbbing teeth, and coughing.    HPI:  Pt is a 66 y.o. female seen today for an acute visit for evaluation of nasal congestion,forehead pain,cough and throbbing teeth on upper top teeth.started out runny nose.blowing up some Green stuff.also has pain over the face below the eyes down to teeth area on both sides.painful to press.Has taken some Asprin  but did not help. No fever or chills.Husband and neighbor had a cold but getting better. Denies any contact with sick person with COVID-19.  Has had sinus infection in the past.    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   LORazepam (ATIVAN) 0.5 MG tablet Take 1 tablet (0.5 mg total) by mouth daily.   losartan (COZAAR) 50 MG tablet TAKE 1 TABLET EVERY DAY   Multiple Vitamin (MULTIVITAMIN) tablet Take 1 tablet by mouth daily.   omeprazole (PRILOSEC) 20 MG capsule TAKE 1 CAPSULE EVERY DAY   pravastatin (PRAVACHOL) 20 MG tablet TAKE 1 TABLET EVERY DAY   triamterene-hydrochlorothiazide (MAXZIDE-25) 37.5-25 MG tablet TAKE 1 TABLET EVERY DAY   [DISCONTINUED] cetirizine (ZYRTEC) 10 MG tablet Take 10 mg by mouth daily. (Patient not taking: Reported on 10/18/2021)   No facility-administered encounter medications on file as of 01/02/2022.    Review of Systems  Constitutional:  Negative for appetite change, chills, fatigue, fever and unexpected weight change.  HENT:  Positive for congestion, rhinorrhea, sinus pressure and sinus pain. Negative for dental problem, ear discharge, ear pain, facial swelling, nosebleeds, postnasal drip, sneezing, sore throat, tinnitus and trouble swallowing.        Bilateral upper teeth Throbbing   Eyes:  Negative for pain, discharge, redness, itching and visual disturbance.  Respiratory:  Positive for cough. Negative for chest tightness, shortness of breath and wheezing.   Cardiovascular:  Negative for chest  pain, palpitations and leg swelling.  Gastrointestinal:  Negative for abdominal distention, abdominal pain, diarrhea, nausea and vomiting.  Neurological:  Positive for headaches. Negative for dizziness and light-headedness.  Psychiatric/Behavioral:  Negative for agitation, behavioral problems, confusion, hallucinations and sleep disturbance. The patient is not nervous/anxious.     Immunization History  Administered Date(s) Administered   Influenza Split 01/24/2011, 10/17/2011   Influenza Whole 10/02/2007, 10/05/2008, 11/22/2009   Influenza,trivalent, recombinat, inj, PF 01/24/2011, 10/17/2011   Pneumococcal Polysaccharide-23 08/14/2016   Tdap 01/25/2011   Pertinent  Health Maintenance Due  Topic Date Due   MAMMOGRAM  02/03/2011   DEXA SCAN  Never done      01/19/2021    2:24 PM 01/29/2021    9:32 AM 04/30/2021   10:27 AM 10/18/2021    9:51 AM 01/02/2022    3:25 PM  Fall Risk  Falls in the past year? 0 0 0 0 0  Was there an injury with Fall? 0 0 0 0 0  Fall Risk Category Calculator 0 0 0 0 0  Fall Risk Category Low Low Low Low Low  Patient Fall Risk Level Low fall risk Low fall risk Low fall risk Low fall risk Low fall risk  Patient at Risk for Falls Due to No Fall Risks No Fall Risks No Fall Risks No Fall Risks No Fall Risks  Fall risk Follow up Falls evaluation completed Falls evaluation completed Falls evaluation completed Falls evaluation completed Falls evaluation completed   Functional Status Survey:    There were no vitals filed for this visit. There is no height or weight on file to calculate BMI. Physical Exam Constitutional:      General: She is not in acute distress.    Appearance: She is not ill-appearing.  Pulmonary:     Effort: Respiratory distress present.  Neurological:     Mental Status: She is alert and oriented to person, place, and time.    Labs reviewed: Recent Labs    01/29/21 1008  NA 138  K 4.1  CL 106  CO2 29  GLUCOSE 94  BUN 11   CREATININE 0.79  CALCIUM 9.2   Recent Labs    01/29/21 1008  AST 18  ALT 19  BILITOT 0.5  PROT 6.0*   Recent Labs    01/29/21 1008  WBC 10.4  NEUTROABS 7,342  HGB 14.4  HCT 42.5  MCV 90.8  PLT 336   Lab Results  Component Value Date   TSH 2.56 09/25/2020   Lab Results  Component Value Date   HGBA1C 5.4 03/12/2019   Lab Results  Component Value Date   CHOL 203 (H) 09/25/2020   HDL 71 09/25/2020   LDLCALC 106 (H) 09/25/2020   TRIG 144 09/25/2020   CHOLHDL 2.9 09/25/2020    Significant Diagnostic Results in last 30 days:  No results found.  Assessment/Plan 1. Anxiety Stable  Request refill on Cymbalta  - DULoxetine (CYMBALTA) 60 MG capsule; Take  1 capsule (60 mg total) by mouth daily.  Dispense: 90 capsule; Refill: 1  2. Acute recurrent maxillary sinusitis Afebrile  Reports worsening sinus pressure,pain,bilat.upper teeth throbbing,runny nose,cough and nasal congestion. - Encouraged to use nasal saline rinse  Has had similar sinus infection in the past responded well to z-pak. Will start on Z-pak as below.Encouraged to take probiotic or eat Yogurt.    - azithromycin (ZITHROMAX) 250 MG tablet; Take 2 tablets on day 1, then 1 tablet daily on days 2 through 5  Dispense: 6 tablet; Refill: 0 - Notify provider if symptoms worsen or fail to improved  Family/ staff Communication: Reviewed plan of care with patient verbalize understanding  Labs/tests ordered: None   Next Appointment: Return if symptoms worsen or fail to improve.  I connected with  Catherine Gilmore on 01/02/22 by a video enabled telemedicine application and verified that I am speaking with the correct person using two identifiers.   I discussed the limitations of evaluation and management by telemedicine. The patient expressed understanding and agreed to proceed.  Spent 11 minutes of face to face with patient  >50% time spent counseling; reviewing medical record; labs; and developing future plan of  care.  Sandrea Hughs, NP

## 2022-02-10 ENCOUNTER — Other Ambulatory Visit: Payer: Self-pay | Admitting: Nurse Practitioner

## 2022-02-10 DIAGNOSIS — I10 Essential (primary) hypertension: Secondary | ICD-10-CM

## 2022-03-29 ENCOUNTER — Ambulatory Visit
Admission: RE | Admit: 2022-03-29 | Discharge: 2022-03-29 | Disposition: A | Discharge status: Home / Self Care | Payer: Medicare HMO | Source: Ambulatory Visit | Attending: Nurse Practitioner | Admitting: Nurse Practitioner

## 2022-03-29 ENCOUNTER — Other Ambulatory Visit: Payer: Self-pay

## 2022-03-29 DIAGNOSIS — E2839 Other primary ovarian failure: Secondary | ICD-10-CM

## 2022-03-29 DIAGNOSIS — M85851 Other specified disorders of bone density and structure, right thigh: Secondary | ICD-10-CM | POA: Diagnosis not present

## 2022-03-29 DIAGNOSIS — M8580 Other specified disorders of bone density and structure, unspecified site: Secondary | ICD-10-CM

## 2022-03-29 DIAGNOSIS — Z780 Asymptomatic menopausal state: Secondary | ICD-10-CM | POA: Diagnosis not present

## 2022-03-29 MED ORDER — CALCIUM CARBONATE 600 MG PO TABS: 600.0000 mg | ORAL_TABLET | Freq: Two times a day (BID) | ORAL | 11 refills | Status: DC

## 2022-03-29 MED ORDER — VITAMIN D3 50 MCG (2000 UT) PO CAPS: 2000.0000 [IU] | ORAL_CAPSULE | Freq: Every day | ORAL | 11 refills | Status: DC

## 2022-04-14 IMAGING — DX DG CHEST 1V PORT
1 series · 1 of 1 positions shown · non-contrast
Comparison: Two-view chest x-ray 05/01/2007

CLINICAL DATA: Shortness of breath.  Chest pain.

EXAM:
PORTABLE CHEST 1 VIEW

[chest ap]
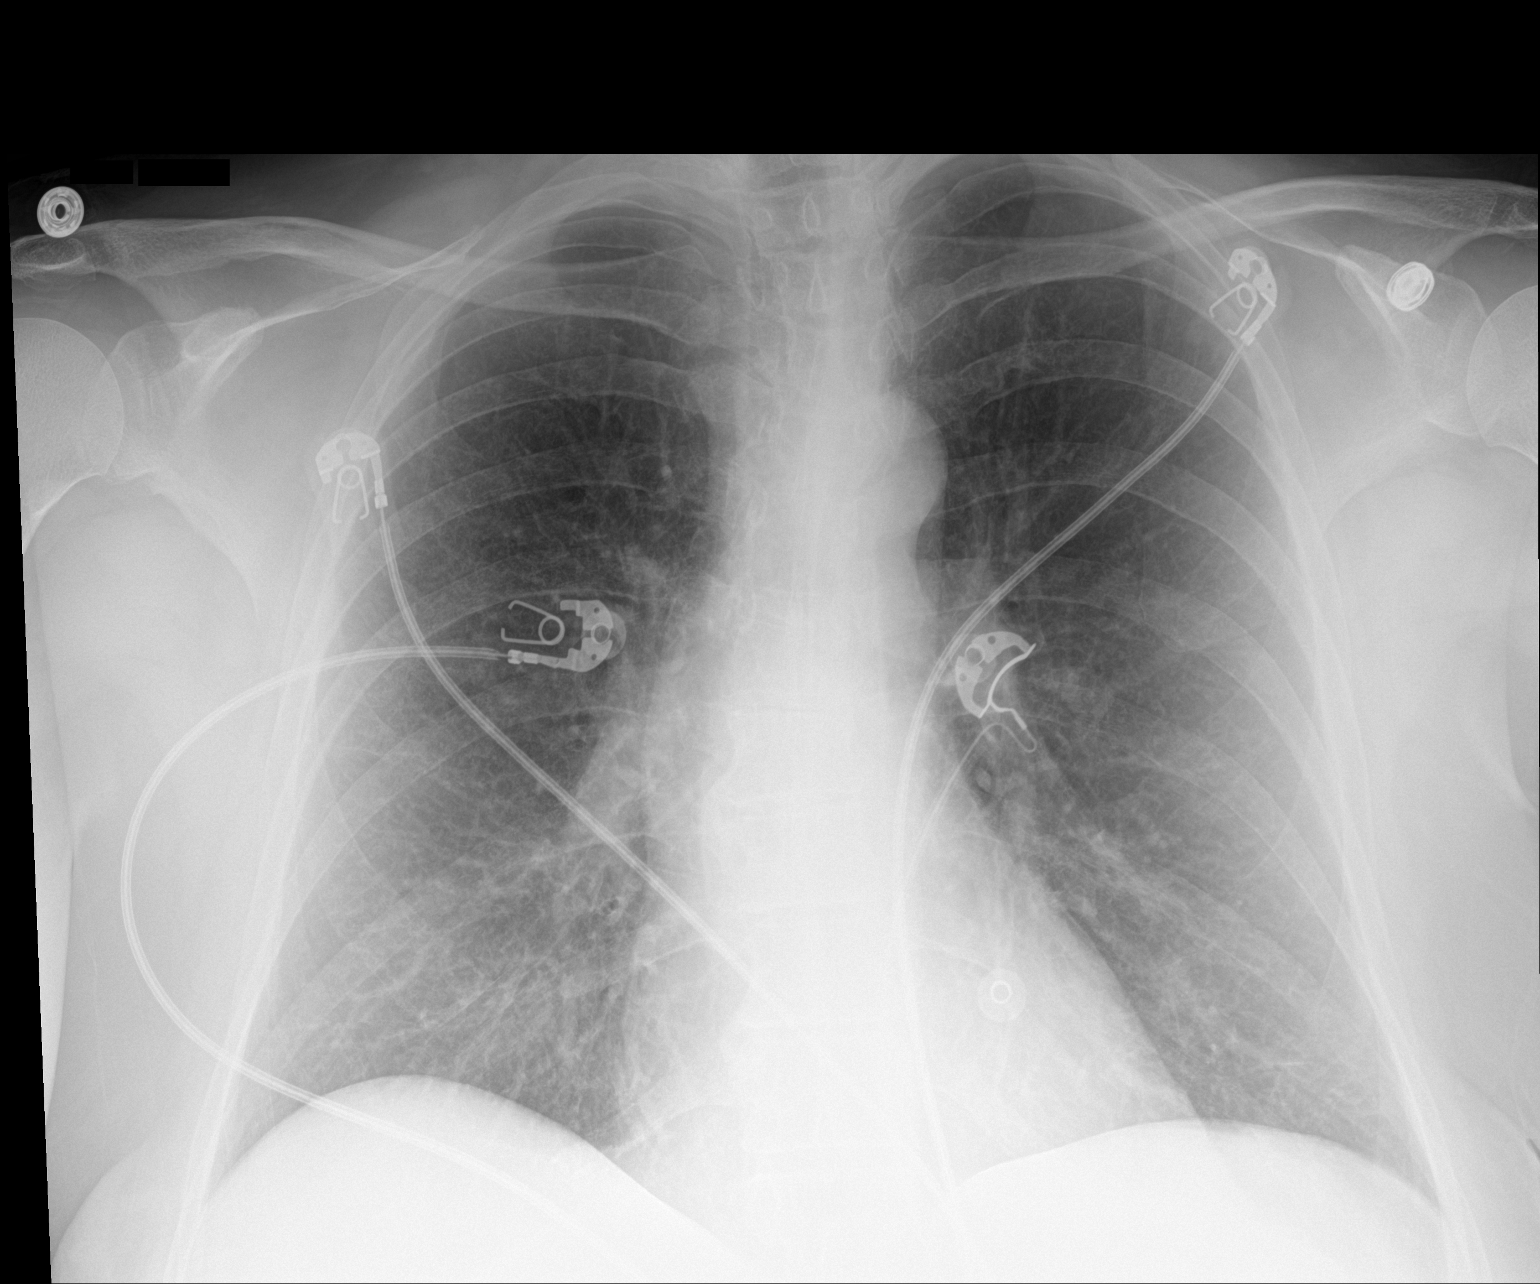

[1 of 1 positions shown; findings below may reference images not displayed]

FINDINGS: Heart size is normal. Mild interstitial coarsening is present. No
focal airspace disease is present. No edema or effusion is present.
IMPRESSION: No acute cardiopulmonary disease.

## 2022-05-12 ENCOUNTER — Other Ambulatory Visit: Payer: Self-pay | Admitting: Nurse Practitioner

## 2022-05-12 DIAGNOSIS — K219 Gastro-esophageal reflux disease without esophagitis: Secondary | ICD-10-CM

## 2022-05-15 ENCOUNTER — Ambulatory Visit
Admission: RE | Admit: 2022-05-15 | Discharge: 2022-05-15 | Disposition: A | Discharge status: Home / Self Care | Payer: Medicare HMO | Source: Ambulatory Visit | Attending: Nurse Practitioner | Admitting: Nurse Practitioner

## 2022-05-15 DIAGNOSIS — Z1231 Encounter for screening mammogram for malignant neoplasm of breast: Secondary | ICD-10-CM

## 2022-05-26 DIAGNOSIS — B379 Candidiasis, unspecified: Secondary | ICD-10-CM | POA: Diagnosis not present

## 2022-06-17 ENCOUNTER — Encounter: Payer: Self-pay | Admitting: Nurse Practitioner

## 2022-06-17 ENCOUNTER — Ambulatory Visit (INDEPENDENT_AMBULATORY_CARE_PROVIDER_SITE_OTHER): Payer: Medicare HMO | Admitting: Nurse Practitioner

## 2022-06-17 VITALS — BP 138/80 | HR 87 | Temp 98.4°F | Resp 18 | Ht 64.0 in | Wt 164.4 lb

## 2022-06-17 DIAGNOSIS — B372 Candidiasis of skin and nail: Secondary | ICD-10-CM | POA: Diagnosis not present

## 2022-06-17 DIAGNOSIS — R7309 Other abnormal glucose: Secondary | ICD-10-CM | POA: Diagnosis not present

## 2022-06-17 DIAGNOSIS — I10 Essential (primary) hypertension: Secondary | ICD-10-CM

## 2022-06-17 DIAGNOSIS — B379 Candidiasis, unspecified: Secondary | ICD-10-CM | POA: Diagnosis not present

## 2022-06-17 DIAGNOSIS — E785 Hyperlipidemia, unspecified: Secondary | ICD-10-CM | POA: Diagnosis not present

## 2022-06-17 DIAGNOSIS — F17200 Nicotine dependence, unspecified, uncomplicated: Secondary | ICD-10-CM

## 2022-06-17 MED ORDER — NYSTATIN-TRIAMCINOLONE 100000-0.1 UNIT/GM-% EX OINT: 1.0000 | TOPICAL_OINTMENT | Freq: Two times a day (BID) | CUTANEOUS | 0 refills | Status: DC

## 2022-06-17 MED ORDER — FLUCONAZOLE 150 MG PO TABS: ORAL_TABLET | ORAL | 0 refills | Status: DC

## 2022-06-17 NOTE — Progress Notes (Signed)
Careteam: Patient Care Team: Sharon Seller, NP as PCP - General (Geriatric Medicine)  PLACE OF SERVICE:  [facility]  Advanced Directive information    Allergies  Allergen Reactions   Amoxicillin     Causes a bad yeast infection    Lisinopril     REACTION: Cough    Chief Complaint  Patient presents with   Acute Visit    Patient is here for rash on upper thigh    Medical Management of Chronic Issues     HPI: Patient is a 67 y.o. female for ongoing rash She went to urgent care for rash and was diagnosed with yeast on 05/26/22. She has completed course fluconazole. It was doing better but now feels like she has it again.  2 days ago, started itching areas. Had red raised area under arms, breast and groin.  Feels like she is itching area and making more irritated.   Has cut back on her smoking.   Review of Systems:  Review of Systems  Constitutional:  Negative for chills, fever and weight loss.  HENT:  Negative for tinnitus.   Respiratory:  Negative for cough, sputum production and shortness of breath.   Cardiovascular:  Negative for chest pain, palpitations and leg swelling.  Gastrointestinal:  Negative for abdominal pain, constipation, diarrhea and heartburn.  Genitourinary:  Negative for dysuria, frequency and urgency.  Musculoskeletal:  Negative for back pain, falls, joint pain and myalgias.  Skin:  Positive for itching and rash.  Neurological:  Negative for dizziness and headaches.  Psychiatric/Behavioral:  Negative for depression and memory loss. The patient does not have insomnia.     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 She started smoking about 17 years ago. She has been smoking an average of 1 pack per day. She has never used smokeless tobacco. She reports current alcohol use of about 3.0 standard drinks of alcohol per week. She reports that she does not use drugs.  Family History  Problem Relation Age of Onset   Cancer Mother        lung   Cirrhosis Father     Medications: Patient's Medications  New Prescriptions   No medications on file  Previous Medications   CALCIUM CARBONATE (CALCIUM 600) 600 MG TABS TABLET    Take 1 tablet (600 mg total) by mouth 2 (two) times daily with a meal.   CHOLECALCIFEROL (VITAMIN D3) 50 MCG (2000 UT) CAPS    Take 1 capsule (2,000 Units total) by mouth daily.   DULOXETINE (CYMBALTA) 60 MG CAPSULE    Take 1 capsule (60 mg total) by mouth daily.   LORAZEPAM (ATIVAN) 0.5 MG TABLET    Take 1 tablet (0.5 mg total) by mouth daily.   LOSARTAN (COZAAR) 50 MG TABLET  TAKE 1 TABLET EVERY DAY   MULTIPLE VITAMIN (MULTIVITAMIN) TABLET    Take 1 tablet by mouth daily.   OMEPRAZOLE (PRILOSEC) 20 MG CAPSULE    TAKE 1 CAPSULE EVERY DAY   PRAVASTATIN (PRAVACHOL) 20 MG TABLET    TAKE 1 TABLET EVERY DAY   TRIAMTERENE-HYDROCHLOROTHIAZIDE (MAXZIDE-25) 37.5-25 MG TABLET    TAKE 1 TABLET EVERY DAY  Modified Medications   No medications on file  Discontinued Medications   No medications on file    Physical Exam:  Vitals:   06/17/22 1449 06/17/22 1553  BP: (!) 140/80 138/80  Pulse: 87   Resp: 18   Temp: 98.4 F (36.9 C)   SpO2: 99%   Weight: 164 lb 6.4 oz (74.6 kg)   Height: 5\' 4"  (1.626 m)    Body mass index is 28.22 kg/m. Wt Readings from Last 3 Encounters:  06/17/22 164 lb 6.4 oz (74.6 kg)  04/30/21 167 lb 12.8 oz (76.1 kg)   01/29/21 171 lb (77.6 kg)    Physical Exam Constitutional:      General: She is not in acute distress.    Appearance: She is well-developed. She is not diaphoretic.  HENT:     Head: Normocephalic and atraumatic.     Mouth/Throat:     Pharynx: No oropharyngeal exudate.  Eyes:     Conjunctiva/sclera: Conjunctivae normal.     Pupils: Pupils are equal, round, and reactive to light.  Cardiovascular:     Rate and Rhythm: Normal rate and regular rhythm.     Heart sounds: Normal heart sounds.  Pulmonary:     Effort: Pulmonary effort is normal.     Breath sounds: Normal breath sounds.  Abdominal:     General: Bowel sounds are normal.     Palpations: Abdomen is soft.  Musculoskeletal:     Cervical back: Normal range of motion and neck supple.     Right lower leg: No edema.     Left lower leg: No edema.  Skin:    General: Skin is warm and dry.     Comments: Beefy red rash noted to bilateral groin area, bilateral underarms and under right breast    Neurological:     Mental Status: She is alert.  Psychiatric:        Mood and Affect: Mood normal.     Labs reviewed: Basic Metabolic Panel: No results for input(s): "NA", "K", "CL", "CO2", "GLUCOSE", "BUN", "CREATININE", "CALCIUM", "MG", "PHOS", "TSH" in the last 8760 hours. Liver Function Tests: No results for input(s): "AST", "ALT", "ALKPHOS", "BILITOT", "PROT", "ALBUMIN" in the last 8760 hours. No results for input(s): "LIPASE", "AMYLASE" in the last 8760 hours. No results for input(s): "AMMONIA" in the last 8760 hours. CBC: No results for input(s): "WBC", "NEUTROABS", "HGB", "HCT", "MCV", "PLT" in the last 8760 hours. Lipid Panel: No results for input(s): "CHOL", "HDL", "LDLCALC", "TRIG", "CHOLHDL", "LDLDIRECT" in the last 8760 hours. TSH: No results for input(s): "TSH" in the last 8760 hours. A1C: Lab Results  Component Value Date   HGBA1C 5.4 03/12/2019     Assessment/Plan 1. Hyperlipidemia LDL goal <100 Has been  prescribed pravastatin but has not been refilled in over a year - Lipid panel - COMPLETE METABOLIC PANEL WITH GFR  2. Smoker -encouraged cessation   3. Essential hypertension -Blood pressure well controlled, goal bp <140/90 Continue current medications and dietary modifications follow metabolic panel - COMPLETE METABOLIC PANEL WITH GFR - CBC with Differential/Platelet  4. Yeast infection of the skin -recurrent.  Very itchy.  -to wash and dry area thoroughly BID and apply cream  - COMPLETE METABOLIC PANEL WITH GFR - Hemoglobin A1c - nystatin-triamcinolone ointment (MYCOLOG); Apply 1 Application topically 2 (two) times daily.  Dispense: 30 g; Refill: 0 - fluconazole (DIFLUCAN) 150 MG tablet; To take diflucan 150 mg by mouth every 3 days for 3 doses then once weekly for 4 weeks  Dispense: 7 tablet; Refill: 0 -strict follow up precautions given.   Return in about 3 months (around 09/17/2022) for routine follow up.  Janene Harvey. Biagio Borg [facility] [phone]

## 2022-06-17 NOTE — Patient Instructions (Addendum)
To use cream twice daily- wash area- dry thoroughly- use hairdryer then  use cream   To take diflucan 150 mg by mouth every 3 days for 3 doses then once weekly for 4 weeks

## 2022-06-18 LAB — COMPLETE METABOLIC PANEL WITH GFR
AG Ratio: 1.8 (calc) (ref 1.0–2.5)
ALT: 23 U/L (ref 6–29)
AST: 20 U/L (ref 10–35)
Albumin: 4.3 g/dL (ref 3.6–5.1)
Alkaline phosphatase (APISO): 55 U/L (ref 37–153)
BUN: 14 mg/dL (ref 7–25)
CO2: 26 mmol/L (ref 20–32)
Calcium: 9.7 mg/dL (ref 8.6–10.4)
Chloride: 104 mmol/L (ref 98–110)
Creat: 0.84 mg/dL (ref 0.50–1.05)
Globulin: 2.4 g/dL (calc) (ref 1.9–3.7)
Glucose, Bld: 100 mg/dL — ABNORMAL HIGH (ref 65–99)
Potassium: 3.7 mmol/L (ref 3.5–5.3)
Sodium: 140 mmol/L (ref 135–146)
Total Bilirubin: 0.4 mg/dL (ref 0.2–1.2)
Total Protein: 6.7 g/dL (ref 6.1–8.1)
eGFR: 76 mL/min/{1.73_m2} (ref >=60)

## 2022-06-18 LAB — CBC WITH DIFFERENTIAL/PLATELET
Absolute Monocytes: 498 cells/uL (ref 200–950)
Basophils Absolute: 53 cells/uL (ref 0–200)
Basophils Relative: 0.6 %
Eosinophils Absolute: 276 cells/uL (ref 15–500)
Eosinophils Relative: 3.1 %
HCT: 42.1 % (ref 35.0–45.0)
Hemoglobin: 14.5 g/dL (ref 11.7–15.5)
Lymphs Abs: 2483 cells/uL (ref 850–3900)
MCH: 31.9 pg (ref 27.0–33.0)
MCHC: 34.4 g/dL (ref 32.0–36.0)
MCV: 92.7 fL (ref 80.0–100.0)
MPV: 9 fL (ref 7.5–12.5)
Monocytes Relative: 5.6 %
Neutro Abs: 5589 cells/uL (ref 1500–7800)
Neutrophils Relative %: 62.8 %
Platelets: 328 10*3/uL (ref 140–400)
RBC: 4.54 10*6/uL (ref 3.80–5.10)
RDW: 12.4 % (ref 11.0–15.0)
Total Lymphocyte: 27.9 %
WBC: 8.9 10*3/uL (ref 3.8–10.8)

## 2022-06-18 LAB — LIPID PANEL
Cholesterol: 222 mg/dL — ABNORMAL HIGH (ref <200)
HDL: 85 mg/dL (ref >=50)
LDL Cholesterol (Calc): 96 mg/dL (calc)
Non-HDL Cholesterol (Calc): 137 mg/dL (calc) — ABNORMAL HIGH (ref <130)
Total CHOL/HDL Ratio: 2.6 (calc) (ref <5.0)
Triglycerides: 305 mg/dL — ABNORMAL HIGH (ref <150)

## 2022-06-18 LAB — HEMOGLOBIN A1C
Hgb A1c MFr Bld: 5.8 % of total Hgb — ABNORMAL HIGH (ref <5.7)
Mean Plasma Glucose: 120 mg/dL
eAG (mmol/L): 6.6 mmol/L

## 2022-06-29 DIAGNOSIS — L409 Psoriasis, unspecified: Secondary | ICD-10-CM | POA: Diagnosis not present

## 2022-06-29 DIAGNOSIS — L259 Unspecified contact dermatitis, unspecified cause: Secondary | ICD-10-CM | POA: Diagnosis not present

## 2022-07-01 ENCOUNTER — Telehealth: Payer: Self-pay | Admitting: Nurse Practitioner

## 2022-07-01 NOTE — Telephone Encounter (Signed)
Will need to request this documentation, please have her sign a record release

## 2022-07-01 NOTE — Telephone Encounter (Signed)
Patient called requesting referral to [facility] ([address] [place], [place], [place]). Patient was seen at Urgent Care Saturday, 06/29/22, and diagnosed with eczema and psoriasis. Was given a cream and prednisone, and was told by them to get a referral to a dermatologist.

## 2022-07-07 DIAGNOSIS — L03119 Cellulitis of unspecified part of limb: Secondary | ICD-10-CM | POA: Diagnosis not present

## 2022-07-07 DIAGNOSIS — L03314 Cellulitis of groin: Secondary | ICD-10-CM | POA: Diagnosis not present

## 2022-07-08 DIAGNOSIS — L308 Other specified dermatitis: Secondary | ICD-10-CM | POA: Diagnosis not present

## 2022-07-08 DIAGNOSIS — L408 Other psoriasis: Secondary | ICD-10-CM | POA: Diagnosis not present

## 2022-07-11 DIAGNOSIS — L509 Urticaria, unspecified: Secondary | ICD-10-CM | POA: Diagnosis not present

## 2022-07-12 ENCOUNTER — Ambulatory Visit (INDEPENDENT_AMBULATORY_CARE_PROVIDER_SITE_OTHER): Payer: Medicare HMO | Admitting: Adult Health

## 2022-07-12 ENCOUNTER — Encounter: Payer: Self-pay | Admitting: Adult Health

## 2022-07-12 VITALS — BP 132/78 | HR 84 | Temp 97.4°F | Resp 18 | Ht 64.0 in | Wt 162.2 lb

## 2022-07-12 DIAGNOSIS — L03116 Cellulitis of left lower limb: Secondary | ICD-10-CM | POA: Diagnosis not present

## 2022-07-12 DIAGNOSIS — I10 Essential (primary) hypertension: Secondary | ICD-10-CM

## 2022-07-12 DIAGNOSIS — F341 Dysthymic disorder: Secondary | ICD-10-CM | POA: Diagnosis not present

## 2022-07-12 DIAGNOSIS — T7840XS Allergy, unspecified, sequela: Secondary | ICD-10-CM | POA: Diagnosis not present

## 2022-07-12 MED ORDER — SACCHAROMYCES BOULARDII 250 MG PO CAPS
250.0000 mg | ORAL_CAPSULE | Freq: Two times a day (BID) | ORAL | 0 refills | Status: AC
Start: 2022-07-12 — End: 2022-07-29

## 2022-07-12 MED ORDER — DOXYCYCLINE HYCLATE 100 MG PO TABS
100.00 mg | ORAL_TABLET | Freq: Two times a day (BID) | ORAL | 0 refills | Status: AC
Start: 2022-07-12 — End: 2022-07-26

## 2022-07-12 NOTE — Patient Instructions (Signed)

## 2022-07-12 NOTE — Progress Notes (Signed)
[facility]  Provider:  Kenard Gower DNP  Code Status:  Full Code  Goals of Care:     07/12/2022    9:10 AM  Advanced Directives  Does Patient Have a Medical Advance Directive? No  Would patient like information on creating a medical advance directive? No - Patient declined     Chief Complaint  Patient presents with   Acute Visit    Packing of an abscess    HPI: Patient is a 67 y.o. female seen today for an acute visit for packing of an abscess.    Started with a yeast, scratching probably due to scratching then started noticing the abscess left thigh  became pink and hot, 5 days later went to urgent care and I/D done. She was prescribed Bactrim DS X 10 days. She started developing rashes on her back and right lower extremity. So she went back to urgent care on  07/09/22 and was told to discontinue Bactrim, was given steroid injection and Prednisone taper pack.   Today, left inner leg wound, measuring  1 cm X 1 cmm has clear drainage. Dressing has dry drainage. No erythema  and has clear drainage. Wound tissue is reddish and has induration. No pus noted. She denies fever nor chills.   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   Cholecalciferol (VITAMIN D3) 50 MCG (2000 UT) CAPS Take 1 capsule (2,000 Units total) by mouth daily.   DULoxetine (CYMBALTA) 60 MG capsule Take 1 capsule (60 mg total) by mouth daily.   fluconazole (DIFLUCAN) 150 MG tablet To take diflucan 150 mg by mouth every 3 days for 3 doses then once weekly for 4 weeks   LORazepam (ATIVAN) 0.5 MG tablet Take 1 tablet (0.5 mg total) by mouth daily.   losartan (COZAAR) 50 MG tablet TAKE 1 TABLET EVERY DAY   Multiple Vitamin (MULTIVITAMIN) tablet Take 1 tablet by mouth daily.   nystatin-triamcinolone ointment (MYCOLOG) Apply 1 Application topically 2 (two) times daily.   omeprazole (PRILOSEC) 20 MG capsule TAKE 1 CAPSULE EVERY DAY   pravastatin (PRAVACHOL) 20 MG tablet TAKE 1 TABLET EVERY DAY   triamterene-hydrochlorothiazide (MAXZIDE-25) 37.5-25 MG tablet TAKE 1 TABLET EVERY DAY   No facility-administered encounter medications on file as of 07/12/2022.    Review of Systems:  Review of Systems  Constitutional:  Negative for appetite change, chills, fatigue and fever.  HENT:  Negative for congestion, hearing loss, rhinorrhea and sore throat.   Eyes: Negative.  Respiratory:  Negative for cough, shortness of breath and wheezing.   Cardiovascular:  Negative for chest pain, palpitations and leg swelling.  Gastrointestinal:  Negative for abdominal pain, constipation, diarrhea, nausea and vomiting.  Genitourinary:  Negative for dysuria.  Musculoskeletal:  Negative for arthralgias, back pain and myalgias.  Skin:  Positive for rash and wound. Negative for color change.  Neurological:  Negative for dizziness, weakness and headaches.  Psychiatric/Behavioral:  Negative for behavioral problems. The patient is not nervous/anxious.     Health Maintenance  Topic Date Due   Zoster Vaccines- Shingrix (1 of 2) Never done   Fecal DNA (Cologuard)  Never done   Pneumonia Vaccine 70+ Years old (2  of 2 - PCV) 08/14/2017   DTaP/Tdap/Td (2 - Td or Tdap) 01/24/2021   Medicare Annual Wellness (AWV)  10/19/2022   MAMMOGRAM  05/14/2024   DEXA SCAN  Completed   Hepatitis C Screening  Completed   HPV VACCINES  Aged Out   COVID-19 Vaccine  Discontinued    Physical Exam: Vitals:   07/12/22 1033  BP: 132/78  Pulse: 84  Resp: 18  Temp: (!) 97.4 F (36.3 C)  SpO2: 99%  Weight: 162 lb 3.2 oz (73.6 kg)  Height: 5\' 4"  (1.626 m)   Body mass index is 27.84 kg/m. Physical Exam Constitutional:      Appearance: Normal appearance.  HENT:     Head: Normocephalic and atraumatic.     Nose: Nose normal.     Mouth/Throat:     Mouth: Mucous membranes are moist.  Eyes:     Conjunctiva/sclera: Conjunctivae normal.  Cardiovascular:     Rate and Rhythm: Normal rate and regular rhythm.  Pulmonary:     Effort: Pulmonary effort is normal.     Breath sounds: Normal breath sounds.  Abdominal:     General: Bowel sounds are normal.     Palpations: Abdomen is soft.  Musculoskeletal:        General: Normal range of motion.     Cervical back: Normal range of motion.  Skin:    General: Skin is warm and dry.     Comments: -  Left inner thigh with open wound, indurated, clear drainage, red tissue -  red circular rashes on back and right lower extremity  Neurological:     General: No focal deficit present.     Mental Status: She is alert and oriented to person, place, and time.  Psychiatric:        Mood and Affect: Mood normal.        Behavior: Behavior normal.        Thought Content: Thought content normal.        Judgment: Judgment normal.     Labs reviewed: Basic Metabolic Panel: Recent Labs    06/17/22 1528  NA 140  K 3.7  CL 104  CO2 26  GLUCOSE 100*  BUN 14  CREATININE 0.84  CALCIUM 9.7   Liver Function Tests: Recent Labs    06/17/22 1528  AST 20  ALT 23  BILITOT 0.4  PROT 6.7   No results for input(s): "LIPASE", "AMYLASE" in the last 8760 hours. No results for  input(s): "AMMONIA" in the last 8760 hours. CBC: Recent Labs    06/17/22 1528  WBC 8.9  NEUTROABS 5,589  HGB 14.5  HCT 42.1  MCV 92.7  PLT 328   Lipid Panel: Recent Labs    06/17/22 1528  CHOL 222*  HDL 85  LDLCALC 96  TRIG 305*  CHOLHDL 2.6   Lab Results  Component Value Date   HGBA1C 5.8 (H) 06/17/2022    Procedures since last visit: No results found.  Assessment/Plan  1. Cellulitis of left lower extremity -  continue wound treatment daily -  apply warm compress to wound -  avoid swimming in the pool - doxycycline (VIBRA-TABS) 100 MG tablet; Take 1 tablet (100 mg total) by mouth 2 (two) times daily for 14 days.  Dispense: 28 tablet; Refill: 0 - saccharomyces boulardii (FLORASTOR) 250 MG capsule; Take 1 capsule (250 mg total) by mouth 2 (two) times daily for 17 days.  Dispense: 34 capsule; Refill: 0  2. Essential hypertension -  BP132/78, stable -  continue Triamterene-hydrochlorothiazide and Losartan  3. ANXIETY DEPRESSION -  mood is stable -  continue Lorazepam and Duloxetine  4. Allergic reaction to drug, sequela -  was given steroid injection at urgent care -  continue Prednisone taper pack -  reminded to not take Bactrim DS and include in her allegy list   Labs/tests ordered:  None  Next appt:  09/27/2022

## 2022-07-18 ENCOUNTER — Ambulatory Visit (INDEPENDENT_AMBULATORY_CARE_PROVIDER_SITE_OTHER): Payer: Medicare HMO | Admitting: Adult Health

## 2022-07-18 ENCOUNTER — Encounter: Payer: Self-pay | Admitting: Adult Health

## 2022-07-18 VITALS — BP 134/82 | HR 100 | Temp 97.8°F | Resp 18 | Ht 64.0 in | Wt 164.4 lb

## 2022-07-18 DIAGNOSIS — T7840XS Allergy, unspecified, sequela: Secondary | ICD-10-CM | POA: Diagnosis not present

## 2022-07-18 DIAGNOSIS — L03116 Cellulitis of left lower limb: Secondary | ICD-10-CM

## 2022-07-18 DIAGNOSIS — I10 Essential (primary) hypertension: Secondary | ICD-10-CM

## 2022-07-18 MED ORDER — PREDNISONE 20 MG PO TABS
ORAL_TABLET | ORAL | 0 refills | Status: AC
Start: 2022-07-18 — End: 2022-07-31

## 2022-07-18 NOTE — Progress Notes (Signed)
[facility] clinic  Provider: Kenard Gower DNP  Code Status:  Full Code  Goals of Care:     07/18/2022    3:34 PM  Advanced Directives  Does Patient Have a Medical Advance Directive? No  Would patient like information on creating a medical advance directive? No - Patient declined     Chief Complaint  Patient presents with   Acute Visit    Patient is here for hives all over . Patient states they go away on prednisone but come right back when medication is complete    HPI: Patient is a 67 y.o. female seen today for an acute visit for hives all over her body. She recently completed Prednisone taper (Prednisone 20 mg X 5 days, started on July 11, 2022) and had steroid shot X 1 for having rashes all over her body after taking Bactrim. Bactrim DS was started after incision and drainage on an abscess on her left thigh.   She completed the Prednisone therapy and then noticed the rashes came back. She has hives on her trunk and bilateral lower extremities. She stated that the hives itches. I/D site on left inner thigh is healing, no erythema.   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   Cholecalciferol (VITAMIN D3) 50 MCG (2000 UT) CAPS Take 1 capsule (2,000 Units total) by mouth daily.   clobetasol ointment (TEMOVATE) 0.05 % Apply 1 Application topically 2 (two) times daily.   doxycycline (VIBRA-TABS) 100 MG tablet Take 1 tablet (100 mg total) by mouth 2 (two) times daily for 14 days.   DULoxetine (CYMBALTA) 60 MG capsule Take 1 capsule (60 mg total) by mouth daily.   LORazepam (ATIVAN) 0.5 MG tablet Take 1 tablet (0.5 mg total) by mouth daily.   Multiple Vitamin (MULTIVITAMIN) tablet Take 1 tablet by mouth daily.   nystatin-triamcinolone ointment (MYCOLOG) Apply 1 Application topically 2 (two) times daily.   omeprazole (PRILOSEC) 20 MG capsule TAKE 1 CAPSULE EVERY DAY   pravastatin (PRAVACHOL) 20 MG tablet TAKE 1 TABLET EVERY DAY   saccharomyces boulardii (FLORASTOR) 250 MG capsule Take 1 capsule (250 mg total) by mouth 2 (two) times daily for 17 days.   triamterene-hydrochlorothiazide (MAXZIDE-25) 37.5-25 MG tablet TAKE 1 TABLET EVERY DAY   losartan (COZAAR) 50 MG tablet TAKE 1 TABLET EVERY DAY   No facility-administered encounter medications on file as of 07/18/2022.    Review of Systems:  Review of Systems  Constitutional:  Negative for appetite change, chills, fatigue and fever.  HENT:  Negative for congestion, hearing loss, rhinorrhea and sore throat.   Eyes: Negative.   Respiratory:  Negative for cough, shortness of breath and wheezing.   Cardiovascular:  Negative for chest pain, palpitations and leg swelling.  Gastrointestinal:  Negative for abdominal pain, constipation, diarrhea, nausea and vomiting.  Genitourinary:  Negative for dysuria.  Musculoskeletal:  Negative for arthralgias, back pain and myalgias.  Skin:  Positive for rash. Negative for color change and wound.  Neurological:  Negative for dizziness, weakness and headaches.  Psychiatric/Behavioral:  Negative for behavioral problems. The  patient is not nervous/anxious.     Health Maintenance  Topic Date Due   Zoster Vaccines- Shingrix (1 of 2) Never done   Fecal DNA (Cologuard)  Never done   Pneumonia Vaccine 36+ Years old (2 of 2 - PCV) 08/14/2017   DTaP/Tdap/Td (2 - Td or Tdap) 01/24/2021   Medicare Annual Wellness (AWV)  10/19/2022   MAMMOGRAM  05/14/2024   DEXA SCAN  Completed   Hepatitis C Screening  Completed   HPV VACCINES  Aged Out   COVID-19 Vaccine  Discontinued    Physical Exam: Vitals:   07/18/22 1531  BP: 134/82  Pulse: 100  Resp: 18  Temp: 97.8 F (36.6 C)  TempSrc: Temporal  SpO2: 98%  Weight: 164 lb 6.4 oz (74.6 kg)  Height: 5\' 4"  (1.626 m)   Body mass index is 28.22 kg/m. Physical Exam Constitutional:      Appearance: Normal appearance.  HENT:     Head: Normocephalic and atraumatic.     Nose: Nose normal.     Mouth/Throat:     Mouth: Mucous membranes are moist.  Eyes:     Conjunctiva/sclera: Conjunctivae normal.  Cardiovascular:     Rate and Rhythm: Normal rate and regular rhythm.  Pulmonary:     Effort: Pulmonary effort is normal.     Breath sounds: Normal breath sounds.  Abdominal:     General: Bowel sounds are normal.     Palpations: Abdomen is soft.  Musculoskeletal:        General: Normal range of motion.     Cervical back: Normal range of motion.  Skin:    General: Skin is warm and dry.     Findings: Rash present.     Comments: Rashes on BLE and trunk Left inner thigh wound healing, no erythema  Neurological:     General: No focal deficit present.     Mental Status: She is alert and oriented to person, place, and time.  Psychiatric:        Mood and Affect: Mood normal.        Behavior: Behavior normal.        Thought Content: Thought content normal.        Judgment: Judgment normal.     Labs reviewed: Basic Metabolic Panel: Recent Labs    06/17/22 1528  NA 140  K 3.7  CL 104  CO2 26  GLUCOSE 100*  BUN 14  CREATININE 0.84  CALCIUM 9.7   Liver  Function Tests: Recent Labs    06/17/22 1528  AST 20  ALT 23  BILITOT 0.4  PROT 6.7   No results for input(s): "LIPASE", "AMYLASE" in the last 8760 hours. No results for input(s): "AMMONIA" in the last 8760 hours. CBC: Recent Labs    06/17/22 1528  WBC 8.9  NEUTROABS 5,589  HGB 14.5  HCT 42.1  MCV 92.7  PLT 328   Lipid Panel: Recent Labs    06/17/22 1528  CHOL 222*  HDL 85  LDLCALC 96  TRIG 305*  CHOLHDL 2.6   Lab Results  Component Value Date   HGBA1C 5.8 (H) 06/17/2022    Procedures since last visit: No results found.  Assessment/Plan  1. Allergic reaction to drug, sequela - predniSONE (DELTASONE) 20 MG tablet; Take 1 tablet (20 mg total) by mouth 2 (two) times daily with a meal for 5 days, THEN 1 tablet (20 mg total) daily for 5 days, THEN 0.5 tablets (10 mg total) daily for 3 days. Then discontinue.  Dispense: 16.5 tablet; Refill: 0  2. Essential hypertension -  BP 134/82 -   continue Triamterene-hydrochlorothiazide and Losartan  3. Cellulitis of left lower extremity -  since LLE wound is healing, no erythema -  will discontinue Doxycycline -  continue application of Bacitacin ointment daily to wound     Labs/tests ordered:  None  Next appt:  07/26/2022

## 2022-07-18 NOTE — Patient Instructions (Signed)
Drug Allergy A drug allergy is when your body reacts in a bad way to a medicine. The reaction may be mild or very bad. In some cases, it can be life-threatening. If you have an allergic reaction, get help right away. You should get help even if the reaction seems mild. What are the causes? This condition is caused by a reaction in your body's defense system. The system sees a medicine as being harmful when it is not. What are the signs or symptoms? Symptoms of a mild reaction A stuffy nose. Tingling in your mouth. An itchy, red rash. Symptoms of a very bad reaction Swelling of your eyes, lips, face, tongue, mouth or back of your throat. Itchy, red, swollen areas of skin. Feeling dizzy or light-headed. Feeling mixed up. Pain in your belly. Trouble with breathing, talking, or swallowing. A tight feeling in your chest. Fast heartbeat. Vomiting or watery poop (diarrhea). How is this treated? There is no cure for allergies. An allergic reaction can be treated with: Medicines to help your symptoms. Medicines that you breathe into your lungs (respiratory inhalers). A shot for a very bad allergic reaction (epinephrine). For a very bad reaction, you may need to stay in the hospital. Your doctor may teach you how to use an allergy kit and how to give yourself an allergy shot. You can give yourself an allergy shot with what is called an auto-injector "pen." Follow these instructions at home: If you have a very bad allergy:  Always keep an allergy pen or your kit with you. This could save your life. Use it as told by your doctor. Make sure that you, the people who live with you, and your employer know how to use your allergy pen or kit. If you used your allergy pen or kit: Get more medicine for it right away. This is important in case you have another reaction. Get help right away. Wear a medical alert bracelet or necklace that says you have an allergy, if your doctor tells you to do  this. General instructions Avoid medicines that you are allergic to. Take over-the-counter and prescription medicines only as told by your doctor. If you were given allergy medicines, do not drive until your health care provider tells you it is safe. If you have hives or a rash: Use over-the-counter medicines as told by your doctor. Put cold, wet cloths on your skin. Take baths or showers in cool water. Avoid hot water. It is up to you to get your test results. Ask how to get your results when they are ready. Tell all your doctors that you have a medicine allergy. Keep all follow-up visits. Contact a doctor if: You think that you are having a mild allergic reaction. You have symptoms that last more than 2 days after your reaction. You get new symptoms. Get help right away if: You had to use your allergy pen or kit. You must go to the emergency room, even if the medicine seems to be working. Your symptoms get worse. You have symptoms of a very bad allergic reaction. These symptoms may be an emergency. Use your allergy pen or kit as you have been told. Get medical help right away. Call your local emergency services (911 in the [place] Do not wait to see if the symptoms will go away. Do not drive yourself to the hospital. Summary A drug allergy is when your body reacts in a bad way to a medicine. Take medicines only as told by your doctor.   Tell all your doctors that you have a medicine allergy. Always keep an allergy pen or kit with you if you have a very bad allergy. This information is not intended to replace advice given to you by your health care provider. Make sure you discuss any questions you have with your health care provider. Document Revised: 06/05/2020 Document Reviewed: 06/05/2020 Elsevier Patient Education  2024 [employer]  

## 2022-07-26 ENCOUNTER — Encounter: Payer: Self-pay | Admitting: Adult Health

## 2022-07-26 ENCOUNTER — Ambulatory Visit (INDEPENDENT_AMBULATORY_CARE_PROVIDER_SITE_OTHER): Payer: Medicare HMO | Admitting: Adult Health

## 2022-07-26 VITALS — BP 128/88 | HR 70 | Temp 97.1°F | Resp 18 | Ht 64.0 in | Wt 163.6 lb

## 2022-07-26 DIAGNOSIS — I10 Essential (primary) hypertension: Secondary | ICD-10-CM

## 2022-07-26 DIAGNOSIS — L210 Seborrhea capitis: Secondary | ICD-10-CM | POA: Diagnosis not present

## 2022-07-26 DIAGNOSIS — R21 Rash and other nonspecific skin eruption: Secondary | ICD-10-CM | POA: Diagnosis not present

## 2022-07-26 DIAGNOSIS — R7303 Prediabetes: Secondary | ICD-10-CM

## 2022-07-26 MED ORDER — CETIRIZINE HCL 10 MG PO TABS
10.0000 mg | ORAL_TABLET | Freq: Every day | ORAL | 1 refills | Status: DC
Start: 2022-07-26 — End: 2023-02-19

## 2022-07-26 NOTE — Progress Notes (Unsigned)
[facility] clinic  Provider:   Code Status: *** Goals of Care:     07/26/2022    9:56 AM  Advanced Directives  Does Patient Have a Medical Advance Directive? No  Would patient like information on creating a medical advance directive? No - Patient declined     Chief Complaint  Patient presents with   Follow-up    Two weeks follow-up   Quality Metric Gaps    Needs to discuss Shingrix,Pneumonia, DTAP and Fecal DNA (Cologuard)    HPI: Patient is a 67 y.o. 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   cetirizine (ZYRTEC) 10 MG tablet Take 10 mg by mouth at bedtime.   Cholecalciferol (VITAMIN D3) 50 MCG (2000 UT) CAPS Take 1 capsule (2,000 Units total) by mouth daily.   clobetasol ointment (TEMOVATE) 0.05 % Apply 1 Application topically 2 (two) times daily.   doxycycline (VIBRA-TABS) 100 MG tablet Take 1 tablet (100 mg total) by mouth 2 (two) times daily for 14 days.   DULoxetine (CYMBALTA) 60 MG capsule Take 1 capsule (60 mg total) by mouth daily.   LORazepam (ATIVAN) 0.5 MG tablet  Take 1 tablet (0.5 mg total) by mouth daily.   Multiple Vitamin (MULTIVITAMIN) tablet Take 1 tablet by mouth daily.   nystatin-triamcinolone ointment (MYCOLOG) Apply 1 Application topically 2 (two) times daily.   omeprazole (PRILOSEC) 20 MG capsule TAKE 1 CAPSULE EVERY DAY   pravastatin (PRAVACHOL) 20 MG tablet TAKE 1 TABLET EVERY DAY   predniSONE (DELTASONE) 20 MG tablet Take 1 tablet (20 mg total) by mouth 2 (two) times daily with a meal for 5 days, THEN 1 tablet (20 mg total) daily for 5 days, THEN 0.5 tablets (10 mg total) daily for 3 days. Then discontinue.   saccharomyces boulardii (FLORASTOR) 250 MG capsule Take 1 capsule (250 mg total) by mouth 2 (two) times daily for 17 days.   triamterene-hydrochlorothiazide (MAXZIDE-25) 37.5-25 MG tablet TAKE 1 TABLET EVERY DAY   losartan (COZAAR) 50 MG tablet TAKE 1 TABLET EVERY DAY   No facility-administered encounter medications on file as of 07/26/2022.    Review of Systems:  Review of Systems  Health Maintenance  Topic Date Due   Zoster Vaccines- Shingrix (1 of 2) Never done   Fecal DNA (Cologuard)  Never done   Pneumonia Vaccine 80+ Years old (2 of 2 - PCV) 08/14/2017   DTaP/Tdap/Td (2 -  Td or Tdap) 01/24/2021   Medicare Annual Wellness (AWV)  10/19/2022   MAMMOGRAM  05/14/2024   DEXA SCAN  Completed   Hepatitis C Screening  Completed   HPV VACCINES  Aged Out   COVID-19 Vaccine  Discontinued    Physical Exam: Vitals:   07/26/22 0948  BP: 128/88  Pulse: 70  Resp: 18  Temp: (!) 97.1 F (36.2 C)  SpO2: 98%  Weight: 163 lb 9.6 oz (74.2 kg)  Height: 5\' 4"  (1.626 m)   Body mass index is 28.08 kg/m. Physical Exam Skin:    Findings: Rash present.     Comments: Back, bilateral axilla and hands     Labs reviewed: Basic Metabolic Panel: Recent Labs    06/17/22 1528  NA 140  K 3.7  CL 104  CO2 26  GLUCOSE 100*  BUN 14  CREATININE 0.84  CALCIUM 9.7   Liver Function Tests: Recent Labs    06/17/22 1528  AST 20   ALT 23  BILITOT 0.4  PROT 6.7   No results for input(s): "LIPASE", "AMYLASE" in the last 8760 hours. No results for input(s): "AMMONIA" in the last 8760 hours. CBC: Recent Labs    06/17/22 1528  WBC 8.9  NEUTROABS 5,589  HGB 14.5  HCT 42.1  MCV 92.7  PLT 328   Lipid Panel: Recent Labs    06/17/22 1528  CHOL 222*  HDL 85  LDLCALC 96  TRIG 305*  CHOLHDL 2.6   Lab Results  Component Value Date   HGBA1C 5.8 (H) 06/17/2022    Procedures since last visit:  Assessment/Plan There are no diagnoses linked to this encounter.   Labs/tests ordered:  * No order type specified * Next appt:  09/27/2022

## 2022-07-29 DIAGNOSIS — L308 Other specified dermatitis: Secondary | ICD-10-CM | POA: Diagnosis not present

## 2022-07-29 DIAGNOSIS — L2089 Other atopic dermatitis: Secondary | ICD-10-CM | POA: Diagnosis not present

## 2022-07-29 DIAGNOSIS — L408 Other psoriasis: Secondary | ICD-10-CM | POA: Diagnosis not present

## 2022-08-01 ENCOUNTER — Other Ambulatory Visit: Payer: Self-pay | Admitting: Nurse Practitioner

## 2022-08-01 DIAGNOSIS — F419 Anxiety disorder, unspecified: Secondary | ICD-10-CM

## 2022-08-01 NOTE — Telephone Encounter (Signed)
Patient has request refill on medcation Lorazepam 0.5mg . Patient medication last refilled 11/05/2021. Patient has No contract on file. Patient next appointment 09/27/2022. Sign Contract added to patient appointment notes. Medication pend and sent to PCP Janyth Contes Janene Harvey, NP

## 2022-08-17 ENCOUNTER — Other Ambulatory Visit: Payer: Self-pay | Admitting: Family

## 2022-08-17 DIAGNOSIS — F419 Anxiety disorder, unspecified: Secondary | ICD-10-CM

## 2022-09-11 ENCOUNTER — Ambulatory Visit: Payer: Medicare HMO | Admitting: Internal Medicine

## 2022-09-11 ENCOUNTER — Encounter: Payer: Self-pay | Admitting: Internal Medicine

## 2022-09-11 VITALS — HR 87 | Temp 97.8°F | Resp 17 | Ht 63.95 in | Wt 170.0 lb

## 2022-09-11 DIAGNOSIS — G8929 Other chronic pain: Secondary | ICD-10-CM

## 2022-09-11 DIAGNOSIS — L409 Psoriasis, unspecified: Secondary | ICD-10-CM

## 2022-09-11 DIAGNOSIS — L400 Psoriasis vulgaris: Secondary | ICD-10-CM

## 2022-09-11 DIAGNOSIS — M549 Dorsalgia, unspecified: Secondary | ICD-10-CM | POA: Diagnosis not present
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
## 2022-09-11 NOTE — Patient Instructions (Signed)
Psoriasis  -  exam and history is NOT consistent with with hives or atopic dermatitis  - Given prolonged prednisone course I would recommend discussing steroid sparing psoriasis treatment like Skyrizi  - Given worsening back pain, I would recommend rheumatology consult for possible psoriatric arthritis.  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   Follow up: with dermatology as quickly as you can get in   Thank you so much for letting me partake in your care today.  Don't hesitate to reach out if you have any additional concerns!  Ferol Luz, MD  [facility] [place] [place]

## 2022-09-16 DIAGNOSIS — L400 Psoriasis vulgaris: Secondary | ICD-10-CM | POA: Diagnosis not present

## 2022-09-19 ENCOUNTER — Other Ambulatory Visit: Payer: Self-pay | Admitting: Nurse Practitioner

## 2022-09-19 DIAGNOSIS — F419 Anxiety disorder, unspecified: Secondary | ICD-10-CM

## 2022-09-19 NOTE — Telephone Encounter (Signed)
Pharmacy Requested refill.  Epic LR: 08/01/2022 Contract Date: 09/25/2020--Note added to upcoming appointment to update.   Pended Rx and sent to [facility] for approval.

## 2022-09-26 ENCOUNTER — Encounter: Payer: Self-pay | Admitting: Nurse Practitioner

## 2022-09-27 ENCOUNTER — Ambulatory Visit (INDEPENDENT_AMBULATORY_CARE_PROVIDER_SITE_OTHER): Payer: Medicare HMO | Admitting: Nurse Practitioner

## 2022-09-27 ENCOUNTER — Encounter: Payer: Self-pay | Admitting: Nurse Practitioner

## 2022-09-27 VITALS — BP 118/76 | HR 93 | Temp 97.2°F | Ht 63.95 in | Wt 174.0 lb

## 2022-09-27 DIAGNOSIS — F341 Dysthymic disorder: Secondary | ICD-10-CM

## 2022-09-27 DIAGNOSIS — I10 Essential (primary) hypertension: Secondary | ICD-10-CM | POA: Diagnosis not present

## 2022-09-27 DIAGNOSIS — E785 Hyperlipidemia, unspecified: Secondary | ICD-10-CM

## 2022-09-27 DIAGNOSIS — F17200 Nicotine dependence, unspecified, uncomplicated: Secondary | ICD-10-CM

## 2022-09-27 DIAGNOSIS — J309 Allergic rhinitis, unspecified: Secondary | ICD-10-CM

## 2022-09-27 DIAGNOSIS — R21 Rash and other nonspecific skin eruption: Secondary | ICD-10-CM | POA: Diagnosis not present

## 2022-09-27 DIAGNOSIS — R7303 Prediabetes: Secondary | ICD-10-CM | POA: Diagnosis not present

## 2022-09-27 DIAGNOSIS — M8580 Other specified disorders of bone density and structure, unspecified site: Secondary | ICD-10-CM

## 2022-09-27 DIAGNOSIS — K219 Gastro-esophageal reflux disease without esophagitis: Secondary | ICD-10-CM | POA: Diagnosis not present

## 2022-09-27 NOTE — Patient Instructions (Signed)
Think about getting pneumonia vaccine and flu shot.    Do not take ativan every day- use only for acute severe anxiety

## 2022-09-27 NOTE — Progress Notes (Signed)
Careteam: Patient Care Team: Sharon Seller, NP as PCP - General (Geriatric Medicine)  PLACE OF SERVICE:  [facility]  Advanced Directive information    Allergies  Allergen Reactions   Bactrim [Sulfamethoxazole-Trimethoprim] Hives   Amoxicillin     Causes a bad yeast infection    Lisinopril     REACTION: Cough    Chief Complaint  Patient presents with   Medical Management of Chronic Issues    3 month follow-up. Discuss need for shingrix, cologuard, pneumonia vaccine, and td/tdap. NCIR verified. Patient c/o of hands peeling-? Related to prednisone dosing. Discuss calcium and vit D dosing.      HPI: Patient is a 67 y.o. female presents for 3 months follow-up.  Ongoing issue with rash that started in June. Saw dermatologist, completed a taper dose of steroids but she was put her back on them due to flare up. Today she's on her first day of 7 on the 30 mg dose. States hair is falling out and skin feels like it's falling off. She thought this was related to Vit. D/Calcium so she stopped taking but no changes in itching.  Went to see an allergist and was that told they believed she had psoriasis and they have concerns this might be psoriatic arthritis. Has rheumatologist appointment until mid November.  Is using Vaseline at night.   Still smoking about 1 pack/day. Sometimes a little bit more due to stress. Her father in law is under hospice care and she cares for him so she reports increased stress. Has been taking ativan daily as well for about 2 weeks.   Is gaining some weight, eats breakfast and dinner mainly (not usually lunch); reports eating fried foods. Not able to exercise due to responsibilities.   Does not want to get flu shot, pneumonia vaccine or cologuard at this time.   Review of Systems:  Review of Systems  Constitutional:  Negative for chills, fever and weight loss.  Respiratory:  Negative for cough and shortness of breath.   Cardiovascular:  Positive for  palpitations (occasional with stress/anxiety). Negative for chest pain and leg swelling.  Gastrointestinal:  Negative for constipation, diarrhea, nausea and vomiting.  Genitourinary:  Negative for dysuria, frequency and urgency.  Skin:  Positive for itching and rash.  Neurological:  Negative for dizziness, weakness and headaches.  Psychiatric/Behavioral:  Positive for depression. The patient is nervous/anxious and has insomnia.     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 She started smoking about 17 years ago. She has a 17.5 pack-year smoking history. She has been exposed to tobacco smoke. She has never used smokeless tobacco. She reports current alcohol use of about 3.0 standard drinks of alcohol per week. She reports that she does not  use drugs.  Family History  Problem Relation Age of Onset   Cancer Mother        lung   Eczema Father    Cirrhosis Father     Medications: Patient's Medications  New Prescriptions   No medications on file  Previous Medications   CALCIUM CARBONATE-VIT D-MIN (CALCIUM 600+D3 PLUS MINERALS) 600-800 MG-UNIT TABS    Take 1 tablet by mouth daily.   CETIRIZINE (ZYRTEC) 10 MG TABLET    Take 1 tablet (10 mg total) by mouth at bedtime.   CHOLECALCIFEROL (VITAMIN D3) 50 MCG (2000 UT) CAPS    Take 1 capsule (2,000 Units total) by mouth daily.   DULOXETINE (CYMBALTA)  60 MG CAPSULE    Take 1 capsule by mouth once daily   LORAZEPAM (ATIVAN) 0.5 MG TABLET    TAKE 1 TABLET BY MOUTH EVERY DAY   LOSARTAN (COZAAR) 50 MG TABLET    TAKE 1 TABLET EVERY DAY   OMEPRAZOLE (PRILOSEC) 20 MG CAPSULE    TAKE 1 CAPSULE EVERY DAY   PRAVASTATIN (PRAVACHOL) 20 MG TABLET    TAKE 1 TABLET EVERY DAY   PREDNISONE (DELTASONE) 10 MG TABLET    Tapering per dermatology instruction   TRIAMTERENE-HYDROCHLOROTHIAZIDE (MAXZIDE-25) 37.5-25 MG TABLET    TAKE 1 TABLET EVERY DAY  Modified Medications   No medications on file  Discontinued Medications   CALCIUM CARBONATE (CALCIUM 600) 600 MG TABS TABLET    Take 1 tablet (600 mg total) by mouth 2 (two) times daily with a meal.   CLOBETASOL OINTMENT (TEMOVATE) 0.05 %    Apply 1 Application topically 2 (two) times daily.   MULTIPLE VITAMIN (MULTIVITAMIN) TABLET    Take 1 tablet by mouth daily.   NYSTATIN-TRIAMCINOLONE OINTMENT (MYCOLOG)    Apply 1 Application topically 2 (two) times daily.    Physical Exam:  Vitals:   09/26/22 1655  BP: 118/76  Pulse: 93  Temp: (!) 97.2 F (36.2 C)  TempSrc: Temporal  SpO2: 97%  Weight: 78.9 kg  Height: 5' 3.95" (1.624 m)   Body mass index is 29.91 kg/m. Wt Readings from Last 3 Encounters:  09/26/22 78.9 kg  09/11/22 77.1 kg  07/26/22 74.2 kg    Physical Exam Constitutional:      Appearance: Normal appearance. She is obese.  Cardiovascular:     Rate and Rhythm: Normal rate and regular rhythm.     Pulses: Normal pulses.     Heart sounds: Normal heart sounds.  Pulmonary:     Effort: Pulmonary effort is normal.     Breath sounds: Normal breath sounds.  Abdominal:     General: Bowel sounds are normal.     Comments: some tightness present  Musculoskeletal:        General: Normal range of motion.  Skin:    General: Skin is warm and dry.     Findings: Rash (scattered redness in upper arms, hands, chest) present.  Neurological:     General: No focal deficit present.     Mental Status:  She is alert and oriented to person, place, and time. Mental status is at baseline.  Psychiatric:        Mood and Affect: Mood normal.        Behavior: Behavior normal.     Labs reviewed: Basic Metabolic Panel: Recent Labs    06/17/22 1528  NA 140  K 3.7  CL 104  CO2 26  GLUCOSE 100*  BUN 14  CREATININE 0.84  CALCIUM 9.7  Liver Function Tests: Recent Labs    06/17/22 1528  AST 20  ALT 23  BILITOT 0.4  PROT 6.7   No results for input(s): "LIPASE", "AMYLASE" in the last 8760 hours. No results for input(s): "AMMONIA" in the last 8760 hours. CBC: Recent Labs    06/17/22 1528  WBC 8.9  NEUTROABS 5,589  HGB 14.5  HCT 42.1  MCV 92.7  PLT 328   Lipid Panel: Recent Labs    06/17/22 1528  CHOL 222*  HDL 85  LDLCALC 96  TRIG 305*  CHOLHDL 2.6   TSH: No results for input(s): "TSH" in the last 8760 hours. A1C: Lab Results  Component Value Date   HGBA1C 5.8 (H) 06/17/2022    Assessment/Plan 1. Essential hypertension -Continue losartan with dietary modifications  -Encouraged dietary modifications and physical activity  2. Prediabetes -Encouraged dietary modifications and physical activity  3. Hyperlipidemia LDL goal <100 -Continue pravastatin -Smoking cessation encouraged -Encouraged dietary modifications and physical activity  4. ANXIETY DEPRESSION -Ongoing, Continue Cymbalta and lifestyle modifications -Ativan PRN, try to decrease frequency due to risk of dependence  5. Osteopenia, unspecified location -Restart Vit D./Calcium -weight bearing exercise encouraged.   6. Allergic rhinitis, unspecified seasonality, unspecified trigger -Stable -Continue cetirizine  7. Smoker -Smoking cessation encouraged -Influenza vaccine and pneumonia vaccine encouraged, education provided on importance of receiving them; declined at this time but will think about it  8. Gastroesophageal reflux disease, unspecified whether esophagitis  present -Stable -Continue omeprazole  9. Rash -Ongoing -Stress relief techniques -Continue dermatology recommendations  -Appointment in November with rheumatology  Return in about 6 months (around 03/27/2023) for routine follow up .  Rollen Sox, FNP-MSN Student -I personally was present during the history, physical exam and medical decision-making activities of this service and have verified that the service and findings are accurately documented in the student's note  Abbey Chatters, NP

## 2022-10-29 DIAGNOSIS — L2089 Other atopic dermatitis: Secondary | ICD-10-CM | POA: Diagnosis not present

## 2022-10-29 DIAGNOSIS — L400 Psoriasis vulgaris: Secondary | ICD-10-CM | POA: Diagnosis not present

## 2022-11-01 ENCOUNTER — Other Ambulatory Visit: Payer: Self-pay | Admitting: Nurse Practitioner

## 2022-11-01 ENCOUNTER — Encounter: Payer: Self-pay | Admitting: Nurse Practitioner

## 2022-11-01 ENCOUNTER — Ambulatory Visit (INDEPENDENT_AMBULATORY_CARE_PROVIDER_SITE_OTHER): Payer: Medicare HMO | Admitting: Nurse Practitioner

## 2022-11-01 VITALS — BP 120/68 | HR 89 | Temp 96.8°F | Ht 64.0 in | Wt 176.8 lb

## 2022-11-01 DIAGNOSIS — L408 Other psoriasis: Secondary | ICD-10-CM | POA: Diagnosis not present

## 2022-11-01 DIAGNOSIS — Z23 Encounter for immunization: Secondary | ICD-10-CM | POA: Diagnosis not present

## 2022-11-01 DIAGNOSIS — Z0000 Encounter for general adult medical examination without abnormal findings: Secondary | ICD-10-CM | POA: Diagnosis not present

## 2022-11-01 DIAGNOSIS — Z1211 Encounter for screening for malignant neoplasm of colon: Secondary | ICD-10-CM | POA: Diagnosis not present

## 2022-11-01 DIAGNOSIS — F17200 Nicotine dependence, unspecified, uncomplicated: Secondary | ICD-10-CM

## 2022-11-01 DIAGNOSIS — Z79899 Other long term (current) drug therapy: Secondary | ICD-10-CM | POA: Diagnosis not present

## 2022-11-01 DIAGNOSIS — Z1212 Encounter for screening for malignant neoplasm of rectum: Secondary | ICD-10-CM

## 2022-11-01 MED ORDER — NYSTATIN 100000 UNIT/GM EX POWD
1.0000 | Freq: Three times a day (TID) | CUTANEOUS | 0 refills | Status: DC
Start: 2022-11-01 — End: 2023-11-04

## 2022-11-01 NOTE — Progress Notes (Signed)
Subjective:   Catherine Gilmore is a 67 y.o. female who presents for Medicare Annual (Subsequent) preventive examination.  Visit Complete: In person   Cardiac Risk Factors include: advanced age (>60men, >91 women);dyslipidemia;hypertension;obesity (BMI >30kg/m2);sedentary lifestyle     Objective:    Today's Vitals   11/01/22 1006  BP: 120/68  Pulse: 89  Temp: (!) 96.8 F (36 C)  TempSrc: Temporal  SpO2: 98%  Weight: 176 lb 12.8 oz (80.2 kg)  Height: 5\' 4"  (1.626 m)   Body mass index is 30.35 kg/m.     11/01/2022   10:17 AM 07/26/2022    9:56 AM 07/18/2022    3:34 PM 07/12/2022    9:10 AM 01/02/2022    3:25 PM 10/18/2021    9:52 AM 04/30/2021   10:27 AM  Advanced Directives  Does Patient Have a Medical Advance Directive? No No No No No No No  Would patient like information on creating a medical advance directive? Yes (MAU/Ambulatory/Procedural Areas - Information given) No - Patient declined No - Patient declined No - Patient declined No - Patient declined  No - Patient declined    Current Medications (verified) Outpatient Encounter Medications as of 11/01/2022  Medication Sig   Calcium Carbonate-Vit D-Min (CALCIUM 600+D3 PLUS MINERALS) 600-800 MG-UNIT TABS Take 1 tablet by mouth daily.   cetirizine (ZYRTEC) 10 MG tablet Take 1 tablet (10 mg total) by mouth at bedtime.   DULoxetine (CYMBALTA) 60 MG capsule Take 1 capsule by mouth once daily   LORazepam (ATIVAN) 0.5 MG tablet Take 0.5 mg by mouth as needed for anxiety.   losartan (COZAAR) 50 MG tablet TAKE 1 TABLET EVERY DAY   omeprazole (PRILOSEC) 20 MG capsule TAKE 1 CAPSULE EVERY DAY   pravastatin (PRAVACHOL) 20 MG tablet TAKE 1 TABLET EVERY DAY   Roflumilast (ZORYVE) 0.3 % CREA Apply 1 Application topically as directed. By dermatologist   triamterene-hydrochlorothiazide (MAXZIDE-25) 37.5-25 MG tablet TAKE 1 TABLET EVERY DAY   Cholecalciferol (VITAMIN D3) 50 MCG (2000 UT) CAPS Take 1 capsule (2,000 Units total) by  mouth daily. (Patient not taking: Reported on 11/01/2022)   clobetasol cream (TEMOVATE) 0.05 % Apply 1 Application topically daily. Per dermatology   LORazepam (ATIVAN) 0.5 MG tablet TAKE 1 TABLET BY MOUTH EVERY DAY (Patient not taking: Reported on 11/01/2022)   predniSONE (DELTASONE) 10 MG tablet Tapering per dermatology instruction (Patient not taking: Reported on 11/01/2022)   No facility-administered encounter medications on file as of 11/01/2022.    Allergies (verified) Bactrim [sulfamethoxazole-trimethoprim], Amoxicillin, and Lisinopril   History: Past Medical History:  Diagnosis Date   Anxiety    Back pain, chronic    Eczema    Flushing    GERD (gastroesophageal reflux disease)    Heart palpitations 03/2019   History of gestational diabetes    History of sinus problem    History of sinus problem    Hyperlipidemia    Hypertension    Hypokalemia    Mitral valve prolapse    Polycythemia    Supraventricular tachycardia, paroxysmal (HCC) 01/1998,02/1998   s/p ablation and cardioversion    Urticaria    Past Surgical History:  Procedure Laterality Date   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   SVT ablation     x2   Family History  Problem Relation Age of Onset   Cancer Mother        lung   Eczema Father    Cirrhosis Father    Social History   Socioeconomic History  Marital status: Married    Spouse name: Not on file   Number of children: Not on file   Years of education: Not on file   Highest education level: Not on file  Occupational History   Not on file  Tobacco Use   Smoking status: Every Day    Current packs/day: 0.50    Average packs/day: 0.5 packs/day for 17.6 years (8.8 ttl pk-yrs)    Types: Cigarettes    Start date: 04/07/2005    Passive exposure: Current   Smokeless tobacco: Never  Vaping Use   Vaping status: Never Used  Substance and Sexual Activity   Alcohol use: Yes    Alcohol/week: 3.0 standard drinks of alcohol    Types: 3 Cans of beer  per week   Drug use: No   Sexual activity: Yes  Other Topics Concern   Not on file  Social History Narrative   Not on file   Social Determinants of Health   Financial Resource Strain: Not on file  Food Insecurity: No Food Insecurity (02/12/2021)   Received from [facility], [facility]   Hunger Vital Sign    Worried About Running Out of Food in the Last Year: Never true    Ran Out of Food in the Last Year: Never true  Transportation Needs: Not on file  Physical Activity: Not on file  Stress: Not on file  Social Connections: Unknown (05/18/2021)   Received from [facility], [facility]   Social Network    Social Network: Not on file    Tobacco Counseling Ready to quit: Not Answered Counseling given: Not Answered   Clinical Intake:  Pre-visit preparation completed: Yes  Pain : No/denies pain     BMI - recorded: 30.35 Nutritional Status: BMI > 30  Obese  How often do you need to have someone help you when you read instructions, pamphlets, or other written materials from your doctor or pharmacy?: 1 - Never         Activities of Daily Living    11/01/2022   10:49 AM  In your present state of health, do you have any difficulty performing the following activities:  Hearing? 0  Vision? 1  Difficulty concentrating or making decisions? 0  Walking or climbing stairs? 0  Dressing or bathing? 0  Doing errands, shopping? 0  Preparing Food and eating ? N  Using the Toilet? N  In the past six months, have you accidently leaked urine? Y  Do you have problems with loss of bowel control? N  Managing your Medications? N  Managing your Finances? N  Housekeeping or managing your Housekeeping? N    Patient Care Team: Sharon Seller, NP as PCP - General (Geriatric Medicine)  Indicate any recent Medical Services you may have received from other than [facility] providers in the past year (date may be approximate).     Assessment:   This is a routine wellness  examination for Catherine Gilmore.  Hearing/Vision screen Vision Screening   Right eye Left eye Both eyes  Without correction 20/25 20/40 20/25   With correction     Comments: Last eye exam greater than 12 months ago   Hearing Screening - Comments:: Admits to hearing issues, declined referral to audiologist    Goals Addressed   None    Depression Screen    11/01/2022   10:14 AM 09/27/2022   10:45 AM 07/26/2022    9:56 AM 07/12/2022   10:47 AM 10/18/2021    9:49 AM 10/16/2020  11:11 AM 06/18/2019   10:29 AM  PHQ 2/9 Scores  PHQ - 2 Score 0 0 0 0 0 6 2  PHQ- 9 Score   0   14 12    Fall Risk    11/01/2022   10:12 AM 09/27/2022   10:44 AM 07/26/2022    9:57 AM 07/12/2022   10:46 AM 01/02/2022    3:25 PM  Fall Risk   Falls in the past year? 0 0 0 0 0  Number falls in past yr: 0 0 0 0 0  Injury with Fall? 0 0 0 0 0  Risk for fall due to : No Fall Risks No Fall Risks History of fall(s) History of fall(s) No Fall Risks  Follow up Falls evaluation completed Falls evaluation completed Falls evaluation completed Falls evaluation completed Falls evaluation completed    MEDICARE RISK AT HOME: Medicare Risk at Home Any stairs in or around the home?: Yes If so, are there any without handrails?: No Home free of loose throw rugs in walkways, pet beds, electrical cords, etc?: Yes Adequate lighting in your home to reduce risk of falls?: Yes Life alert?: No Use of a cane, walker or w/c?: No Grab bars in the bathroom?: No Shower chair or bench in shower?: No Elevated toilet seat or a handicapped toilet?: No  TIMED UP AND GO:  Was the test performed?  No    Cognitive Function:    11/01/2022   10:21 AM 10/16/2020   11:43 AM  MMSE - Mini Mental State Exam  Orientation to time 5 5  Orientation to Place 5 5  Registration 3 3  Attention/ Calculation 5 5  Recall 3 3  Language- name 2 objects 2 2  Language- repeat 1 1  Language- follow 3 step command 2 3  Language- read & follow direction 1  1  Write a sentence 1 1  Copy design 1 1  Total score 29 30        10/18/2021    9:53 AM  6CIT Screen  What Year? 0 points  What month? 0 points  What time? 0 points  Count back from 20 0 points  Months in reverse 0 points  Repeat phrase 0 points  Total Score 0 points    Immunizations Immunization History  Administered Date(s) Administered   Influenza Split 01/24/2011, 10/17/2011   Influenza Whole 10/02/2007, 10/05/2008, 11/22/2009   Influenza,trivalent, recombinat, inj, PF 01/24/2011, 10/17/2011   Pneumococcal Polysaccharide-23 08/14/2016   Tdap 01/25/2011    TDAP status: Due, Education has been provided regarding the importance of this vaccine. Advised may receive this vaccine at local pharmacy or Health Dept. Aware to provide a copy of the vaccination record if obtained from local pharmacy or Health Dept. Verbalized acceptance and understanding.  Flu Vaccine status: Declined, Education has been provided regarding the importance of this vaccine but patient still declined. Advised may receive this vaccine at local pharmacy or Health Dept. Aware to provide a copy of the vaccination record if obtained from local pharmacy or Health Dept. Verbalized acceptance and understanding.  Pneumococcal vaccine status: Due, Education has been provided regarding the importance of this vaccine. Advised may receive this vaccine at local pharmacy or Health Dept. Aware to provide a copy of the vaccination record if obtained from local pharmacy or Health Dept. Verbalized acceptance and understanding.  Covid-19 vaccine status: Declined, Education has been provided regarding the importance of this vaccine but patient still declined. Advised may receive this vaccine at  local pharmacy or Health Dept.or vaccine clinic. Aware to provide a copy of the vaccination record if obtained from local pharmacy or Health Dept. Verbalized acceptance and understanding.  Qualifies for Shingles Vaccine? Yes   Zostavax  completed No   Shingrix Completed?: No.    Education has been provided regarding the importance of this vaccine. Patient has been advised to call insurance company to determine out of pocket expense if they have not yet received this vaccine. Advised may also receive vaccine at local pharmacy or Health Dept. Verbalized acceptance and understanding.  Screening Tests Health Maintenance  Topic Date Due   Fecal DNA (Cologuard)  Never done   DTaP/Tdap/Td (2 - Td or Tdap) 01/24/2021   Zoster Vaccines- Shingrix (1 of 2) 02/01/2023 (Originally 04/14/1974)   Pneumonia Vaccine 35+ Years old (2 of 2 - PCV) 11/01/2023 (Originally 08/14/2017)   Medicare Annual Wellness (AWV)  11/01/2023   MAMMOGRAM  05/14/2024   DEXA SCAN  Completed   Hepatitis C Screening  Completed   HPV VACCINES  Aged Out   COVID-19 Vaccine  Discontinued    Health Maintenance  Health Maintenance Due  Topic Date Due   Fecal DNA (Cologuard)  Never done   DTaP/Tdap/Td (2 - Td or Tdap) 01/24/2021   Will order cologard   Mammogram status: Completed 05/15/2022. Repeat every year  Bone Density status: Completed 03/29/2022. Results reflect: Bone density results: OSTEOPENIA. Repeat every 2 years.  Lung Cancer Screening: (Low Dose CT Chest recommended if Age 53-80 years, 20 pack-year currently smoking OR have quit w/in 15years.) does qualify.   Lung Cancer Screening Referral:   Additional Screening:  Hepatitis C Screening: does qualify; Completed   Vision Screening: Recommended annual ophthalmology exams for early detection of glaucoma and other disorders of the eye. Is the patient up to date with their annual eye exam?  Yes  Who is the provider or what is the name of the office in which the patient attends annual eye exams? [facility] If pt is not established with a provider, would they like to be referred to a provider to establish care? No .   Dental Screening: Recommended annual dental exams for proper oral hygiene  Community  Resource Referral / Chronic Care Management: CRR required this visit?  No   CCM required this visit?  No     Plan:     I have personally reviewed and noted the following in the patient's chart:   Medical and social history Use of alcohol, tobacco or illicit drugs  Current medications and supplements including opioid prescriptions. Patient is not currently taking opioid prescriptions. Functional ability and status Nutritional status Physical activity Advanced directives List of other physicians Hospitalizations, surgeries, and ER visits in previous 12 months Vitals Screenings to include cognitive, depression, and falls Referrals and appointments  In addition, I have reviewed and discussed with patient certain preventive protocols, quality metrics, and best practice recommendations. A written personalized care plan for preventive services as well as general preventive health recommendations were provided to patient.     Sharon Seller, NP   11/01/2022

## 2022-11-20 DIAGNOSIS — M545 Low back pain, unspecified: Secondary | ICD-10-CM | POA: Diagnosis not present

## 2022-11-20 DIAGNOSIS — L409 Psoriasis, unspecified: Secondary | ICD-10-CM | POA: Diagnosis not present

## 2022-11-20 DIAGNOSIS — G8929 Other chronic pain: Secondary | ICD-10-CM | POA: Diagnosis not present

## 2022-11-22 ENCOUNTER — Other Ambulatory Visit: Payer: Self-pay

## 2022-11-22 DIAGNOSIS — Z87891 Personal history of nicotine dependence: Secondary | ICD-10-CM

## 2022-11-22 DIAGNOSIS — F17210 Nicotine dependence, cigarettes, uncomplicated: Secondary | ICD-10-CM

## 2022-11-22 DIAGNOSIS — Z122 Encounter for screening for malignant neoplasm of respiratory organs: Secondary | ICD-10-CM

## 2022-11-25 ENCOUNTER — Encounter: Payer: Self-pay | Admitting: Adult Health

## 2022-11-25 ENCOUNTER — Ambulatory Visit (INDEPENDENT_AMBULATORY_CARE_PROVIDER_SITE_OTHER): Payer: Medicare HMO | Admitting: Adult Health

## 2022-11-25 DIAGNOSIS — F17210 Nicotine dependence, cigarettes, uncomplicated: Secondary | ICD-10-CM | POA: Diagnosis not present

## 2022-11-25 NOTE — Progress Notes (Signed)
  Virtual Visit via Telephone Note  I connected with Catherine Gilmore , 11/25/22 11:15 AM by a telemedicine application and verified that I am speaking with the correct person using two identifiers.  Location: Patient: home Provider: home   I discussed the limitations of evaluation and management by telemedicine and the availability of in person appointments. The patient expressed understanding and agreed to proceed.   Shared Decision Making Visit Lung Cancer Screening Program [phone])   Eligibility: 67 y.o. Pack Years Smoking History Calculation = 49.5 pack years (# packs/per year x # years smoked) Recent History of coughing up blood  no Unexplained weight loss? no ( >Than 15 pounds within the last 6 months ) Prior History Lung / other cancer no (Diagnosis within the last 5 years already requiring surveillance chest CT Scans). Smoking Status Current Smoker  Visit Components: Discussion included one or more decision making aids. YES Discussion included risk/benefits of screening. YES Discussion included potential follow up diagnostic testing for abnormal scans. YES Discussion included meaning and risk of over diagnosis. YES Discussion included meaning and risk of False Positives. YES Discussion included meaning of total radiation exposure. YES  Counseling Included: Importance of adherence to annual lung cancer LDCT screening. YES Impact of comorbidities on ability to participate in the program. YES Ability and willingness to under diagnostic treatment. YES  Smoking Cessation Counseling: Current Smokers:  Discussed importance of smoking cessation. yes Information about tobacco cessation classes and interventions provided to patient. yes Patient provided with "ticket" for LDCT Scan. yes Symptomatic Patient. no Diagnosis Code: Tobacco Use Z72.0 Asymptomatic Patient yes  Counseling (Intermediate counseling: > three minutes counseling) [id number] Written Order for Lung Cancer  Screening with LDCT placed in Epic. Yes (CT Chest Lung Cancer Screening Low Dose W/O CM) [id number]  Z12.2-Screening of respiratory organs Z87.891-Personal history of nicotine dependence   [name] 11/25/22

## 2022-11-25 NOTE — Patient Instructions (Addendum)

## 2022-11-28 ENCOUNTER — Ambulatory Visit (HOSPITAL_BASED_OUTPATIENT_CLINIC_OR_DEPARTMENT_OTHER)
Admission: RE | Admit: 2022-11-28 | Discharge: 2022-11-28 | Disposition: A | Discharge status: Home / Self Care | Payer: Medicare HMO | Source: Ambulatory Visit | Attending: Acute Care | Admitting: Acute Care

## 2022-11-28 DIAGNOSIS — Z122 Encounter for screening for malignant neoplasm of respiratory organs: Secondary | ICD-10-CM

## 2022-11-28 DIAGNOSIS — F17210 Nicotine dependence, cigarettes, uncomplicated: Secondary | ICD-10-CM

## 2022-11-28 DIAGNOSIS — Z87891 Personal history of nicotine dependence: Secondary | ICD-10-CM | POA: Diagnosis not present

## 2022-11-28 DIAGNOSIS — L400 Psoriasis vulgaris: Secondary | ICD-10-CM | POA: Diagnosis not present

## 2022-11-30 ENCOUNTER — Other Ambulatory Visit: Payer: Self-pay

## 2022-11-30 ENCOUNTER — Emergency Department (HOSPITAL_BASED_OUTPATIENT_CLINIC_OR_DEPARTMENT_OTHER)
Admission: EM | Admit: 2022-11-30 | Discharge: 2022-11-30 | Disposition: A | Discharge status: Home / Self Care | Payer: Medicare HMO | Attending: Emergency Medicine | Admitting: Emergency Medicine

## 2022-11-30 ENCOUNTER — Encounter (HOSPITAL_BASED_OUTPATIENT_CLINIC_OR_DEPARTMENT_OTHER): Payer: Self-pay

## 2022-11-30 DIAGNOSIS — T50905A Adverse effect of unspecified drugs, medicaments and biological substances, initial encounter: Secondary | ICD-10-CM | POA: Insufficient documentation

## 2022-11-30 DIAGNOSIS — I10 Essential (primary) hypertension: Secondary | ICD-10-CM | POA: Insufficient documentation

## 2022-11-30 DIAGNOSIS — R197 Diarrhea, unspecified: Secondary | ICD-10-CM | POA: Insufficient documentation

## 2022-11-30 DIAGNOSIS — Z79899 Other long term (current) drug therapy: Secondary | ICD-10-CM | POA: Insufficient documentation

## 2022-11-30 LAB — CBC
HCT: 42.7 % (ref 36.0–46.0)
Hemoglobin: 14.6 g/dL (ref 12.0–15.0)
MCH: 31.4 pg (ref 26.0–34.0)
MCHC: 34.2 g/dL (ref 30.0–36.0)
MCV: 91.8 fL (ref 80.0–100.0)
Platelets: 331 10*3/uL (ref 150–400)
RBC: 4.65 MIL/uL (ref 3.87–5.11)
RDW: 12.1 % (ref 11.5–15.5)
WBC: 7.7 10*3/uL (ref 4.0–10.5)
nRBC: 0 % (ref 0.0–0.2)

## 2022-11-30 LAB — COMPREHENSIVE METABOLIC PANEL
ALT: 37 U/L (ref 0–44)
AST: 31 U/L (ref 15–41)
Albumin: 3.7 g/dL (ref 3.5–5.0)
Alkaline Phosphatase: 47 U/L (ref 38–126)
Anion gap: 8 (ref 5–15)
BUN: 12 mg/dL (ref 8–23)
CO2: 23 mmol/L (ref 22–32)
Calcium: 9.1 mg/dL (ref 8.9–10.3)
Chloride: 108 mmol/L (ref 98–111)
Creatinine, Ser: 0.86 mg/dL (ref 0.44–1.00)
GFR, Estimated: >60 mL/min (ref >60)
Glucose, Bld: 117 mg/dL — ABNORMAL HIGH (ref 70–99)
Potassium: 3.9 mmol/L (ref 3.5–5.1)
Sodium: 139 mmol/L (ref 135–145)
Total Bilirubin: 0.7 mg/dL (ref <1.2)
Total Protein: 6.6 g/dL (ref 6.5–8.1)

## 2022-11-30 LAB — LIPASE, BLOOD: Lipase: 28 U/L (ref 11–51)

## 2022-11-30 MED ORDER — ONDANSETRON 4 MG PO TBDP
4.0000 mg | ORAL_TABLET | Freq: Once | ORAL | Status: AC
  Administered 2022-11-30: 4 mg via ORAL
  Filled 2022-11-30: qty 1

## 2022-11-30 MED ORDER — ONDANSETRON HCL 4 MG PO TABS: 4.0000 mg | ORAL_TABLET | Freq: Four times a day (QID) | ORAL | 0 refills | Status: DC

## 2022-11-30 NOTE — ED Provider Notes (Signed)
[place] [facility] AT [facility] Provider Note   CSN: [id number] Arrival date & time: 11/30/22  1246     History  Chief Complaint  Patient presents with   Diarrhea    Catherine Gilmore is a 67 y.o. female with history of hypertension, mitral valve disease, GERD, psoriasis, who presents the emergency department complaining of diarrhea.  Patient states that she recently was started on Skyrizi injections for her psoriasis.  She was post to have the injections on weeks 0, week 4, and week 12.  She accidentally took another dose at week 2.  Almost immediately after the injection she started feeling some lightheadedness followed by 67 episodes of diarrhea.  She still feels somewhat lightheaded, but diarrhea has improved after she took Imodium.  Denies any fever, chills, chest pain, shortness of breath, syncope, abdominal pain, nausea or vomiting.  Her bowel movements were loose, nonbloody.  Denies any recent travel, changes in foods, any other changes in medications, or any recent antibiotic usage.   Diarrhea      Home Medications Prior to Admission medications   Medication Sig Start Date End Date Taking? Authorizing Provider  ondansetron (ZOFRAN) 4 MG tablet Take 1 tablet (4 mg total) by mouth every 6 (six) hours. 11/30/22  Yes Gaelyn Tukes T, PA-C  Calcium Carbonate-Vit D-Min (CALCIUM 600+D3 PLUS MINERALS) 600-800 MG-UNIT TABS Take 1 tablet by mouth daily.    [provider]  cetirizine (ZYRTEC) 10 MG tablet Take 1 tablet (10 mg total) by mouth at bedtime. 07/26/22   Medina-Vargas, Monina C, NP  Cholecalciferol (VITAMIN D3) 50 MCG (2000 UT) CAPS Take 1 capsule (2,000 Units total) by mouth daily. Patient not taking: Reported on 11/01/2022 03/29/22   Sharon Seller, NP  clobetasol cream (TEMOVATE) 0.05 % Apply 1 Application topically daily. Per dermatology    [provider]  DULoxetine (CYMBALTA) 60 MG capsule Take 1 capsule by mouth once  daily 08/19/22   Sharon Seller, NP  LORazepam (ATIVAN) 0.5 MG tablet TAKE 1 TABLET BY MOUTH EVERY DAY Patient not taking: Reported on 11/01/2022 09/19/22   Sharon Seller, NP  LORazepam (ATIVAN) 0.5 MG tablet Take 0.5 mg by mouth as needed for anxiety.    [provider]  losartan (COZAAR) 50 MG tablet TAKE 1 TABLET EVERY DAY 02/11/22 01/08/48  Sharon Seller, NP  nystatin (MYCOSTATIN/NYSTOP) powder Apply 1 Application topically 3 (three) times daily. 11/01/22   Sharon Seller, NP  omeprazole (PRILOSEC) 20 MG capsule TAKE 1 CAPSULE EVERY DAY 05/13/22   Sharon Seller, NP  pravastatin (PRAVACHOL) 20 MG tablet TAKE 1 TABLET EVERY DAY 04/23/21   Passmore, Enid Derry I, NP  Roflumilast (ZORYVE) 0.3 % CREA Apply 1 Application topically as directed. By dermatologist    [provider]  triamterene-hydrochlorothiazide (MAXZIDE-25) 37.5-25 MG tablet TAKE 1 TABLET EVERY DAY 04/23/21   Orion Crook I, NP      Allergies    Bactrim [sulfamethoxazole-trimethoprim], Amoxicillin, and Lisinopril    Review of Systems   Review of Systems  Gastrointestinal:  Positive for diarrhea.  Neurological:  Positive for light-headedness.  All other systems reviewed and are negative.   Physical Exam Updated Vital Signs BP (!) 144/90   Pulse 71   Temp 98.7 F (37.1 C) (Oral)   Resp 16   Ht 5\' 4"  (1.626 m)   Wt 80 kg   SpO2 97%   BMI 30.27 kg/m  Physical Exam Vitals and nursing note reviewed.  Constitutional:      Appearance: Normal appearance.  HENT:     Head: Normocephalic and atraumatic.  Eyes:     Conjunctiva/sclera: Conjunctivae normal.  Cardiovascular:     Rate and Rhythm: Normal rate and regular rhythm.  Pulmonary:     Effort: Pulmonary effort is normal. No respiratory distress.     Breath sounds: Normal breath sounds.  Abdominal:     General: There is no distension.     Palpations: Abdomen is soft.     Tenderness: There is no abdominal tenderness.  Skin:     General: Skin is warm and dry.  Neurological:     General: No focal deficit present.     Mental Status: She is alert.     ED Results / Procedures / Treatments   Labs (all labs ordered are listed, but only abnormal results are displayed) Labs Reviewed  COMPREHENSIVE METABOLIC PANEL - Abnormal; Notable for the following components:      Result Value   Glucose, Bld 117 (*)    All other components within normal limits  LIPASE, BLOOD  CBC    EKG None  Radiology No results found.  Procedures Procedures    Medications Ordered in ED Medications  ondansetron (ZOFRAN-ODT) disintegrating tablet 4 mg (4 mg Oral Given 11/30/22 1503)    ED Course/ Medical Decision Making/ A&P                                 Medical Decision Making Amount and/or Complexity of Data Reviewed Labs: ordered.  This patient is a 67 y.o. female  who presents to the ED for concern of diarrhea.   Differential diagnoses prior to evaluation: The emergent differential diagnosis includes, but is not limited to,  Infectious diarrhea (viral, bacterial), GI Bleed, Appendicitis, Mesenteric Ischemia, Diverticulitis, endocrine causes (adrenal, thyroid), Toxicologic, IBD. This is not an exhaustive differential.   Past Medical History / Co-morbidities / Social History: hypertension, mitral valve disease, GERD, psoriasis  Physical Exam: Physical exam performed. The pertinent findings include: Hypertensive, otherwise normal vital signs.  Patient states she has not taken her blood pressure medication today.  Clinically well-appearing.  Abdomen soft and nontender.  Lab Tests/Imaging studies: I personally interpreted labs/imaging and the pertinent results include: CBC and CMP unremarkable.  Normal lipase.  Patient has benign abdominal exam and reassuring laboratory evaluation, will defer emergent abdominal imaging at this time.  Medications: I ordered medication including Zofran.  I have reviewed the patients home  medicines and have made adjustments as needed.   Disposition: After consideration of the diagnostic results and the patients response to treatment, I feel that emergency department workup does not suggest an emergent condition requiring admission or immediate intervention beyond what has been performed at this time. The plan is: Discharge home with symptomatic management of diarrhea after recent medication change.  I did review the most common side effects of this medication, and abdominal pain and diarrhea are some of the most common.  I discussed this with the patient.  She is clinically well-appearing and has reassuring laboratory evaluation.  Will give her some Zofran for home to help with some of the nausea that she is having.  She feels reassured by these results, she was feeling very anxious and on the verge of a panic attack, which she does have a history of.  Instructed her to follow-up with her dermatologist regarding this medication regimen. No emergent etiology  found for symptoms today. The patient is safe for discharge and has been instructed to return immediately for worsening symptoms, change in symptoms or any other concerns.  Final Clinical Impression(s) / ED Diagnoses Final diagnoses:  Diarrhea, unspecified type  Adverse effect of drug, initial encounter    Rx / DC Orders ED Discharge Orders          Ordered    ondansetron (ZOFRAN) 4 MG tablet  Every 6 hours        11/30/22 1453           Portions of this report may have been transcribed using voice recognition software. Every effort was made to ensure accuracy; however, inadvertent computerized transcription errors may be present.    Jeanella Flattery 11/30/22 1535    Alvira Monday, MD 11/30/22 [phone]

## 2022-11-30 NOTE — ED Triage Notes (Signed)
The patient started having diarrhea last night. She thinks its to do with her new medications.

## 2022-11-30 NOTE — ED Notes (Signed)
Pt sts diarrhea started last night after taking Skyrizi injection; she realized after the fact that the shot was 2 wks early; diarrhea subsided after 3 doses of immodium

## 2022-11-30 NOTE — Discharge Instructions (Addendum)
You were seen in the ER for diarrhea and lightheadedness.  As we discussed your blood work looked reassuring.  We look for evidence of infection, electrolyte disturbance, or changes in your liver, kidney, or pancreas function.  We have given you medication to help with nausea, but can sometimes also help with diarrhea.  I recommend trying to stay as well-hydrated as possible.  Please call your dermatologist to let them know about this possible medication reaction.  Continue to monitor how you're doing and return to the ER for new or worsening symptoms such as worsening lightheadedness, passing out, inability to keep down food/fluids, etc.

## 2022-12-02 DIAGNOSIS — H524 Presbyopia: Secondary | ICD-10-CM | POA: Diagnosis not present

## 2022-12-02 DIAGNOSIS — Z961 Presence of intraocular lens: Secondary | ICD-10-CM | POA: Diagnosis not present

## 2022-12-08 ENCOUNTER — Other Ambulatory Visit: Payer: Self-pay | Admitting: Nurse Practitioner

## 2022-12-08 DIAGNOSIS — I10 Essential (primary) hypertension: Secondary | ICD-10-CM

## 2022-12-17 ENCOUNTER — Other Ambulatory Visit: Payer: Self-pay

## 2022-12-17 DIAGNOSIS — E785 Hyperlipidemia, unspecified: Secondary | ICD-10-CM

## 2022-12-17 MED ORDER — PRAVASTATIN SODIUM 20 MG PO TABS
20.0000 mg | ORAL_TABLET | Freq: Every day | ORAL | 1 refills | Status: DC
Start: 2022-12-17 — End: 2023-07-09

## 2022-12-20 ENCOUNTER — Other Ambulatory Visit: Payer: Self-pay | Admitting: Acute Care

## 2022-12-20 DIAGNOSIS — Z87891 Personal history of nicotine dependence: Secondary | ICD-10-CM

## 2022-12-20 DIAGNOSIS — Z122 Encounter for screening for malignant neoplasm of respiratory organs: Secondary | ICD-10-CM

## 2022-12-20 DIAGNOSIS — F17210 Nicotine dependence, cigarettes, uncomplicated: Secondary | ICD-10-CM

## 2022-12-25 ENCOUNTER — Other Ambulatory Visit: Payer: Self-pay

## 2022-12-25 DIAGNOSIS — F419 Anxiety disorder, unspecified: Secondary | ICD-10-CM

## 2022-12-25 NOTE — Telephone Encounter (Signed)
Patient called to say she would like to change her Lorazepam rx back to [facility] as she works there and its convenient and cheaper.   Patient is requesting a refill of the following medications: Requested Prescriptions   Pending Prescriptions Disp Refills   LORazepam (ATIVAN) 0.5 MG tablet 30 tablet 0    Sig: Take 1 tablet (0.5 mg total) by mouth as needed for anxiety (daily).    Date of last refill: 09/19/22  Refill amount: 30  Treatment agreement date: 09/27/22, notation made on pending appointment to update pharmacy on [place] agreement

## 2022-12-26 MED ORDER — LORAZEPAM 0.5 MG PO TABS: 0.5000 mg | ORAL_TABLET | ORAL | 1 refills | Status: DC | PRN

## 2023-02-19 ENCOUNTER — Other Ambulatory Visit: Payer: Self-pay | Admitting: Adult Health

## 2023-02-19 DIAGNOSIS — R21 Rash and other nonspecific skin eruption: Secondary | ICD-10-CM

## 2023-03-20 ENCOUNTER — Ambulatory Visit: Admitting: Nurse Practitioner

## 2023-03-21 ENCOUNTER — Telehealth: Admitting: Nurse Practitioner

## 2023-03-21 ENCOUNTER — Telehealth: Payer: Self-pay | Admitting: *Deleted

## 2023-03-21 NOTE — Telephone Encounter (Signed)
 Copied from CRM [id number]. Topic: Appointments - Appointment Cancel/Reschedule >> Mar 21, 2023  8:24 AM Catherine Gilmore wrote: Patient called to cancel and get a new appointment for her visit today, she has to go to work, patients callback number is [phone] but she wont be able to have her phone to answer, patients next off day is Monday and she would like her appointment on that day if possible, she prefers afternoon appointments if possible. If she does not answer you can leave her a message with her appointment date and time.     Patient has an appointment scheduled for 03/28/2023 @ 10 with Shanda Bumps. Patient notified and agreed.

## 2023-03-24 ENCOUNTER — Other Ambulatory Visit: Payer: Self-pay | Admitting: Nurse Practitioner

## 2023-03-24 DIAGNOSIS — F419 Anxiety disorder, unspecified: Secondary | ICD-10-CM

## 2023-03-28 ENCOUNTER — Encounter: Payer: Medicare HMO | Admitting: Nurse Practitioner

## 2023-03-28 ENCOUNTER — Encounter: Payer: Self-pay | Admitting: Nurse Practitioner

## 2023-03-28 NOTE — Progress Notes (Signed)
 This encounter was created in error - please disregard.

## 2023-04-16 ENCOUNTER — Other Ambulatory Visit: Payer: Self-pay

## 2023-04-16 DIAGNOSIS — I10 Essential (primary) hypertension: Secondary | ICD-10-CM

## 2023-04-16 NOTE — Telephone Encounter (Signed)
 High risk or very high risk warning populated when attempting to refill medication. RX request sent to PCP for review and approval if warranted.

## 2023-04-17 MED ORDER — TRIAMTERENE-HCTZ 37.5-25 MG PO TABS: 1.0000 | ORAL_TABLET | Freq: Every day | ORAL | 3 refills | Status: DC

## 2023-05-05 DIAGNOSIS — L400 Psoriasis vulgaris: Secondary | ICD-10-CM | POA: Diagnosis not present

## 2023-05-28 ENCOUNTER — Other Ambulatory Visit: Payer: Self-pay | Admitting: Nurse Practitioner

## 2023-05-28 DIAGNOSIS — R21 Rash and other nonspecific skin eruption: Secondary | ICD-10-CM

## 2023-06-24 ENCOUNTER — Other Ambulatory Visit: Payer: Self-pay | Admitting: Nurse Practitioner

## 2023-06-24 DIAGNOSIS — F419 Anxiety disorder, unspecified: Secondary | ICD-10-CM

## 2023-06-24 MED ORDER — DULOXETINE HCL 60 MG PO CPEP: 60.0000 mg | ORAL_CAPSULE | Freq: Every day | ORAL | 0 refills | Status: DC

## 2023-06-24 NOTE — Telephone Encounter (Signed)
 Patient has made an appointment for 06/26/2023. 30 supply has been sent into pharmacy. Message routed back to PCP Roselie Conger, Champ Coma, NP as Catherine Gilmore.

## 2023-06-24 NOTE — Telephone Encounter (Signed)
 Overdue for appt, will decline Rx until appt is made, can give 30 days once she makes an appt but needs follow up

## 2023-06-24 NOTE — Telephone Encounter (Signed)
 Patient is requesting refill on medication Cymbalta . Patient was previously given 3 month supply with no refills. Medication pend and sent to PCP Roselie Conger Jessica K, NP for approval.

## 2023-06-24 NOTE — Addendum Note (Signed)
 Addended by: Javen Hinderliter E on: 06/24/2023 03:32 PM   Modules accepted: Orders

## 2023-06-24 NOTE — Telephone Encounter (Signed)
 Message forwarded to Admin staff. Please call patient to schedule an appointment in office to see PCP Catherine Conger Champ Coma, NP soon. Patient was told to schedule routine visit after previous one in September 2024.

## 2023-06-26 ENCOUNTER — Telehealth: Payer: Medicare (Managed Care) | Admitting: Nurse Practitioner

## 2023-06-30 ENCOUNTER — Encounter: Payer: Self-pay | Admitting: Nurse Practitioner

## 2023-06-30 ENCOUNTER — Ambulatory Visit (INDEPENDENT_AMBULATORY_CARE_PROVIDER_SITE_OTHER): Payer: Medicare (Managed Care) | Admitting: Nurse Practitioner

## 2023-06-30 VITALS — BP 114/78 | HR 80 | Temp 97.4°F | Ht 64.0 in | Wt 164.4 lb

## 2023-06-30 DIAGNOSIS — E785 Hyperlipidemia, unspecified: Secondary | ICD-10-CM

## 2023-06-30 DIAGNOSIS — M8580 Other specified disorders of bone density and structure, unspecified site: Secondary | ICD-10-CM | POA: Diagnosis not present

## 2023-06-30 DIAGNOSIS — I10 Essential (primary) hypertension: Secondary | ICD-10-CM

## 2023-06-30 DIAGNOSIS — K219 Gastro-esophageal reflux disease without esophagitis: Secondary | ICD-10-CM | POA: Diagnosis not present

## 2023-06-30 DIAGNOSIS — F419 Anxiety disorder, unspecified: Secondary | ICD-10-CM | POA: Diagnosis not present

## 2023-06-30 DIAGNOSIS — Z1211 Encounter for screening for malignant neoplasm of colon: Secondary | ICD-10-CM | POA: Diagnosis not present

## 2023-06-30 DIAGNOSIS — Z1212 Encounter for screening for malignant neoplasm of rectum: Secondary | ICD-10-CM | POA: Diagnosis not present

## 2023-06-30 DIAGNOSIS — R7303 Prediabetes: Secondary | ICD-10-CM | POA: Insufficient documentation

## 2023-06-30 MED ORDER — BUSPIRONE HCL 5 MG PO TABS: 5.0000 mg | ORAL_TABLET | Freq: Two times a day (BID) | ORAL | 1 refills | Status: DC

## 2023-06-30 NOTE — Assessment & Plan Note (Signed)
Recommended to take calcium 600 mg twice daily with Vitamin D 2000 units daily and weight bearing activity 30 mins/5 days a week   

## 2023-06-30 NOTE — Assessment & Plan Note (Signed)
 Dietary modifications encouraged.

## 2023-06-30 NOTE — Assessment & Plan Note (Signed)
 Continues on pravastatin - discussed and encouraged dietary modifications Monitor lipids

## 2023-06-30 NOTE — Assessment & Plan Note (Signed)
 Not well controlled, she is staying active and working on lifestyle modifications On cymbalta - will add buspar 5 mg by mouth twice daily to regimen

## 2023-06-30 NOTE — Assessment & Plan Note (Signed)
 Not able to take maxide consistently- bp well controlled today Will stop medication and have her continue to monitor bp at home Will follow up with appt in 4 weeks.

## 2023-06-30 NOTE — Progress Notes (Signed)
 Careteam: Patient Care Team: Catherine Harlene POUR, NP as PCP - General (Geriatric Medicine)  PLACE OF SERVICE:  [facility]  Advanced Directive information    Allergies  Allergen Reactions   Bactrim [Sulfamethoxazole-Trimethoprim] Hives   Amoxicillin      Causes a bad yeast infection    Lisinopril     REACTION: Cough    Chief Complaint  Patient presents with   Anxiety    HPI:  Discussed the use of AI scribe software for clinical note transcription with the patient, who gave verbal consent to proceed.  History of Present Illness Catherine Gilmore is a 68 year old female with hypertension and hyperlipidemia who presents for a routine follow-up and medication refill.  She is currently taking losartan  for hypertension and has been prescribed a combination of triamterene  and hydrochlorothiazide  (Maxzide ), which she takes inconsistently due to its diuretic effect interfering with her work. She has not taken her blood pressure today but mentions that it is usually monitored at home.  For hyperlipidemia, she is on pravastatin  and attempts dietary modifications, though she finds it challenging due to her work schedule. Her typical lunch includes chicken tenders or macaroni and cheese, which she acknowledges are not heart-healthy choices.  She takes omeprazole  for indigestion and acid reflux, reporting no issues with this medication.  She is on Cymbalta  for mood and anxiety but feels neither sad nor happy, and experiences irritability. She is concerned about her granddaughter's recent suicide attempt, which adds to her anxiety.  She takes a calcium  supplement once a day, which includes vitamin D , and also takes vitamin C and a multivitamin. She has not been taking additional vitamin D  supplements.  She works at [employer], where her schedule is inconsistent, causing stress. 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 She walks a lot at work but does not engage in structured  exercise.   Review of Systems:  Review of Systems  Constitutional:  Negative for chills, fever and weight loss.  HENT:  Negative for tinnitus.   Respiratory:  Negative for cough, sputum production and shortness of breath.   Cardiovascular:  Negative for chest pain, palpitations and leg swelling.  Gastrointestinal:  Negative for abdominal pain, constipation, diarrhea and heartburn.  Genitourinary:  Negative for dysuria, frequency and urgency.  Musculoskeletal:  Negative for back pain, falls, joint pain and myalgias.  Skin: Negative.   Neurological:  Negative for dizziness and headaches.  Psychiatric/Behavioral:  Negative for depression and memory loss. The patient is nervous/anxious. The patient does not have insomnia.    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 She started smoking about 18 years ago. She has a 9.1 pack-year smoking history. She has been exposed to tobacco smoke. She has never  used smokeless tobacco. She reports current alcohol use of about 3.0 standard drinks of alcohol per week. She reports that she does not use drugs.  Family History  Problem Relation Age of Onset   Cancer Mother        lung   Eczema Father    Cirrhosis Father     Medications: Patient's Medications  New Prescriptions   BUSPIRONE (BUSPAR) 5 MG TABLET    Take 1 tablet (5 mg total) by mouth 2  (two) times daily.  Previous Medications   CALCIUM  CARBONATE-VIT D-MIN (CALCIUM  600+D3 PLUS MINERALS) 600-800 MG-UNIT TABS    Take 1 tablet by mouth daily.   CHOLECALCIFEROL (VITAMIN D3) 50 MCG (2000 UT) CAPS    Take 1 capsule (2,000 Units total) by mouth daily.   CLOBETASOL CREAM (TEMOVATE) 0.05 %    Apply 1 Application topically daily. Per dermatology   DULOXETINE  (CYMBALTA ) 60 MG CAPSULE    Take 1 capsule (60 mg total) by mouth daily.   EQ ALLERGY RELIEF, CETIRIZINE , 10 MG TABLET    TAKE 1 TABLET BY MOUTH AT BEDTIME   LORAZEPAM  (ATIVAN ) 0.5 MG TABLET    Take 1 tablet (0.5 mg total) by mouth as needed for anxiety (daily).   LOSARTAN  (COZAAR ) 50 MG TABLET    TAKE 1 TABLET EVERY DAY   NYSTATIN  (MYCOSTATIN /NYSTOP ) POWDER    Apply 1 Application topically 3 (three) times daily.   OMEPRAZOLE  (PRILOSEC) 20 MG CAPSULE    TAKE 1 CAPSULE EVERY DAY   ONDANSETRON  (ZOFRAN ) 4 MG TABLET    Take 1 tablet (4 mg total) by mouth every 6 (six) hours.   PRAVASTATIN  (PRAVACHOL ) 20 MG TABLET    Take 1 tablet (20 mg total) by mouth daily.   ROFLUMILAST (ZORYVE) 0.3 % CREA    Apply 1 Application topically as directed. By dermatologist   TRIAMTERENE -HYDROCHLOROTHIAZIDE  (MAXZIDE -25) 37.5-25 MG TABLET    Take 1 tablet by mouth daily.  Modified Medications   No medications on file  Discontinued Medications   No medications on file    Physical Exam:  Vitals:   06/30/23 1127  BP: 114/78  Pulse: 80  Temp: (!) 97.4 F (36.3 C)  SpO2: 99%  Weight: 164 lb 6.4 oz (74.6 kg)  Height: 5' 4 (1.626 m)   Body mass index is 28.22 kg/m. Wt Readings from Last 3 Encounters:  06/30/23 164 lb 6.4 oz (74.6 kg)  11/30/22 176 lb 5.9 oz (80 kg)  11/01/22 176 lb 12.8 oz (80.2 kg)    Physical Exam Constitutional:      General: She is not in acute distress.    Appearance: She is well-developed. She is not diaphoretic.  HENT:     Head: Normocephalic and atraumatic.     Mouth/Throat:     Pharynx: No oropharyngeal exudate.    Eyes:     Conjunctiva/sclera: Conjunctivae normal.     Pupils: Pupils are equal, round, and reactive to light.    Cardiovascular:     Rate and Rhythm: Normal rate and regular rhythm.     Heart sounds: Normal heart sounds.  Pulmonary:     Effort: Pulmonary effort is normal.     Breath sounds: Normal breath sounds.  Abdominal:     General: Bowel sounds are normal.     Palpations: Abdomen is soft.   Musculoskeletal:     Cervical back: Normal range of motion and neck supple.     Right lower leg: No edema.     Left  lower leg: No edema.   Skin:    General: Skin is warm and dry.   Neurological:     Mental Status: She is alert.   Psychiatric:        Mood and Affect: Mood normal.     Labs reviewed: Basic Metabolic Panel: Recent Labs    11/30/22 1322  NA 139  K 3.9  CL 108  CO2 23  GLUCOSE 117*  BUN 12  CREATININE 0.86  CALCIUM  9.1   Liver Function Tests: Recent Labs    11/30/22 1322  AST 31  ALT 37  ALKPHOS 47  BILITOT 0.7  PROT 6.6  ALBUMIN 3.7   Recent Labs    11/30/22 1322  LIPASE 28   No results for input(s): AMMONIA in the last 8760 hours. CBC: Recent Labs    11/30/22 1322  WBC 7.7  HGB 14.6  HCT 42.7  MCV 91.8  PLT 331   Lipid Panel: No results for input(s): CHOL, HDL, LDLCALC, TRIG, CHOLHDL, LDLDIRECT in the last 8760 hours. TSH: No results for input(s): TSH in the last 8760 hours. A1C: Lab Results  Component Value Date   HGBA1C 5.8 (H) 06/17/2022     Assessment/Plan Osteopenia, unspecified location Assessment & Plan: Recommended to take calcium  600 mg twice daily with Vitamin D  2000 units daily and weight bearing activity 30 mins/5 days a week    Anxiety Assessment & Plan: Not well controlled, she is staying active and working on lifestyle modifications On cymbalta - will add buspar 5 mg by mouth twice daily to regimen  Orders: -     busPIRone HCl; Take 1 tablet (5 mg total) by mouth 2 (two) times daily.   Dispense: 60 tablet; Refill: 1  Prediabetes Assessment & Plan: Dietary modifications encouraged  Orders: -     Hemoglobin A1c  Essential hypertension Assessment & Plan: Not able to take maxide consistently- bp well controlled today Will stop medication and have her continue to monitor bp at home Will follow up with appt in 4 weeks.   Orders: -     CBC with Differential/Platelet -     COMPLETE METABOLIC PANEL WITHOUT GFR  Hyperlipidemia LDL goal <100 Assessment & Plan: Continues on pravastatin - discussed and encouraged dietary modifications Monitor lipids  Orders: -     Lipid panel  Gastroesophageal reflux disease, unspecified whether esophagitis present Assessment & Plan: Controlled on omprazole.    Encounter for colorectal cancer screening -     Cologuard     Return in about 6 months (around 12/30/2023) for routine follow up.  Tasia Liz K. Catherine BODILY [facility] [phone]

## 2023-06-30 NOTE — Assessment & Plan Note (Signed)
Controlled on omprazole.   

## 2023-06-30 NOTE — Patient Instructions (Addendum)
 Please get shingles and TDAP booster at local pharmacy   Increase calcium  and vit d twice daily- breakfast and supper  Cologuard needs to be completed.

## 2023-07-01 ENCOUNTER — Ambulatory Visit: Payer: Self-pay | Admitting: Nurse Practitioner

## 2023-07-01 LAB — COMPLETE METABOLIC PANEL WITHOUT GFR
AG Ratio: 2.3 (calc) (ref 1.0–2.5)
ALT: 18 U/L (ref 6–29)
AST: 17 U/L (ref 10–35)
Albumin: 4.1 g/dL (ref 3.6–5.1)
Alkaline phosphatase (APISO): 53 U/L (ref 37–153)
BUN: 13 mg/dL (ref 7–25)
CO2: 28 mmol/L (ref 20–32)
Calcium: 9.4 mg/dL (ref 8.6–10.4)
Chloride: 106 mmol/L (ref 98–110)
Creat: 0.76 mg/dL (ref 0.50–1.05)
Globulin: 1.8 g/dL — ABNORMAL LOW (ref 1.9–3.7)
Glucose, Bld: 86 mg/dL (ref 65–139)
Potassium: 3.7 mmol/L (ref 3.5–5.3)
Sodium: 142 mmol/L (ref 135–146)
Total Bilirubin: 0.6 mg/dL (ref 0.2–1.2)
Total Protein: 5.9 g/dL — ABNORMAL LOW (ref 6.1–8.1)

## 2023-07-01 LAB — HEMOGLOBIN A1C
Hgb A1c MFr Bld: 5.9 % — ABNORMAL HIGH (ref <5.7)
Mean Plasma Glucose: 123 mg/dL
eAG (mmol/L): 6.8 mmol/L

## 2023-07-01 LAB — CBC WITH DIFFERENTIAL/PLATELET
Absolute Lymphocytes: 2592 {cells}/uL (ref 850–3900)
Absolute Monocytes: 558 {cells}/uL (ref 200–950)
Basophils Absolute: 72 {cells}/uL (ref 0–200)
Basophils Relative: 0.8 %
Eosinophils Absolute: 630 {cells}/uL — ABNORMAL HIGH (ref 15–500)
Eosinophils Relative: 7 %
HCT: 44 % (ref 35.0–45.0)
Hemoglobin: 14.2 g/dL (ref 11.7–15.5)
MCH: 31.6 pg (ref 27.0–33.0)
MCHC: 32.3 g/dL (ref 32.0–36.0)
MCV: 97.8 fL (ref 80.0–100.0)
MPV: 8.9 fL (ref 7.5–12.5)
Monocytes Relative: 6.2 %
Neutro Abs: 5148 {cells}/uL (ref 1500–7800)
Neutrophils Relative %: 57.2 %
Platelets: 311 10*3/uL (ref 140–400)
RBC: 4.5 10*6/uL (ref 3.80–5.10)
RDW: 12.8 % (ref 11.0–15.0)
Total Lymphocyte: 28.8 %
WBC: 9 10*3/uL (ref 3.8–10.8)

## 2023-07-01 LAB — LIPID PANEL
Cholesterol: 184 mg/dL (ref <200)
HDL: 82 mg/dL (ref >=50)
LDL Cholesterol (Calc): 81 mg/dL
Non-HDL Cholesterol (Calc): 102 mg/dL (ref <130)
Total CHOL/HDL Ratio: 2.2 (calc) (ref <5.0)
Triglycerides: 112 mg/dL (ref <150)

## 2023-07-08 ENCOUNTER — Other Ambulatory Visit: Payer: Self-pay | Admitting: Nurse Practitioner

## 2023-07-08 DIAGNOSIS — E785 Hyperlipidemia, unspecified: Secondary | ICD-10-CM

## 2023-07-09 ENCOUNTER — Telehealth: Payer: Self-pay | Admitting: Nurse Practitioner

## 2023-07-09 NOTE — Telephone Encounter (Signed)
Duplicate, refilled today

## 2023-07-09 NOTE — Telephone Encounter (Signed)
 Copied from CRM [id number]. Topic: Clinical - Medication Refill >> Jul 09, 2023  3:39 PM Zane F wrote: Caller: Rosaline (Medication ManagementTeam)   Calling From: [employer]  Reason: There are calling in reference to the patient's medication pravastatin  (PRAVACHOL ) 20 MG table; They got in touch with the patient and the patient stated that she has been missing taking her prescriptions. They have noted that the patient was late refilling the medication by 26 days (which prompted their call). The patient has 5 pills on hand and in need of refill.   Medication: pravastatin  (PRAVACHOL ) 20 MG tablet  Has the patient contacted their pharmacy? No  This is the patient's preferred pharmacy:  [facility] [address] [place] - [address] [facility] [place] [address] Phone: [phone] Fax: [phone]  Is this the correct pharmacy for this prescription? Yes   Has the prescription been filled recently? No  Is the patient out of the medication? No  Has the patient been seen for an appointment in the last year OR does the patient have an upcoming appointment? Yes  Can we respond through MyChart? No  Agent: Please be advised that Rx refills may take up to 3 business days. We ask that you follow-up with your pharmacy.

## 2023-07-23 ENCOUNTER — Other Ambulatory Visit: Payer: Self-pay | Admitting: Nurse Practitioner

## 2023-07-23 DIAGNOSIS — I10 Essential (primary) hypertension: Secondary | ICD-10-CM

## 2023-07-23 MED ORDER — LOSARTAN POTASSIUM 50 MG PO TABS: 50.0000 mg | ORAL_TABLET | Freq: Every day | ORAL | 3 refills | Status: AC

## 2023-07-23 NOTE — Telephone Encounter (Signed)
 Copied from CRM [id number]. Topic: Clinical - Medication Refill >> Jul 23, 2023 10:51 AM Suzette B wrote: Medication:  losartan  (COZAAR ) 50 MG tablet  Has the patient contacted their pharmacy? No:    This is the patient's preferred pharmacy:  [facility] [address] [place] - [address] [facility] [place] [address] Phone: [phone] Fax: [phone]   Is this the correct pharmacy for this prescription? Yes If no, delete pharmacy and type the correct one.   Has the prescription been filled recently? Yes  Is the patient out of the medication? No: patient she has 1 pill left   Has the patient been seen for an appointment in the last year OR does the patient have an upcoming appointment? Yes  Can we respond through MyChart? No  Agent: Please be advised that Rx refills may take up to 3 business days. We ask that you follow-up with your pharmacy.

## 2023-07-23 NOTE — Telephone Encounter (Signed)
 Patient has requested refill on medication Losartan . Patient medication has an end date. Medication pend and sent to covering provider Greig Cluster, NP

## 2023-07-29 ENCOUNTER — Encounter: Payer: Medicare (Managed Care) | Admitting: Nurse Practitioner

## 2023-07-29 DIAGNOSIS — R079 Chest pain, unspecified: Secondary | ICD-10-CM | POA: Diagnosis not present

## 2023-07-29 DIAGNOSIS — R0602 Shortness of breath: Secondary | ICD-10-CM | POA: Diagnosis not present

## 2023-07-29 DIAGNOSIS — R42 Dizziness and giddiness: Secondary | ICD-10-CM | POA: Diagnosis not present

## 2023-07-29 DIAGNOSIS — F17210 Nicotine dependence, cigarettes, uncomplicated: Secondary | ICD-10-CM | POA: Diagnosis not present

## 2023-07-29 DIAGNOSIS — R531 Weakness: Secondary | ICD-10-CM | POA: Diagnosis not present

## 2023-07-29 DIAGNOSIS — R002 Palpitations: Secondary | ICD-10-CM | POA: Diagnosis not present

## 2023-07-29 DIAGNOSIS — Z7982 Long term (current) use of aspirin: Secondary | ICD-10-CM | POA: Diagnosis not present

## 2023-07-29 NOTE — Progress Notes (Signed)
 Patient at Urgent Care being seen.

## 2023-07-29 NOTE — Progress Notes (Signed)
 This encounter was created in error - please disregard.

## 2023-09-05 ENCOUNTER — Other Ambulatory Visit: Payer: Self-pay | Admitting: Nurse Practitioner

## 2023-09-05 DIAGNOSIS — F419 Anxiety disorder, unspecified: Secondary | ICD-10-CM

## 2023-09-22 ENCOUNTER — Other Ambulatory Visit: Payer: Self-pay | Admitting: Nurse Practitioner

## 2023-09-22 DIAGNOSIS — F419 Anxiety disorder, unspecified: Secondary | ICD-10-CM

## 2023-09-22 NOTE — Telephone Encounter (Signed)
 Update : both future appointments are video visits.

## 2023-09-22 NOTE — Telephone Encounter (Deleted)
 Update : both future appointments are video visits.

## 2023-09-22 NOTE — Telephone Encounter (Addendum)
 Controlled substance , last filled 12/26/22 contract dated Sept 2024. Pts next appt in person December 2025. Updated appointment notes to update contact.

## 2023-10-20 ENCOUNTER — Other Ambulatory Visit: Payer: Self-pay | Admitting: Nurse Practitioner

## 2023-10-20 DIAGNOSIS — K219 Gastro-esophageal reflux disease without esophagitis: Secondary | ICD-10-CM

## 2023-10-20 MED ORDER — OMEPRAZOLE 20 MG PO CPDR: 20.0000 mg | DELAYED_RELEASE_CAPSULE | Freq: Every day | ORAL | 0 refills | Status: DC

## 2023-10-20 NOTE — Telephone Encounter (Signed)
 Copied from CRM [id number]. Topic: Clinical - Medication Refill >> Oct 20, 2023  2:10 PM Cherylann S wrote: Medication: omeprazole  (PRILOSEC) 20 MG capsule  Has the patient contacted their pharmacy? Yes (Agent: If no, request that the patient contact the pharmacy for the refill. If patient does not wish to contact the pharmacy document the reason why and proceed with request.) (Agent: If yes, when and what did the pharmacy advise?) Pharmacy does not have it on file  This is the patient's preferred pharmacy:  [facility] [address], [place] - [address] [facility] [place] [address] Phone: [phone] Fax: [phone]  Is this the correct pharmacy for this prescription? Yes If no, delete pharmacy and type the correct one.   Has the prescription been filled recently? No  Is the patient out of the medication? Yes  Has the patient been seen for an appointment in the last year OR does the patient have an upcoming appointment? Yes  Can we respond through MyChart? No  Agent: Please be advised that Rx refills may take up to 3 business days. We ask that you follow-up with your pharmacy.

## 2023-11-04 ENCOUNTER — Encounter: Payer: Self-pay | Admitting: Nurse Practitioner

## 2023-11-04 ENCOUNTER — Ambulatory Visit: Payer: Medicare HMO | Admitting: Nurse Practitioner

## 2023-11-04 ENCOUNTER — Telehealth: Payer: Self-pay | Admitting: *Deleted

## 2023-11-04 DIAGNOSIS — L400 Psoriasis vulgaris: Secondary | ICD-10-CM | POA: Diagnosis not present

## 2023-11-04 DIAGNOSIS — Z0000 Encounter for general adult medical examination without abnormal findings: Secondary | ICD-10-CM

## 2023-11-04 DIAGNOSIS — F17200 Nicotine dependence, unspecified, uncomplicated: Secondary | ICD-10-CM

## 2023-11-04 DIAGNOSIS — Z1231 Encounter for screening mammogram for malignant neoplasm of breast: Secondary | ICD-10-CM

## 2023-11-04 NOTE — Telephone Encounter (Signed)
 [name], Catherine Gilmore are scheduled for a virtual visit with your provider today.    Just as we do with appointments in the office, we must obtain your consent to participate.  Your consent will be active for this visit and any virtual visit you Catherine Gilmore have with one of our providers in the next 365 days.    If you have a MyChart account, I can also send a copy of this consent to you electronically.  All virtual visits are billed to your insurance company just like a traditional visit in the office.  As this is a virtual visit, video technology does not allow for your provider to perform a traditional examination.  This Catherine Gilmore limit your provider's ability to fully assess your condition.  If your provider identifies any concerns that need to be evaluated in person or the need to arrange testing such as labs, EKG, etc, we will make arrangements to do so.    Although advances in technology are sophisticated, we cannot ensure that it will always work on either your end or our end.  If the connection with a video visit is poor, we Catherine Gilmore have to switch to a telephone visit.  With either a video or telephone visit, we are not always able to ensure that we have a secure connection.   I need to obtain your verbal consent now.   Are you willing to proceed with your visit today?   Catherine Gilmore has provided verbal consent on 11/04/2023 for a virtual visit (video or telephone).   Catherine Gilmore LABOR, CMA 11/04/2023  10:56 AM

## 2023-11-04 NOTE — Patient Instructions (Addendum)
  Catherine Gilmore , Thank you for taking time to come for your Medicare Wellness Visit. I appreciate your ongoing commitment to your health goals. Please review the following plan we discussed and let me know if I can assist you in the future.   Shingles and TDAP vaccine- given at pharmacy Pneumonia vaccine- pharmacy or office NEED colorectal cancer screening NEED mammogram   This is a list of the screening recommended for you and due dates:  Health Maintenance  Topic Date Due   Zoster (Shingles) Vaccine (1 of 2) Never done   Cologuard (Stool DNA test)  Never done   Pneumococcal Vaccine for age over 34 (2 of 2 - PCV) 08/14/2017   DTaP/Tdap/Td vaccine (2 - Td or Tdap) 01/24/2021   Breast Cancer Screening  05/14/2024   Medicare Annual Wellness Visit  11/03/2024   DEXA scan (bone density measurement)  Completed   Hepatitis C Screening  Completed   Meningitis B Vaccine  Aged Out   COVID-19 Vaccine  Discontinued

## 2023-11-04 NOTE — Progress Notes (Signed)
 Subjective:   Catherine Gilmore is a 68 y.o. female who presents for Medicare Annual (Subsequent) preventive examination.  Visit Complete: Virtual I connected with  Catherine Gilmore on 11/04/23 by a video and audio enabled telemedicine application and verified that I am speaking with the correct person using two identifiers.  Patient Location: Home  Provider Location: Office/Clinic  I discussed the limitations of evaluation and management by telemedicine. The patient expressed understanding and agreed to proceed.  Vital Signs: Because this visit was a virtual/telehealth visit, some criteria may be missing or patient reported. Any vitals not documented were not able to be obtained and vitals that have been documented are patient reported.   Cardiac Risk Factors include: advanced age (>67men, >101 women);dyslipidemia;hypertension;obesity (BMI >30kg/m2);sedentary lifestyle;smoking/ tobacco exposure     Objective:    Today's Vitals   11/04/23 0931  PainSc: 6    There is no height or weight on file to calculate BMI.     11/04/2023    9:15 AM 03/28/2023    7:41 AM 11/30/2022   12:54 PM 11/01/2022   10:17 AM 07/26/2022    9:56 AM 07/18/2022    3:34 PM 07/12/2022    9:10 AM  Advanced Directives  Does Patient Have a Medical Advance Directive? No No No No No No No  Would patient like information on creating a medical advance directive? No - Patient declined No - Patient declined No - Patient declined Yes (MAU/Ambulatory/Procedural Areas - Information given) No - Patient declined No - Patient declined No - Patient declined    Current Medications (verified) Outpatient Encounter Medications as of 11/04/2023  Medication Sig   DULoxetine  (CYMBALTA ) 60 MG capsule Take 1 capsule by mouth once daily   LORazepam  (ATIVAN ) 0.5 MG tablet TAKE 1 TABLET BY MOUTH ONCE DAILY AS NEEDED FOR ANXIETY   losartan  (COZAAR ) 50 MG tablet Take 1 tablet (50 mg total) by mouth daily.   omeprazole  (PRILOSEC) 20 MG  capsule Take 1 capsule (20 mg total) by mouth daily.   pravastatin  (PRAVACHOL ) 20 MG tablet Take 1 tablet by mouth once daily   risankizumab-rzaa (SKYRIZI) 150 MG/ML SOSY prefilled syringe Inject 150 mg into the skin every 3 (three) months.   Calcium  Carbonate-Vit D-Min (CALCIUM  600+D3 PLUS MINERALS) 600-800 MG-UNIT TABS Take 1 tablet by mouth daily. (Patient not taking: Reported on 11/04/2023)   Roflumilast (ZORYVE) 0.3 % CREA Apply 1 Application topically as directed. By dermatologist (Patient not taking: Reported on 11/04/2023)   [DISCONTINUED] busPIRone  (BUSPAR ) 5 MG tablet Take 1 tablet (5 mg total) by mouth 2 (two) times daily. (Patient not taking: Reported on 11/04/2023)   [DISCONTINUED] Cholecalciferol (VITAMIN D3) 50 MCG (2000 UT) CAPS Take 1 capsule (2,000 Units total) by mouth daily. (Patient not taking: Reported on 11/04/2023)   [DISCONTINUED] clobetasol cream (TEMOVATE) 0.05 % Apply 1 Application topically daily. Per dermatology (Patient not taking: Reported on 11/04/2023)   [DISCONTINUED] EQ ALLERGY RELIEF, CETIRIZINE , 10 MG tablet TAKE 1 TABLET BY MOUTH AT BEDTIME (Patient not taking: Reported on 11/04/2023)   [DISCONTINUED] nystatin  (MYCOSTATIN /NYSTOP ) powder Apply 1 Application topically 3 (three) times daily. (Patient not taking: Reported on 11/04/2023)   [DISCONTINUED] ondansetron  (ZOFRAN ) 4 MG tablet Take 1 tablet (4 mg total) by mouth every 6 (six) hours. (Patient not taking: Reported on 11/04/2023)   [DISCONTINUED] triamterene -hydrochlorothiazide  (MAXZIDE -25) 37.5-25 MG tablet Take 1 tablet by mouth daily. (Patient not taking: Reported on 11/04/2023)   No facility-administered encounter medications on file as of 11/04/2023.  Allergies (verified) Bactrim [sulfamethoxazole-trimethoprim], Amoxicillin , and Lisinopril   History: Past Medical History:  Diagnosis Date   Anxiety    Back pain, chronic    Eczema    Flushing    GERD (gastroesophageal reflux disease)    Heart  palpitations 03/2019   History of gestational diabetes    History of sinus problem    History of sinus problem    Hyperlipidemia    Hypertension    Hypokalemia    Mitral valve prolapse    Polycythemia    Supraventricular tachycardia, paroxysmal 01/1998,02/1998   s/p ablation and cardioversion    Urticaria    Past Surgical History:  Procedure Laterality Date   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   SVT ablation     x2   Family History  Problem Relation Age of Onset   Cancer Mother        lung   Eczema Father    Cirrhosis Father    Social History   Socioeconomic History   Marital status: Married    Spouse name: Not on file   Number of children: Not on file   Years of education: Not on file   Highest education level: Not on file  Occupational History   Not on file  Tobacco Use   Smoking status: Every Day    Current packs/day: 0.50    Average packs/day: 0.5 packs/day for 18.6 years (9.3 ttl pk-yrs)    Types: Cigarettes    Start date: 04/07/2005    Passive exposure: Current   Smokeless tobacco: Never  Vaping Use   Vaping status: Never Used  Substance and Sexual Activity   Alcohol use: Yes    Alcohol/week: 3.0 standard drinks of alcohol    Types: 3 Cans of beer per week   Drug use: No   Sexual activity: Yes  Other Topics Concern   Not on file  Social History Narrative   Not on file   Social Drivers of Health   Financial Resource Strain: Not on file  Food Insecurity: No Food Insecurity (11/04/2023)   Hunger Vital Sign    Worried About Running Out of Food in the Last Year: Never true    Ran Out of Food in the Last Year: Never true  Transportation Needs: No Transportation Needs (11/04/2023)   PRAPARE - Administrator, Civil Service (Medical): No    Lack of Transportation (Non-Medical): No  Physical Activity: Not on file  Stress: Not on file  Social Connections: Unknown (05/18/2021)   Received from [facility]   Social Network    Social Network:  Not on file    Tobacco Counseling Ready to quit: Not Answered Counseling given: Not Answered   Clinical Intake:  Pre-visit preparation completed: Yes  Pain : 0-10 Pain Score: 6  Pain Type: Chronic pain Pain Location: Back Pain Orientation: Upper, Mid, Lower Pain Descriptors / Indicators: Aching     BMI - recorded: 28 Nutritional Status: BMI 25 -29 Overweight  How often do you need to have someone help you when you read instructions, pamphlets, or other written materials from your doctor or pharmacy?: 1 - Never         Activities of Daily Living    11/04/2023    9:29 AM  In your present state of health, do you have any difficulty performing the following activities:  Hearing? 1  Vision? 0  Difficulty concentrating or making decisions? 0  Walking or climbing stairs? 0  Dressing or  bathing? 0  Doing errands, shopping? 0  Preparing Food and eating ? N  Using the Toilet? N  In the past six months, have you accidently leaked urine? N  Do you have problems with loss of bowel control? N  Managing your Medications? N  Managing your Finances? N  Housekeeping or managing your Housekeeping? N    Patient Care Team: [name], NP as PCP - General (Geriatric Medicine)  Indicate any recent Medical Services you may have received from other than [facility] providers in the past year (date may be approximate).     Assessment:   This is a routine wellness examination for Catherine Gilmore.  Hearing/Vision screen Vision Screening - Comments:: [facility] Last Exam: 01/2023   Goals Addressed   None    Depression Screen    11/04/2023    9:14 AM 06/30/2023   11:31 AM 03/28/2023    7:42 AM 11/01/2022   10:14 AM 09/27/2022   10:45 AM 07/26/2022    9:56 AM 07/12/2022   10:47 AM  PHQ 2/9 Scores  PHQ - 2 Score 0 4 0 0 0 0 0  PHQ- 9 Score  13    0     Fall Risk    11/04/2023    9:14 AM 06/30/2023   11:31 AM 03/28/2023    7:42 AM 11/01/2022   10:12 AM 09/27/2022    10:44 AM  Fall Risk   Falls in the past year? 0 0 0 0 0  Number falls in past yr: 0 0 0 0 0  Injury with Fall? 0 0 0 0 0  Risk for fall due to : No Fall Risks No Fall Risks No Fall Risks No Fall Risks No Fall Risks  Follow up Falls evaluation completed Falls evaluation completed Falls evaluation completed Falls evaluation completed Falls evaluation completed    MEDICARE RISK AT HOME: Medicare Risk at Home Any stairs in or around the home?: No Home free of loose throw rugs in walkways, pet beds, electrical cords, etc?: Yes Adequate lighting in your home to reduce risk of falls?: Yes Life alert?: No Use of a cane, walker or w/c?: No Grab bars in the bathroom?: No Shower chair or bench in shower?: No  TIMED UP AND GO:  Was the test performed?  No    Cognitive Function:    11/01/2022   10:21 AM 10/16/2020   11:43 AM  MMSE - Mini Mental State Exam  Orientation to time 5 5  Orientation to Place 5 5  Registration 3 3  Attention/ Calculation 5 5  Recall 3 3  Language- name 2 objects 2 2  Language- repeat 1 1  Language- follow 3 step command 2 3  Language- read & follow direction 1 1  Write a sentence 1 1  Copy design 1 1  Total score 29 30        11/04/2023    9:15 AM 10/18/2021    9:53 AM  6CIT Screen  What Year? 0 points 0 points  What month? 0 points 0 points  What time? 0 points 0 points  Count back from 20 0 points 0 points  Months in reverse 0 points 0 points  Repeat phrase  0 points  Total Score  0 points    Immunizations Immunization History  Administered Date(s) Administered   Influenza Split 01/24/2011, 10/17/2011   Influenza Whole 10/02/2007, 10/05/2008, 11/22/2009   Influenza,trivalent, recombinat, inj, PF 01/24/2011, 10/17/2011   Pneumococcal Polysaccharide-23 08/14/2016  Tdap 01/25/2011    TDAP status: Up to date  Flu Vaccine status: Declined, Education has been provided regarding the importance of this vaccine but patient still declined.  Advised may receive this vaccine at local pharmacy or Health Dept. Aware to provide a copy of the vaccination record if obtained from local pharmacy or Health Dept. Verbalized acceptance and understanding.  Pneumococcal vaccine status: Due, Education has been provided regarding the importance of this vaccine. Advised may receive this vaccine at local pharmacy or Health Dept. Aware to provide a copy of the vaccination record if obtained from local pharmacy or Health Dept. Verbalized acceptance and understanding.  Covid-19 vaccine status: Information provided on how to obtain vaccines.   Qualifies for Shingles Vaccine? Yes   Zostavax completed No   Shingrix Completed?: No.    Education has been provided regarding the importance of this vaccine. Patient has been advised to call insurance company to determine out of pocket expense if they have not yet received this vaccine. Advised may also receive vaccine at local pharmacy or Health Dept. Verbalized acceptance and understanding.  Screening Tests Health Maintenance  Topic Date Due   Zoster Vaccines- Shingrix (1 of 2) Never done   Fecal DNA (Cologuard)  Never done   Pneumococcal Vaccine: 50+ Years (2 of 2 - PCV) 08/14/2017   DTaP/Tdap/Td (2 - Td or Tdap) 01/24/2021   Mammogram  05/14/2024   Medicare Annual Wellness (AWV)  11/03/2024   DEXA SCAN  Completed   Hepatitis C Screening  Completed   Meningococcal B Vaccine  Aged Out   COVID-19 Vaccine  Discontinued    Health Maintenance  Health Maintenance Due  Topic Date Due   Zoster Vaccines- Shingrix (1 of 2) Never done   Fecal DNA (Cologuard)  Never done   Pneumococcal Vaccine: 50+ Years (2 of 2 - PCV) 08/14/2017   DTaP/Tdap/Td (2 - Td or Tdap) 01/24/2021   Declines screening for colorectal cancer- discussed colonoscopy and cologuard- can not bring myself to do it  Mammogram status: Ordered today. Pt provided with contact info and advised to call to schedule appt.   Bone Density  status: Completed 03/2022. Results reflect: Bone density results: OSTEOPENIA. Repeat every 2 years.  Lung Cancer Screening: (Low Dose CT Chest recommended if Age 36-80 years, 20 pack-year currently smoking OR have quit w/in 15years.) does qualify.   Lung Cancer Screening Referral: completed  Additional Screening:  Hepatitis C Screening: does qualify; Completed  Vision Screening: Recommended annual ophthalmology exams for early detection of glaucoma and other disorders of the eye. Is the patient up to date with their annual eye exam?  Yes  Who is the provider or what is the name of the office in which the patient attends annual eye exams? [facility] care center If pt is not established with a provider, would they like to be referred to a provider to establish care? No .   Dental Screening: Recommended annual dental exams for proper oral hygiene   Community Resource Referral / Chronic Care Management: CRR required this visit?  No   CCM required this visit?  No     Plan:     I have personally reviewed and noted the following in the patient's chart:   Medical and social history Use of alcohol, tobacco or illicit drugs  Current medications and supplements including opioid prescriptions. Patient is not currently taking opioid prescriptions. Functional ability and status Nutritional status Physical activity Advanced directives List of other physicians Hospitalizations, surgeries, and ER  visits in previous 12 months Vitals Screenings to include cognitive, depression, and falls Referrals and appointments  In addition, I have reviewed and discussed with patient certain preventive protocols, quality metrics, and best practice recommendations. A written personalized care plan for preventive services as well as general preventive health recommendations were provided to patient.     Harlene MARLA An, NP   11/04/2023   After Visit Summary: (MyChart) Due to this being a telephonic visit,  the after visit summary with patients personalized plan was offered to patient via MyChart

## 2023-11-04 NOTE — Progress Notes (Signed)
  This service is provided via telemedicine  No vital signs collected/recorded due to the encounter was a telemedicine visit.   Location of patient (ex: home, work):  Home  Patient consents to a telephone visit:  Yes  Location of the provider (ex: office, home):  Office [place].   Name of any referring provider:  na  Names of all persons participating in the telemedicine service and their role in the encounter:  Catherine Gilmore, Patient, [name], CMA, Harlene An, NP  Time spent on call:  7:10

## 2023-12-25 ENCOUNTER — Ambulatory Visit: Payer: Self-pay

## 2023-12-25 DIAGNOSIS — R0981 Nasal congestion: Secondary | ICD-10-CM | POA: Diagnosis not present

## 2023-12-25 DIAGNOSIS — R051 Acute cough: Secondary | ICD-10-CM | POA: Diagnosis not present

## 2023-12-25 DIAGNOSIS — J018 Other acute sinusitis: Secondary | ICD-10-CM | POA: Diagnosis not present

## 2023-12-25 DIAGNOSIS — J069 Acute upper respiratory infection, unspecified: Secondary | ICD-10-CM | POA: Diagnosis not present

## 2023-12-25 DIAGNOSIS — R0982 Postnasal drip: Secondary | ICD-10-CM | POA: Diagnosis not present

## 2023-12-25 NOTE — Telephone Encounter (Signed)
 FYI Only or Action Required?: FYI only for provider: appointment scheduled on 12/22 however pt is going to urgent care tonight after work.  Patient was last seen in primary care on 11/04/2023 by Catherine Harlene POUR, NP.  Called Nurse Triage reporting Nasal Congestion.  Symptoms began several weeks ago.  Interventions attempted: abx in October, pt unable to specify name of abx  Symptoms are: gradually worsening.  Triage Disposition: See PCP When Office is Open (Within 3 Days)  Patient/caregiver understands and will follow disposition?: Yes      Pt states she will go to UC after work tonight at [employer].     Copied from CRM [id number]. Topic: Clinical - Red Word Triage >> Dec 25, 2023 11:29 AM Graeme ORN wrote: Red Word that prompted transfer to Nurse Triage: Head  and chest congestion - green/discolored mucous - 7/8 weeks previously seen in urgent care given antibiotic Reason for Disposition  [1] Nasal discharge AND [2] present > 10 days  Answer Assessment - Initial Assessment Questions 1. ONSET: When did the nasal discharge start?      7-8 weeks ago  2. AMOUNT: How much discharge is there?      Quite a bit, especially in the morning  3. COUGH: Do you have a cough? If Yes, ask: Describe the color of your mucus. (e.g., clear, white, yellow, green)    Productive cough, green mucus  4. RESPIRATORY DISTRESS: Describe your breathing.      Reports a little bit of SOB  5. FEVER: Do you have a fever? If Yes, ask: What is your temperature, how was it measured, and when did it start?     Denies  6. SEVERITY: Overall, how bad are you feeling right now? (e.g., doesn't interfere with normal activities, staying home from school/work, staying in bed)      Coughing more today per patient, I don't feel too bad, I feel kind of tired.  7. OTHER SYMPTOMS: Do you have any other symptoms? (e.g., earache, mouth sores, sore throat, wheezing) Head and chest congestion with  green mucus  Took abx at end of October which improved her symptoms but didn't clear it up  Protocols used: Common Cold-A-AH

## 2023-12-29 ENCOUNTER — Encounter: Payer: Medicare (Managed Care) | Admitting: Nurse Practitioner

## 2023-12-29 ENCOUNTER — Encounter: Payer: Self-pay | Admitting: Nurse Practitioner

## 2023-12-29 NOTE — Progress Notes (Unsigned)
   This service is provided via telemedicine  No vital signs collected/recorded due to the encounter was a telemedicine visit.   Location of patient (ex: home, work):  Home  Patient consents to a telephone visit: Yes  Location of the provider (ex: office, home):  [facility], Office   Name of any referring provider:  N/A  Names of all persons participating in the telemedicine service and their role in the encounter:  S.Chrae B/CMA, Abbey Chatters, NP, and Patient   Time spent on call:  9 min with medical assistant

## 2023-12-30 NOTE — Progress Notes (Signed)
 This encounter was created in error - please disregard.

## 2023-12-31 ENCOUNTER — Other Ambulatory Visit: Payer: Self-pay | Admitting: Nurse Practitioner

## 2023-12-31 DIAGNOSIS — E785 Hyperlipidemia, unspecified: Secondary | ICD-10-CM

## 2024-01-02 DIAGNOSIS — J060 Acute laryngopharyngitis: Secondary | ICD-10-CM | POA: Diagnosis not present

## 2024-01-02 DIAGNOSIS — R0981 Nasal congestion: Secondary | ICD-10-CM | POA: Diagnosis not present

## 2024-01-02 DIAGNOSIS — R051 Acute cough: Secondary | ICD-10-CM | POA: Diagnosis not present

## 2024-01-02 DIAGNOSIS — R0982 Postnasal drip: Secondary | ICD-10-CM | POA: Diagnosis not present

## 2024-01-09 ENCOUNTER — Encounter: Payer: Self-pay | Admitting: Nurse Practitioner

## 2024-01-09 ENCOUNTER — Ambulatory Visit: Payer: Medicare (Managed Care) | Admitting: Nurse Practitioner

## 2024-01-09 ENCOUNTER — Ambulatory Visit: Payer: Self-pay | Admitting: Nurse Practitioner

## 2024-01-09 VITALS — BP 136/82 | HR 99 | Temp 97.8°F | Ht 64.0 in | Wt 160.4 lb

## 2024-01-09 DIAGNOSIS — K047 Periapical abscess without sinus: Secondary | ICD-10-CM

## 2024-01-09 DIAGNOSIS — I10 Essential (primary) hypertension: Secondary | ICD-10-CM

## 2024-01-09 DIAGNOSIS — R7303 Prediabetes: Secondary | ICD-10-CM | POA: Diagnosis not present

## 2024-01-09 DIAGNOSIS — E785 Hyperlipidemia, unspecified: Secondary | ICD-10-CM

## 2024-01-09 MED ORDER — FLUCONAZOLE 150 MG PO TABS: ORAL_TABLET | ORAL | 0 refills | Status: AC

## 2024-01-09 MED ORDER — AMOXICILLIN-POT CLAVULANATE 875-125 MG PO TABS: 1.0000 | ORAL_TABLET | Freq: Two times a day (BID) | ORAL | 0 refills | Status: AC

## 2024-01-09 NOTE — Telephone Encounter (Signed)
 See triage notes from triage nurse.

## 2024-01-09 NOTE — Progress Notes (Signed)
 "   Careteam: Patient Care Team: [name], NP as PCP - General (Geriatric Medicine)  PLACE OF SERVICE:  [facility]  Advanced Directive information    Allergies[1]  Chief Complaint  Patient presents with   Dental Pain    Right side, no appointment yet with the dentist. Ongoing since Monday. She has been taking hydrochlorothiazide  every other day. Not sure of the dosage for BP    HPI:  Discussed the use of AI scribe software for clinical note transcription with the patient, who gave verbal consent to proceed.  History of Present Illness Catherine Gilmore is a 69 year old female who presents with mouth pain.  She has been experiencing severe pain in a tooth on the right side of her mouth for the past four to five days, necessitating the removal of her partial denture due to irritation. The pain is radiating, causing swelling, and is sensitive to touch, with a sensation of pain extending to her brain. There is a noticeable swelling on the outside of her gum to the right upper area.   She has been taking cefdinir for a sinus infection for three days, along with over-the-counter ibuprofen for pain relief. The antibiotic has not alleviated her mouth pain. She experienced chills the previous night but denies having a fever. She has nasal congestion and a cough, which she refers to as 'the crud,' and notes that the mucus is no longer green.  Her current medications include cefdinir 300 mg for sinus infection, ibuprofen 200 mg (two tablets every four hours) for pain  She is taking cymbalta  for mood, and pravastatin  for cholesterol management.   Review of Systems:  Review of Systems  Constitutional:  Positive for chills. Negative for fever and weight loss.  HENT:  Positive for congestion. Negative for tinnitus.   Respiratory:  Positive for cough. Negative for sputum production and shortness of breath.   Musculoskeletal:  Negative for back pain, falls, joint pain and myalgias.   Skin: Negative.   Neurological:  Negative for dizziness and headaches.  Psychiatric/Behavioral:  Negative for depression and memory loss. The patient does not have insomnia.    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 She started smoking about 18 years ago. She has a 9.4 pack-year smoking history. She has been exposed to tobacco smoke. She has never used smokeless tobacco. She reports current alcohol use of about 3.0 standard drinks of alcohol per week. She reports that she does not use drugs.  Family History  Problem Relation Age of Onset   Cancer Mother        lung   Eczema Father    Cirrhosis Father     Medications: Patient's Medications  New Prescriptions   No medications on file  Previous Medications   CEFDINIR (OMNICEF) 300 MG CAPSULE    Take 300 mg by mouth 2 (two) times daily.   DULOXETINE  (CYMBALTA ) 60 MG CAPSULE    Take 1 capsule by mouth once daily   LORAZEPAM  (ATIVAN ) 0.5 MG TABLET    TAKE 1 TABLET BY MOUTH ONCE DAILY AS NEEDED FOR ANXIETY   LOSARTAN  (COZAAR ) 50 MG TABLET    Take 1 tablet (50 mg total) by mouth daily.   OMEPRAZOLE  (PRILOSEC) 20 MG CAPSULE    Take 1 capsule (20 mg total) by mouth daily.   PRAVASTATIN  (PRAVACHOL ) 20 MG TABLET    Take 1 tablet by mouth once daily   RISANKIZUMAB-RZAA (SKYRIZI) 150 MG/ML SOSY PREFILLED SYRINGE    Inject 150 mg into the skin every  3 (three) months.  Modified Medications   No medications on file  Discontinued Medications   No medications on file    Physical Exam:  Vitals:   01/09/24 1321  BP: 136/82  Pulse: 99  Temp: 97.8 F (36.6 C)  TempSrc: Temporal  SpO2: 100%  Weight: 160 lb 6.4 oz (72.8 kg)  Height: 5' 4 (1.626 m)   Body mass index is 27.53 kg/m. Wt Readings from Last 3 Encounters:  01/09/24 160 lb 6.4 oz (72.8 kg)  06/30/23 164 lb 6.4 oz (74.6 kg)  11/30/22 176 lb 5.9 oz (80 kg)    Physical Exam Constitutional:      General: She is not in acute distress.    Appearance: She is well-developed. She is not diaphoretic.  HENT:     Head: Normocephalic and atraumatic.     Mouth/Throat:     Dentition: Abnormal dentition. Dental abscesses (to right upper gum) present.     Pharynx: No oropharyngeal exudate.  Eyes:     Conjunctiva/sclera: Conjunctivae normal.     Pupils: Pupils are equal, round, and reactive to light.  Cardiovascular:     Rate and Rhythm: Normal rate and regular rhythm.     Heart sounds: Normal heart sounds.  Pulmonary:     Effort: Pulmonary effort is normal.     Breath sounds: Normal breath sounds.  Abdominal:     General: Bowel sounds are normal.     Palpations: Abdomen is soft.  Musculoskeletal:     Cervical back: Normal range of motion and neck supple.     Right lower leg: No edema.     Left lower leg: No edema.  Skin:    General: Skin is warm and dry.  Neurological:     Mental Status: She is alert.  Psychiatric:        Mood and Affect: Mood normal.     Labs reviewed: Basic Metabolic Panel: Recent Labs    06/30/23 1158  NA 142  K 3.7  CL 106  CO2 28  GLUCOSE 86  BUN 13  CREATININE 0.76  CALCIUM  9.4   Liver Function Tests: Recent Labs    06/30/23 1158  AST 17  ALT 18  BILITOT 0.6  PROT 5.9*   No results for input(s): LIPASE, AMYLASE in the last 8760 hours. No results for input(s): AMMONIA in the last 8760 hours. CBC: Recent Labs     06/30/23 1158  WBC 9.0  NEUTROABS 5,148  HGB 14.2  HCT 44.0  MCV 97.8  PLT 311   Lipid Panel: Recent Labs    06/30/23 1158  CHOL 184  HDL 82  LDLCALC 81  TRIG 112  CHOLHDL 2.2   TSH: No results  for input(s): TSH in the last 8760 hours. A1C: Lab Results  Component Value Date   HGBA1C 5.9 (H) 06/30/2023     Assessment/Plan Assessment and Plan Assessment & Plan Dental abscess Acute dental abscess with ineffective cefdinir. Amoxicillin  necessary despite intolerance due to broad-spectrum need. - Discontinued cefdinir. - Started amoxicillin -clavulanate (Augmentin ) twice daily with food for one week. - Prescribed Diflucan  for potential yeast infection, to be taken if symptoms develop. - Recommended probiotic to maintain gut flora. - Advised warm salt water rinses three times daily. - Instructed to contact dentist for urgent evaluation and scheduling. - Advised alternating ibuprofen and acetaminophen  for pain management.  Essential hypertension Blood pressure well controlled on current regimen. - Continue current antihypertensive medication. - Ordered labs to monitor kidney and liver function due to medication use.  Prediabetes Requires monitoring of blood glucose levels. - Ordered A1c test to monitor glucose control.  Hyperlipidemia Requires liver function monitoring. - Continue pravastatin . - Ordered liver function tests to monitor for potential side effects.    To schedule routine follow up for 3 months.   Catherine Hubka K. Caro BODILY [facility] [phone]      [1]  Allergies Allergen Reactions   Bactrim [Sulfamethoxazole-Trimethoprim] Hives   Amoxicillin      Causes a bad yeast infection    Lisinopril     REACTION: Cough   "

## 2024-01-09 NOTE — Telephone Encounter (Signed)
 FYI Only or Action Required?: FYI only for provider: appointment scheduled on 01/09/24.  Patient was last seen in primary care on 11/04/2023 by [name], NP.  Called Nurse Triage reporting Dental Pain.  Symptoms began today.  Interventions attempted: OTC medications: ibuprofen and Prescription medications: cefdinir.  Symptoms are: gradually worsening.  Triage Disposition: See HCP Within 4 Hours (Or PCP Triage)  Patient/caregiver understands and will follow disposition?: Yes    Copied from CRM [id number]. Topic: Clinical - Red Word Triage >> Jan 09, 2024 11:47 AM Graeme ORN wrote: Red Word that prompted transfer to Nurse Triage: Bad tooth, face swollen, horrible pain - requesting something called in until her dentist appt.    Reason for Disposition  [1] SEVERE pain (e.g., excruciating, unable to eat, unable to do any normal activities) AND [2] not improved 2 hours after pain medicine  Answer Assessment - Initial Assessment Questions Pt called in requesting appt or rx be sent in for tooth pain with associated jawline swelling and pain. Pt reports she was [facility] for a [facility] infection and is taking cefdinir 300mg  BID as well as ibuprofen for pain. Pt states that pain is r/t to tooth located on r upper side. Pt has f/u with dentist but was referred back to PCP for symptom management. Appointment scheduled for evaluation. Patient agrees with plan of care, and will call back if anything changes, or if symptoms worsen. ]    1. LOCATION: Which tooth is hurting?  (e.g., right-side/left-side, upper/lower, front/back)     Right upper, middle   2. ONSET: When did the toothache start?  (e.g., hours, days)      Within the last week   3. SEVERITY: How bad is the toothache?  (Scale 1-10; mild, moderate or severe)     Mod-severe   4. SWELLING: Is there any visible swelling of your face?     Yes; R sided facial swelling around jaw   5. OTHER SYMPTOMS: Do you  have any other symptoms? (e.g., fever)     Denies fever, oral swelling, SOB  Protocols used: Toothache-A-AH

## 2024-01-09 NOTE — Patient Instructions (Addendum)
 STOP current antibiotic  Start augmentin  - take twice daily with food Add probiotic twice daily  Diflucan  Rx sent if you start having yeast to start   Reschedule routine follow up appt   Alternate tylenol  with ibuprofen  Tylenol  325 mg tablet 2 tablets every 6 hours Ibuprofen 200 mg 2 tablets every 4 hours

## 2024-01-10 LAB — CBC WITH DIFFERENTIAL/PLATELET
Absolute Lymphocytes: 2106 {cells}/uL (ref 850–3900)
Absolute Monocytes: 819 {cells}/uL (ref 200–950)
Basophils Absolute: 65 {cells}/uL (ref 0–200)
Basophils Relative: 0.5 %
Eosinophils Absolute: 377 {cells}/uL (ref 15–500)
Eosinophils Relative: 2.9 %
HCT: 43.7 % (ref 35.9–46.0)
Hemoglobin: 14.5 g/dL (ref 11.7–15.5)
MCH: 30.7 pg (ref 27.0–33.0)
MCHC: 33.2 g/dL (ref 31.6–35.4)
MCV: 92.4 fL (ref 81.4–101.7)
MPV: 9.2 fL (ref 7.5–12.5)
Monocytes Relative: 6.3 %
Neutro Abs: 9633 {cells}/uL — ABNORMAL HIGH (ref 1500–7800)
Neutrophils Relative %: 74.1 %
Platelets: 402 Thousand/uL — ABNORMAL HIGH (ref 140–400)
RBC: 4.73 Million/uL (ref 3.80–5.10)
RDW: 12.4 % (ref 11.0–15.0)
Total Lymphocyte: 16.2 %
WBC: 13 Thousand/uL — ABNORMAL HIGH (ref 3.8–10.8)

## 2024-01-10 LAB — COMPREHENSIVE METABOLIC PANEL WITH GFR
AG Ratio: 1.7 (calc) (ref 1.0–2.5)
ALT: 14 U/L (ref 6–29)
AST: 14 U/L (ref 10–35)
Albumin: 4.1 g/dL (ref 3.6–5.1)
Alkaline phosphatase (APISO): 63 U/L (ref 37–153)
BUN: 15 mg/dL (ref 7–25)
CO2: 27 mmol/L (ref 20–32)
Calcium: 9.4 mg/dL (ref 8.6–10.4)
Chloride: 102 mmol/L (ref 98–110)
Creat: 0.67 mg/dL (ref 0.50–1.05)
Globulin: 2.4 g/dL (ref 1.9–3.7)
Glucose, Bld: 81 mg/dL (ref 65–139)
Potassium: 3.5 mmol/L (ref 3.5–5.3)
Sodium: 140 mmol/L (ref 135–146)
Total Bilirubin: 0.7 mg/dL (ref 0.2–1.2)
Total Protein: 6.5 g/dL (ref 6.1–8.1)
eGFR: 95 mL/min/1.73m2

## 2024-01-10 LAB — HEMOGLOBIN A1C
Hgb A1c MFr Bld: 5.5 %
Mean Plasma Glucose: 111 mg/dL
eAG (mmol/L): 6.2 mmol/L

## 2024-01-12 ENCOUNTER — Ambulatory Visit: Payer: Self-pay | Admitting: Nurse Practitioner

## 2024-01-18 ENCOUNTER — Other Ambulatory Visit: Payer: Self-pay | Admitting: Nurse Practitioner

## 2024-01-18 DIAGNOSIS — K219 Gastro-esophageal reflux disease without esophagitis: Secondary | ICD-10-CM

## 2024-01-30 ENCOUNTER — Telehealth: Payer: Self-pay

## 2024-01-30 NOTE — Telephone Encounter (Signed)
 Copied from CRM [id number]. Topic: General - Other >> Jan 27, 2024 11:12 AM Susanna ORN wrote: Reason for CRM: Patient states that she saw Harlene An on 01/09/24 for a really bad tooth infection. States that also during that time, she ended up with the flu. Therefore, patient was out of work for some days and now her job is needing a note from the provider for the days of her being absent. Patient states she accrued points for each day and can't have more than 5 points or she risks being terminated. Almira is requesting a letter from Harlene An stating that she was seen by her & that patient was out of work due to sickness for the following days, 01/17/24 & then for the flu that she ended up having on 01/12-01/15/26. Please give patient a call to let her know if she can obtain a note for this issue. CB #: [id number].

## 2024-02-01 NOTE — Telephone Encounter (Signed)
 I can write her a note for the visit for the dental abscess- she was not seen for the flu

## 2024-02-03 NOTE — Telephone Encounter (Signed)
 Patient agreed and that her husband will be able to pick up from the office tomorrow at the front desk.   Message sent to [name], NP and to Administration staff

## 2024-02-04 NOTE — Telephone Encounter (Signed)
 Letter completed and printed.

## 2024-04-09 ENCOUNTER — Ambulatory Visit: Payer: Medicare (Managed Care) | Admitting: Nurse Practitioner

## 2024-11-04 ENCOUNTER — Ambulatory Visit: Payer: Self-pay | Admitting: Nurse Practitioner
